# Patient Record
Sex: Female | Born: 1985
Health system: Southern US, Community
[De-identification: ages and names within clinical notes are randomized; demographics above are authoritative.]

## PROBLEM LIST (undated history)

## (undated) DIAGNOSIS — R0602 Shortness of breath: Secondary | ICD-10-CM

## (undated) DIAGNOSIS — IMO0002 Reserved for concepts with insufficient information to code with codable children: Secondary | ICD-10-CM

## (undated) DIAGNOSIS — M549 Dorsalgia, unspecified: Secondary | ICD-10-CM

## (undated) DIAGNOSIS — R600 Localized edema: Secondary | ICD-10-CM

## (undated) DIAGNOSIS — E039 Hypothyroidism, unspecified: Secondary | ICD-10-CM

## (undated) DIAGNOSIS — K219 Gastro-esophageal reflux disease without esophagitis: Secondary | ICD-10-CM

## (undated) DIAGNOSIS — R7303 Prediabetes: Secondary | ICD-10-CM

## (undated) DIAGNOSIS — A549 Gonococcal infection, unspecified: Secondary | ICD-10-CM

## (undated) DIAGNOSIS — M1990 Unspecified osteoarthritis, unspecified site: Secondary | ICD-10-CM

## (undated) DIAGNOSIS — F419 Anxiety disorder, unspecified: Secondary | ICD-10-CM

## (undated) DIAGNOSIS — I10 Essential (primary) hypertension: Secondary | ICD-10-CM

## (undated) DIAGNOSIS — R002 Palpitations: Secondary | ICD-10-CM

## (undated) DIAGNOSIS — K589 Irritable bowel syndrome without diarrhea: Secondary | ICD-10-CM

## (undated) DIAGNOSIS — F1010 Alcohol abuse, uncomplicated: Secondary | ICD-10-CM

## (undated) DIAGNOSIS — E559 Vitamin D deficiency, unspecified: Secondary | ICD-10-CM

## (undated) DIAGNOSIS — F32A Depression, unspecified: Secondary | ICD-10-CM

## (undated) DIAGNOSIS — E7849 Other hyperlipidemia: Secondary | ICD-10-CM

## (undated) DIAGNOSIS — F1910 Other psychoactive substance abuse, uncomplicated: Secondary | ICD-10-CM

## (undated) HISTORY — DX: Other hyperlipidemia: E78.49

## (undated) HISTORY — DX: Dorsalgia, unspecified: M54.9

## (undated) HISTORY — DX: Gastro-esophageal reflux disease without esophagitis: K21.9

## (undated) HISTORY — DX: Depression, unspecified: F32.A

## (undated) HISTORY — DX: Shortness of breath: R06.02

## (undated) HISTORY — DX: Essential (primary) hypertension: I10

## (undated) HISTORY — DX: Prediabetes: R73.03

## (undated) HISTORY — DX: Anxiety disorder, unspecified: F41.9

## (undated) HISTORY — DX: Vitamin D deficiency, unspecified: E55.9

## (undated) HISTORY — DX: Other psychoactive substance abuse, uncomplicated: F19.10

## (undated) HISTORY — DX: Irritable bowel syndrome, unspecified: K58.9

## (undated) HISTORY — DX: Palpitations: R00.2

## (undated) HISTORY — PX: OTHER SURGICAL HISTORY: SHX169

## (undated) HISTORY — DX: Alcohol abuse, uncomplicated: F10.10

## (undated) HISTORY — DX: Localized edema: R60.0

---
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
## 2005-03-31 DIAGNOSIS — A549 Gonococcal infection, unspecified: Secondary | ICD-10-CM

## 2005-03-31 HISTORY — DX: Gonococcal infection, unspecified: A54.9

## 2005-05-26 ENCOUNTER — Inpatient Hospital Stay (HOSPITAL_COMMUNITY): Admission: AD | Admit: 2005-05-26 | Discharge: 2005-05-26 | Payer: Self-pay | Admitting: Obstetrics & Gynecology

## 2005-08-15 ENCOUNTER — Inpatient Hospital Stay (HOSPITAL_COMMUNITY): Admission: AD | Admit: 2005-08-15 | Discharge: 2005-08-15 | Payer: Self-pay | Admitting: Obstetrics and Gynecology

## 2005-09-18 ENCOUNTER — Inpatient Hospital Stay (HOSPITAL_COMMUNITY): Admission: AD | Admit: 2005-09-18 | Discharge: 2005-09-18 | Payer: Self-pay | Admitting: Obstetrics and Gynecology

## 2005-10-19 ENCOUNTER — Inpatient Hospital Stay (HOSPITAL_COMMUNITY): Admission: AD | Admit: 2005-10-19 | Discharge: 2005-10-19 | Payer: Self-pay | Admitting: Obstetrics & Gynecology

## 2005-10-22 ENCOUNTER — Inpatient Hospital Stay (HOSPITAL_COMMUNITY): Admission: AD | Admit: 2005-10-22 | Discharge: 2005-10-22 | Payer: Self-pay | Admitting: Obstetrics and Gynecology

## 2005-11-14 ENCOUNTER — Inpatient Hospital Stay (HOSPITAL_COMMUNITY): Admission: AD | Admit: 2005-11-14 | Discharge: 2005-11-16 | Payer: Self-pay | Admitting: Obstetrics and Gynecology

## 2007-01-29 ENCOUNTER — Emergency Department (HOSPITAL_COMMUNITY): Admission: EM | Admit: 2007-01-29 | Discharge: 2007-01-29 | Payer: Self-pay | Admitting: Emergency Medicine

## 2009-08-31 ENCOUNTER — Encounter: Admission: RE | Admit: 2009-08-31 | Discharge: 2009-08-31 | Payer: Self-pay | Admitting: Family Medicine

## 2010-08-16 NOTE — Procedures (Signed)
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  The second was 2  weeks ago when a friend of hers had her tongue pierced.  Both episodes were  associated with urinary incontinence and stiffening and lasted for up to 6  minutes.   PROCEDURE:  The patient is carried out of 32-channel digital [place]well  recorder reformatted to 16-channel montages with one devoted to EKG.  The  patient was awake and asleep during the recording.  The International 10/20  system lead placement was used.   FINDINGS:  Dominant frequency is well-modulated and regulated 10 Hz activity  of 15-20 mcV that  is broadly distributed and attenuates partially with eye  opening.   Background activity shows a mixture of predominantly alpha and beta range  activity when the patient is  awake. The patient becomes drowsy with mixed  frequency theta and upper delta range activity that predominates.  Light  natural sleep as achieved with vertex sharp waves and symmetric and  synchronous sleep spindles.   There was no focal slowing.  There was no interictal epileptiform activity  in the form of spikes or sharp waves.  Activating procedures with  intermittent photic stimulation induced to sustained driving response.  Hyperventilation caused mild potentiation in background.  There was no  seizure activity in this record.  EKG showed regular sinus rhythm with  ventricular response of 78 beats per minute.   IMPRESSION:  Normal record with the patient awake and asleep.      VQQ:VZDG  D:  05/17/2004 00:05:28  T:  05/17/2004 10:18:23  Job #:  [id number]   cc:   Genene Churn. Love, M.D.  [address]  [address]  [place]  [place] [address]  Fax: [phone]

## 2011-03-19 ENCOUNTER — Other Ambulatory Visit (HOSPITAL_COMMUNITY): Payer: Self-pay | Admitting: Obstetrics and Gynecology

## 2011-03-19 DIAGNOSIS — O99210 Obesity complicating pregnancy, unspecified trimester: Secondary | ICD-10-CM

## 2011-04-01 NOTE — L&D Delivery Note (Signed)
Delivery Note At 6:04 PM a viable female was delivered via Vaginal, Spontaneous Delivery (Presentation: Right Occiput Anterior).  APGAR: 9, 9; weight .   Placenta status: Intact, Spontaneous.  Cord: 3 vessels   Pt quickly progressed in labor.  With about 15 minutes of push the patient delivered a vigorous female infant in the vertex presentation. The Infant was passed to the maternal abdomen and cried vigorously.  The placenta delivered spontaneously, intact with 3V. No lacerations required repair.   Anesthesia: Epidural  Episiotomy: None Lacerations: None Est. Blood Loss (mL): 300 cc   Mom to AICU.  Baby to nursery-stable.  Angela Moncayo H. 08/27/2011, 7:17 PM

## 2011-04-03 ENCOUNTER — Encounter (HOSPITAL_COMMUNITY): Payer: Self-pay

## 2011-04-03 ENCOUNTER — Ambulatory Visit (HOSPITAL_COMMUNITY)
Admission: RE | Admit: 2011-04-03 | Discharge: 2011-04-03 | Disposition: A | Discharge status: Home / Self Care | Payer: 59 | Source: Ambulatory Visit | Attending: Obstetrics and Gynecology | Admitting: Obstetrics and Gynecology

## 2011-04-03 DIAGNOSIS — Z1389 Encounter for screening for other disorder: Secondary | ICD-10-CM | POA: Insufficient documentation

## 2011-04-03 DIAGNOSIS — O358XX0 Maternal care for other (suspected) fetal abnormality and damage, not applicable or unspecified: Secondary | ICD-10-CM | POA: Insufficient documentation

## 2011-04-03 DIAGNOSIS — O99210 Obesity complicating pregnancy, unspecified trimester: Secondary | ICD-10-CM

## 2011-04-03 DIAGNOSIS — Z363 Encounter for antenatal screening for malformations: Secondary | ICD-10-CM | POA: Insufficient documentation

## 2011-04-03 DIAGNOSIS — E079 Disorder of thyroid, unspecified: Secondary | ICD-10-CM | POA: Insufficient documentation

## 2011-04-03 DIAGNOSIS — E669 Obesity, unspecified: Secondary | ICD-10-CM | POA: Insufficient documentation

## 2011-04-03 DIAGNOSIS — E039 Hypothyroidism, unspecified: Secondary | ICD-10-CM | POA: Insufficient documentation

## 2011-04-03 DIAGNOSIS — O99280 Endocrine, nutritional and metabolic diseases complicating pregnancy, unspecified trimester: Secondary | ICD-10-CM | POA: Insufficient documentation

## 2011-04-03 HISTORY — DX: Hypothyroidism, unspecified: E03.9

## 2011-04-03 HISTORY — DX: Irritable bowel syndrome without diarrhea: K58.9

## 2011-04-03 NOTE — Progress Notes (Signed)
Obstetric ultrasound performed today.  Please see report in [facility]. 

## 2011-04-07 ENCOUNTER — Other Ambulatory Visit: Payer: Self-pay

## 2011-05-15 ENCOUNTER — Ambulatory Visit (HOSPITAL_COMMUNITY): Payer: 59

## 2011-05-16 ENCOUNTER — Ambulatory Visit (HOSPITAL_COMMUNITY)
Admission: RE | Admit: 2011-05-16 | Discharge: 2011-05-16 | Disposition: A | Discharge status: Home / Self Care | Payer: 59 | Source: Ambulatory Visit | Attending: Obstetrics and Gynecology | Admitting: Obstetrics and Gynecology

## 2011-05-16 VITALS — BP 129/62 | HR 81 | Wt 275.0 lb

## 2011-05-16 DIAGNOSIS — E039 Hypothyroidism, unspecified: Secondary | ICD-10-CM | POA: Insufficient documentation

## 2011-05-16 DIAGNOSIS — O99210 Obesity complicating pregnancy, unspecified trimester: Secondary | ICD-10-CM

## 2011-05-16 DIAGNOSIS — E669 Obesity, unspecified: Secondary | ICD-10-CM | POA: Insufficient documentation

## 2011-05-16 DIAGNOSIS — E079 Disorder of thyroid, unspecified: Secondary | ICD-10-CM | POA: Insufficient documentation

## 2011-05-16 NOTE — Progress Notes (Signed)
Obstetric ultrasound performed today.    IUP at 24 weeks 3 days Appropriate interval growth Reassuring interval anatomy (some limited cardiac views) Normal amniotic fluid volume Resolved low lying placenta  Repeat ultrasound scheduled in 6 weeks to re evaluate fetal growth, anatomy, and amniotic fluid volume.

## 2011-06-27 ENCOUNTER — Ambulatory Visit (HOSPITAL_COMMUNITY)
Admission: RE | Admit: 2011-06-27 | Discharge: 2011-06-27 | Disposition: A | Discharge status: Home / Self Care | Payer: 59 | Source: Ambulatory Visit | Attending: Obstetrics and Gynecology | Admitting: Obstetrics and Gynecology

## 2011-06-27 VITALS — BP 118/54 | HR 89 | Wt 282.0 lb

## 2011-06-27 DIAGNOSIS — E039 Hypothyroidism, unspecified: Secondary | ICD-10-CM | POA: Insufficient documentation

## 2011-06-27 DIAGNOSIS — E079 Disorder of thyroid, unspecified: Secondary | ICD-10-CM | POA: Insufficient documentation

## 2011-06-27 DIAGNOSIS — O99210 Obesity complicating pregnancy, unspecified trimester: Secondary | ICD-10-CM

## 2011-06-27 DIAGNOSIS — E669 Obesity, unspecified: Secondary | ICD-10-CM | POA: Insufficient documentation

## 2011-06-27 NOTE — Progress Notes (Signed)
Obstetric ultrasound performed today.   IUP at 30 weeks 3 days Appropriate interval growth Reassuring interval anatomy (some limited cardiac views) Normal amniotic fluid volume  Please see full report in [facility].

## 2011-08-08 ENCOUNTER — Ambulatory Visit (HOSPITAL_COMMUNITY)
Admission: RE | Admit: 2011-08-08 | Discharge: 2011-08-08 | Disposition: A | Discharge status: Home / Self Care | Payer: 59 | Source: Ambulatory Visit | Attending: Obstetrics and Gynecology | Admitting: Obstetrics and Gynecology

## 2011-08-08 VITALS — BP 130/76 | HR 79 | Wt 294.0 lb

## 2011-08-08 DIAGNOSIS — E039 Hypothyroidism, unspecified: Secondary | ICD-10-CM | POA: Insufficient documentation

## 2011-08-08 DIAGNOSIS — O99210 Obesity complicating pregnancy, unspecified trimester: Secondary | ICD-10-CM

## 2011-08-08 DIAGNOSIS — E669 Obesity, unspecified: Secondary | ICD-10-CM | POA: Insufficient documentation

## 2011-08-08 DIAGNOSIS — O99280 Endocrine, nutritional and metabolic diseases complicating pregnancy, unspecified trimester: Secondary | ICD-10-CM | POA: Insufficient documentation

## 2011-08-08 DIAGNOSIS — E079 Disorder of thyroid, unspecified: Secondary | ICD-10-CM | POA: Insufficient documentation

## 2011-08-08 NOTE — Progress Notes (Signed)
Patient seen today  for follow up ultrasound.  See full report in [facility].  Alpha Gula, MD  IUP at 36 3/7 weeks The estimated fetal weight today is 3393 g (>90th percentile) Normal amniotic fluid volume  Recommend follow up as clinically indicated

## 2011-08-23 ENCOUNTER — Other Ambulatory Visit (HOSPITAL_COMMUNITY): Payer: Self-pay | Admitting: Obstetrics & Gynecology

## 2011-08-23 ENCOUNTER — Other Ambulatory Visit (HOSPITAL_COMMUNITY): Payer: Self-pay | Admitting: *Deleted

## 2011-08-23 DIAGNOSIS — O139 Gestational [pregnancy-induced] hypertension without significant proteinuria, unspecified trimester: Secondary | ICD-10-CM

## 2011-08-23 LAB — COMPREHENSIVE METABOLIC PANEL
ALT: 11 U/L (ref 0–35)
AST: 17 U/L (ref 0–37)
Albumin: 2.6 g/dL — ABNORMAL LOW (ref 3.5–5.2)
CO2: 23 mEq/L (ref 19–32)
Calcium: 9.4 mg/dL (ref 8.4–10.5)
Chloride: 101 mEq/L (ref 96–112)
Creatinine, Ser: 0.65 mg/dL (ref 0.50–1.10)
GFR calc non Af Amer: >90 mL/min (ref >90)
Sodium: 135 mEq/L (ref 135–145)
Total Bilirubin: 0.2 mg/dL — ABNORMAL LOW (ref 0.3–1.2)

## 2011-08-23 LAB — CBC
Platelets: 178 10*3/uL (ref 150–400)
RBC: 4.01 MIL/uL (ref 3.87–5.11)
RDW: 13.1 % (ref 11.5–15.5)
WBC: 9.4 10*3/uL (ref 4.0–10.5)

## 2011-08-27 ENCOUNTER — Inpatient Hospital Stay (HOSPITAL_COMMUNITY)
Admission: AD | Admit: 2011-08-27 | Discharge: 2011-08-29 | DRG: 774 | Disposition: A | Discharge status: Home / Self Care | Payer: 59 | Source: Ambulatory Visit | Attending: Obstetrics and Gynecology | Admitting: Obstetrics and Gynecology

## 2011-08-27 ENCOUNTER — Encounter (HOSPITAL_COMMUNITY): Payer: Self-pay | Admitting: Anesthesiology

## 2011-08-27 ENCOUNTER — Encounter (HOSPITAL_COMMUNITY): Payer: Self-pay | Admitting: *Deleted

## 2011-08-27 ENCOUNTER — Inpatient Hospital Stay (HOSPITAL_COMMUNITY): Payer: 59 | Admitting: Anesthesiology

## 2011-08-27 DIAGNOSIS — E669 Obesity, unspecified: Secondary | ICD-10-CM | POA: Diagnosis present

## 2011-08-27 DIAGNOSIS — IMO0002 Reserved for concepts with insufficient information to code with codable children: Principal | ICD-10-CM | POA: Diagnosis present

## 2011-08-27 DIAGNOSIS — O1490 Unspecified pre-eclampsia, unspecified trimester: Secondary | ICD-10-CM

## 2011-08-27 DIAGNOSIS — O99214 Obesity complicating childbirth: Secondary | ICD-10-CM | POA: Diagnosis present

## 2011-08-27 DIAGNOSIS — E039 Hypothyroidism, unspecified: Secondary | ICD-10-CM | POA: Diagnosis present

## 2011-08-27 DIAGNOSIS — E079 Disorder of thyroid, unspecified: Secondary | ICD-10-CM | POA: Diagnosis present

## 2011-08-27 HISTORY — DX: Gonococcal infection, unspecified: A54.9

## 2011-08-27 HISTORY — DX: Unspecified osteoarthritis, unspecified site: M19.90

## 2011-08-27 HISTORY — DX: Reserved for concepts with insufficient information to code with codable children: IMO0002

## 2011-08-27 LAB — COMPREHENSIVE METABOLIC PANEL
ALT: 15 U/L (ref 0–35)
AST: 20 U/L (ref 0–37)
Alkaline Phosphatase: 117 U/L (ref 39–117)
CO2: 25 mEq/L (ref 19–32)
Calcium: 9 mg/dL (ref 8.4–10.5)
Chloride: 99 mEq/L (ref 96–112)
GFR calc non Af Amer: >90 mL/min (ref >90)
Potassium: 4 mEq/L (ref 3.5–5.1)
Sodium: 133 mEq/L — ABNORMAL LOW (ref 135–145)
Total Bilirubin: 0.3 mg/dL (ref 0.3–1.2)

## 2011-08-27 LAB — URINALYSIS, ROUTINE W REFLEX MICROSCOPIC
Bilirubin Urine: NEGATIVE
Specific Gravity, Urine: >1.03 — ABNORMAL HIGH (ref 1.005–1.030)
Urobilinogen, UA: 0.2 mg/dL (ref 0.0–1.0)

## 2011-08-27 LAB — CBC
HCT: 36.3 % (ref 36.0–46.0)
MCH: 29.1 pg (ref 26.0–34.0)
MCHC: 33.6 g/dL (ref 30.0–36.0)
MCV: 86.6 fL (ref 78.0–100.0)
RDW: 13.3 % (ref 11.5–15.5)

## 2011-08-27 LAB — URINE MICROSCOPIC-ADD ON

## 2011-08-27 LAB — OB RESULTS CONSOLE HIV ANTIBODY (ROUTINE TESTING): HIV: NONREACTIVE

## 2011-08-27 LAB — URIC ACID: Uric Acid, Serum: 6 mg/dL (ref 2.4–7.0)

## 2011-08-27 LAB — OB RESULTS CONSOLE GC/CHLAMYDIA
Chlamydia: NEGATIVE
Gonorrhea: NEGATIVE

## 2011-08-27 LAB — OB RESULTS CONSOLE ABO/RH: RH Type: POSITIVE

## 2011-08-27 LAB — OB RESULTS CONSOLE HEPATITIS B SURFACE ANTIGEN: Hepatitis B Surface Ag: NEGATIVE

## 2011-08-27 LAB — ABO/RH: ABO/RH(D): A POS

## 2011-08-27 MED ORDER — LIDOCAINE HCL (PF) 1 % IJ SOLN
INTRAMUSCULAR | Status: DC | PRN
  Administered 2011-08-27 (×2): 8 mL

## 2011-08-27 MED ORDER — TETANUS-DIPHTH-ACELL PERTUSSIS 5-2.5-18.5 LF-MCG/0.5 IM SUSP
0.5000 mL | Freq: Once | INTRAMUSCULAR | Status: AC
  Administered 2011-08-28: 0.5 mL via INTRAMUSCULAR
  Filled 2011-08-27: qty 0.5

## 2011-08-27 MED ORDER — LANOLIN HYDROUS EX OINT: TOPICAL_OINTMENT | CUTANEOUS | Status: DC | PRN

## 2011-08-27 MED ORDER — IBUPROFEN 600 MG PO TABS: 600.0000 mg | ORAL_TABLET | Freq: Four times a day (QID) | ORAL | Status: DC | PRN

## 2011-08-27 MED ORDER — FENTANYL 2.5 MCG/ML BUPIVACAINE 1/10 % EPIDURAL INFUSION (WH - ANES)
14.0000 mL/h | INTRAMUSCULAR | Status: DC
  Filled 2011-08-27: qty 60

## 2011-08-27 MED ORDER — DIPHENHYDRAMINE HCL 25 MG PO CAPS: 25.0000 mg | ORAL_CAPSULE | Freq: Four times a day (QID) | ORAL | Status: DC | PRN

## 2011-08-27 MED ORDER — SENNOSIDES-DOCUSATE SODIUM 8.6-50 MG PO TABS: 2.0000 | ORAL_TABLET | Freq: Every day | ORAL | Status: DC

## 2011-08-27 MED ORDER — ONDANSETRON HCL 4 MG PO TABS: 4.0000 mg | ORAL_TABLET | ORAL | Status: DC | PRN

## 2011-08-27 MED ORDER — CITRIC ACID-SODIUM CITRATE 334-500 MG/5ML PO SOLN: 30.0000 mL | ORAL | Status: DC | PRN

## 2011-08-27 MED ORDER — DIPHENHYDRAMINE HCL 50 MG/ML IJ SOLN: 12.5000 mg | INTRAMUSCULAR | Status: DC | PRN

## 2011-08-27 MED ORDER — FENTANYL 2.5 MCG/ML BUPIVACAINE 1/10 % EPIDURAL INFUSION (WH - ANES)
INTRAMUSCULAR | Status: DC | PRN
  Administered 2011-08-27: 12 mL/h via EPIDURAL

## 2011-08-27 MED ORDER — PHENYLEPHRINE 40 MCG/ML (10ML) SYRINGE FOR IV PUSH (FOR BLOOD PRESSURE SUPPORT)
80.0000 ug | PREFILLED_SYRINGE | INTRAVENOUS | Status: DC | PRN
  Filled 2011-08-27: qty 5

## 2011-08-27 MED ORDER — OXYTOCIN 20 UNITS IN LACTATED RINGERS INFUSION - SIMPLE
1.0000 m[IU]/min | INTRAVENOUS | Status: DC
  Administered 2011-08-27: 2 m[IU]/min via INTRAVENOUS
  Filled 2011-08-27: qty 1000

## 2011-08-27 MED ORDER — LACTATED RINGERS IV SOLN: 500.0000 mL | Freq: Once | INTRAVENOUS | Status: DC

## 2011-08-27 MED ORDER — OXYTOCIN 20 UNITS IN LACTATED RINGERS INFUSION - SIMPLE
125.0000 mL/h | Freq: Once | INTRAVENOUS | Status: DC
  Administered 2011-08-27: 999 mL/h via INTRAVENOUS

## 2011-08-27 MED ORDER — PRENATAL MULTIVITAMIN CH
1.0000 | ORAL_TABLET | Freq: Every day | ORAL | Status: DC
  Administered 2011-08-28 – 2011-08-29 (×2): 1 via ORAL
  Filled 2011-08-27 (×2): qty 1

## 2011-08-27 MED ORDER — OXYCODONE-ACETAMINOPHEN 5-325 MG PO TABS
1.0000 | ORAL_TABLET | ORAL | Status: DC | PRN
  Administered 2011-08-27 – 2011-08-29 (×3): 1 via ORAL
  Filled 2011-08-27 (×4): qty 1

## 2011-08-27 MED ORDER — EPHEDRINE 5 MG/ML INJ
10.0000 mg | INTRAVENOUS | Status: DC | PRN
  Filled 2011-08-27: qty 4

## 2011-08-27 MED ORDER — BENZOCAINE-MENTHOL 20-0.5 % EX AERO: 1.0000 "application " | INHALATION_SPRAY | CUTANEOUS | Status: DC | PRN

## 2011-08-27 MED ORDER — ZOLPIDEM TARTRATE 5 MG PO TABS: 5.0000 mg | ORAL_TABLET | Freq: Every evening | ORAL | Status: DC | PRN

## 2011-08-27 MED ORDER — TERBUTALINE SULFATE 1 MG/ML IJ SOLN: 0.2500 mg | Freq: Once | INTRAMUSCULAR | Status: DC | PRN

## 2011-08-27 MED ORDER — ONDANSETRON HCL 4 MG/2ML IJ SOLN: 4.0000 mg | Freq: Four times a day (QID) | INTRAMUSCULAR | Status: DC | PRN

## 2011-08-27 MED ORDER — LACTATED RINGERS IV SOLN
500.0000 mL | INTRAVENOUS | Status: DC | PRN
  Administered 2011-08-27: 1000 mL via INTRAVENOUS

## 2011-08-27 MED ORDER — BUTORPHANOL TARTRATE 2 MG/ML IJ SOLN: 1.0000 mg | INTRAMUSCULAR | Status: DC | PRN

## 2011-08-27 MED ORDER — IBUPROFEN 600 MG PO TABS
600.0000 mg | ORAL_TABLET | Freq: Four times a day (QID) | ORAL | Status: DC
  Administered 2011-08-27 – 2011-08-29 (×8): 600 mg via ORAL
  Filled 2011-08-27 (×8): qty 1

## 2011-08-27 MED ORDER — SIMETHICONE 80 MG PO CHEW: 80.0000 mg | CHEWABLE_TABLET | ORAL | Status: DC | PRN

## 2011-08-27 MED ORDER — FLEET ENEMA 7-19 GM/118ML RE ENEM: 1.0000 | ENEMA | RECTAL | Status: DC | PRN

## 2011-08-27 MED ORDER — MAGNESIUM SULFATE BOLUS VIA INFUSION
6.0000 g | Freq: Once | INTRAVENOUS | Status: DC
  Filled 2011-08-27: qty 500

## 2011-08-27 MED ORDER — MAGNESIUM SULFATE 40 G IN LACTATED RINGERS - SIMPLE
2.0000 g/h | INTRAVENOUS | Status: DC
  Administered 2011-08-27: 6 g/h via INTRAVENOUS
  Administered 2011-08-28: 2 g/h via INTRAVENOUS
  Filled 2011-08-27 (×2): qty 500

## 2011-08-27 MED ORDER — WITCH HAZEL-GLYCERIN EX PADS: 1.0000 "application " | MEDICATED_PAD | CUTANEOUS | Status: DC | PRN

## 2011-08-27 MED ORDER — LACTATED RINGERS IV SOLN
INTRAVENOUS | Status: DC
  Administered 2011-08-28 (×2): via INTRAVENOUS

## 2011-08-27 MED ORDER — ZOLPIDEM TARTRATE 10 MG PO TABS: 10.0000 mg | ORAL_TABLET | Freq: Every evening | ORAL | Status: DC | PRN

## 2011-08-27 MED ORDER — DIBUCAINE 1 % RE OINT: 1.0000 "application " | TOPICAL_OINTMENT | RECTAL | Status: DC | PRN

## 2011-08-27 MED ORDER — LORATADINE 10 MG PO TABS
10.0000 mg | ORAL_TABLET | Freq: Every day | ORAL | Status: DC
  Administered 2011-08-28 – 2011-08-29 (×2): 10 mg via ORAL
  Filled 2011-08-27 (×3): qty 1

## 2011-08-27 MED ORDER — ACETAMINOPHEN 325 MG PO TABS: 650.0000 mg | ORAL_TABLET | ORAL | Status: DC | PRN

## 2011-08-27 MED ORDER — ONDANSETRON HCL 4 MG/2ML IJ SOLN: 4.0000 mg | INTRAMUSCULAR | Status: DC | PRN

## 2011-08-27 MED ORDER — LEVOTHYROXINE SODIUM 25 MCG PO TABS
25.0000 ug | ORAL_TABLET | Freq: Every day | ORAL | Status: DC
  Administered 2011-08-28 – 2011-08-29 (×2): 25 ug via ORAL
  Filled 2011-08-27 (×3): qty 1

## 2011-08-27 MED ORDER — PHENYLEPHRINE 40 MCG/ML (10ML) SYRINGE FOR IV PUSH (FOR BLOOD PRESSURE SUPPORT): 80.0000 ug | PREFILLED_SYRINGE | INTRAVENOUS | Status: DC | PRN

## 2011-08-27 MED ORDER — OXYTOCIN BOLUS FROM INFUSION
500.0000 mL | Freq: Once | INTRAVENOUS | Status: DC
  Filled 2011-08-27: qty 500

## 2011-08-27 MED ORDER — EPHEDRINE 5 MG/ML INJ: 10.0000 mg | INTRAVENOUS | Status: DC | PRN

## 2011-08-27 MED ORDER — LACTATED RINGERS IV SOLN
INTRAVENOUS | Status: DC
  Administered 2011-08-27 (×2): via INTRAVENOUS

## 2011-08-27 MED ORDER — LIDOCAINE HCL (PF) 1 % IJ SOLN: 30.0000 mL | INTRAMUSCULAR | Status: DC | PRN

## 2011-08-27 MED ORDER — OXYCODONE-ACETAMINOPHEN 5-325 MG PO TABS: 1.0000 | ORAL_TABLET | ORAL | Status: DC | PRN

## 2011-08-27 NOTE — Progress Notes (Signed)
Patient ID: Angela Johnson, female   DOB: August 30, 1985, 26 y.o.   MRN: [id number]  S: Now comfortable with epidural  O: Filed Vitals:   08/27/11 1649 08/27/11 1651 08/27/11 1656 08/27/11 1701  BP: 153/88 154/88 146/87 147/89  Pulse: 84 83 79 84  Temp:      TempSrc:      Resp:      Height:      Weight:      SpO2:       AOx3 Cvx 5/C/0 toco Q 2 FHT 140 reactive SROM while waiting for epidural  A/P: 1) Cont pit 2) Cont Mag 3) FWB reassuring

## 2011-08-27 NOTE — H&P (Signed)
  CC: Leaking fluid HPI: 26 yo G3P1011 p/w c/o leaking fluid. Upon presentation, the patient was not found to have SROM, however her BPs were elevated.  The patient had been seen over the weekend for elevated BPs, had pre-eclampsia labs performed, and a 24 hour urine.  Labs returned WNL, however the patients 24 hour urine was 570 mg protein.  Given elevated BPs and 24 hour urine results will admit for induction.  Will administer magnesium for seizure prophylaxis. PMH: 1) Obesity 2) Hypothyroidism PSH: None POB: G3P1011  2006: first trimester EAB  2007: 40+ svd 10#2 PGYN: No abn paps, h/o GC 2007 Meds: 1) PNV All: Latex PNL: A+, Rh neg, RPR NR, RI, HepBsAg neg, HIV NR, HgA1C 5.0, 1hr gtt 113, GC neg, Ch neg, GBS neg, declined genetic screening, pap WNL PE:  Filed Vitals:   08/27/11 1134  BP: 160/98  Pulse: 90  Temp:   Resp: 18    AOX3, NAD Gravid, soft, NT FHT 150 reactive Cvx 3-4/70/-2, post Toco: irreg CTAB RRR DTRs 2+, no clonus A/P:  1) Admit 2) Pre-E labs 3) Induction for pre-eclampsia, start magnesium for seizure prophylaxis 6gm load then 2gm q hr 4) Start pitocin, arom when able 5) Epidural on request

## 2011-08-27 NOTE — Anesthesia Procedure Notes (Signed)
Epidural Patient location during procedure: OB Start time: 08/27/2011 4:30 PM End time: 08/27/2011 4:36 PM Reason for block: procedure for pain  Staffing Anesthesiologist: Sandrea Hughs Performed by: anesthesiologist   Preanesthetic Checklist Completed: patient identified, site marked, surgical consent, pre-op evaluation, timeout performed, IV checked, risks and benefits discussed and monitors and equipment checked  Epidural Patient position: sitting Prep: site prepped and draped and DuraPrep Patient monitoring: continuous pulse ox and blood pressure Approach: midline Injection technique: LOR air  Needle:  Needle type: Tuohy  Needle gauge: 17 G Needle length: 9 cm Needle insertion depth: 6 cm Catheter type: closed end flexible Catheter size: 19 Gauge Catheter at skin depth: 12 cm Test dose: negative and Other  Assessment Sensory level: T10 Events: blood not aspirated, injection not painful, no injection resistance, negative IV test and no paresthesia

## 2011-08-27 NOTE — MAU Note (Signed)
BS CN notified of patient status. Will be triaged in 173.

## 2011-08-27 NOTE — Progress Notes (Signed)
Additional rn assistance requested

## 2011-08-27 NOTE — Anesthesia Preprocedure Evaluation (Signed)
Anesthesia Evaluation  Patient identified by MRN, date of birth, ID band Patient awake    Reviewed: Allergy & Precautions, H&P , NPO status , Patient's Chart, lab work & pertinent test results  Airway Mallampati: III TM Distance: >3 FB Neck ROM: full    Dental No notable dental hx.    Pulmonary neg pulmonary ROS,  breath sounds clear to auscultation  Pulmonary exam normal       Cardiovascular negative cardio ROS      Neuro/Psych negative neurological ROS  negative psych ROS   GI/Hepatic negative GI ROS, Neg liver ROS,   Endo/Other  Hypothyroidism Morbid obesity  Renal/GU negative Renal ROS  negative genitourinary   Musculoskeletal negative musculoskeletal ROS (+)   Abdominal (+) + obese,   Peds negative pediatric ROS (+)  Hematology negative hematology ROS (+)   Anesthesia Other Findings   Reproductive/Obstetrics (+) Pregnancy                           Anesthesia Physical Anesthesia Plan  ASA: III  Anesthesia Plan: Epidural   Post-op Pain Management:    Induction:   Airway Management Planned:   Additional Equipment:   Intra-op Plan:   Post-operative Plan:   Informed Consent: I have reviewed the patients History and Physical, chart, labs and discussed the procedure including the risks, benefits and alternatives for the proposed anesthesia with the patient or authorized representative who has indicated his/her understanding and acceptance.     Plan Discussed with:   Anesthesia Plan Comments:         Anesthesia Quick Evaluation

## 2011-08-27 NOTE — Progress Notes (Signed)
efm d/cd per anesthesia  

## 2011-08-27 NOTE — MAU Note (Signed)
Patient states she started leaking a small amount of clear fluid at 0740. Has continued to leak clear fluid. Having contractions 11-15 minutes apart. Reports good fetal movement, no bleeding.

## 2011-08-28 LAB — COMPREHENSIVE METABOLIC PANEL
ALT: 10 U/L (ref 0–35)
AST: 17 U/L (ref 0–37)
Alkaline Phosphatase: 101 U/L (ref 39–117)
CO2: 25 mEq/L (ref 19–32)
Chloride: 102 mEq/L (ref 96–112)
GFR calc non Af Amer: >90 mL/min (ref >90)
Sodium: 135 mEq/L (ref 135–145)
Total Bilirubin: 0.2 mg/dL — ABNORMAL LOW (ref 0.3–1.2)

## 2011-08-28 LAB — CBC
Hemoglobin: 9.6 g/dL — ABNORMAL LOW (ref 12.0–15.0)
Hemoglobin: 9.6 g/dL — ABNORMAL LOW (ref 12.0–15.0)
MCHC: 32.5 g/dL (ref 30.0–36.0)
Platelets: 158 10*3/uL (ref 150–400)
RBC: 3.35 MIL/uL — ABNORMAL LOW (ref 3.87–5.11)
RDW: 13.5 % (ref 11.5–15.5)
WBC: 14.1 10*3/uL — ABNORMAL HIGH (ref 4.0–10.5)
WBC: 11.1 10*3/uL — ABNORMAL HIGH (ref 4.0–10.5)

## 2011-08-28 MED ORDER — LABETALOL HCL 5 MG/ML IV SOLN
10.0000 mg | INTRAVENOUS | Status: DC | PRN
  Filled 2011-08-28: qty 4

## 2011-08-28 NOTE — Progress Notes (Signed)
UR chart review completed.  

## 2011-08-28 NOTE — Anesthesia Postprocedure Evaluation (Signed)
  Anesthesia Post-op Note  Patient: Angela Johnson  Procedure(s) Performed: * No procedures listed *  Patient Location: A-ICU  Anesthesia Type: Epidural  Level of Consciousness: awake  Airway and Oxygen Therapy: Patient connected to nasal cannula oxygen  Post-op Pain: none  Post-op Assessment: Post-op Vital signs reviewed, PATIENT'S CARDIOVASCULAR STATUS UNSTABLE, Respiratory Function Stable, Patent Airway, No signs of Nausea or vomiting, Adequate PO intake, Pain level controlled, No headache, No backache, No residual numbness and No residual motor weakness  Post-op Vital Signs: Reviewed and stable  Complications: No apparent anesthesia complications

## 2011-08-28 NOTE — Progress Notes (Signed)
Report called to Tammy, RN on [facility]. Pt to room 141 via wheel chair with E. Toms, Charity fundraiser.

## 2011-08-29 NOTE — Discharge Summary (Signed)
Obstetric Discharge Summary Reason for Admission: induction of labor, preeclampsia Prenatal Procedures: ultrasound Intrapartum Procedures: spontaneous vaginal delivery Postpartum Procedures: none Complications-Operative and Postpartum: none Hemoglobin  Date Value Range Status  08/28/2011 9.6* 12.0-15.0 (g/dL) Final     HCT  Date Value Range Status  08/28/2011 29.2* 36.0-46.0 (%) Final    Physical Exam:  General: alert Lochia: appropriate Uterine Fundus: firm  Discharge Diagnoses: Term Pregnancy-delivered and Preelampsia  Discharge Information: Date: 08/29/2011 Activity: pelvic rest Diet: routine Medications: PNV and Ibuprofen Condition: improved Instructions: refer to practice specific booklet Discharge to: home Follow-up Information    Follow up with Almon Hercules., MD in 4 weeks.   Contact information:   [address] [place] [address] [phone]          Newborn Data: Live born female  Birth Weight: 7 lb 14.1 oz (3575 g) APGAR: 9, 9  Home with mother.  Lavert Matousek D 08/29/2011, 10:59 AM

## 2011-08-29 NOTE — Discharge Instructions (Signed)
Vaginal Delivery Care After  Change your pad on each trip to the bathroom.   Wipe gently with toilet paper during your hospital stay. Always wipe from front to back. A spray bottle with warm tap water could also be used or a towelette if available.   Place your soiled pad and toilet paper in a bathroom wastebasket with a plastic bag liner.   During your hospital stay, save any clots. If you pass a clot while on the toilet, do not flush it. Also, if your vaginal flow seems excessive to you, notify nursing personnel.   The first time you get out of bed after delivery, wait for assistance from a nurse. Do not get up alone at any time if you feel weak or dizzy.   Bend and extend your ankles forcefully so that you feel the calves of your legs get hard. Do this 6 times every hour when you are in bed and awake.   Do not sit with one foot under you, dangle your legs over the edge of the bed, or maintain a position that hinders the circulation in your legs.   Many women experience after pains for 2 to 3 days after delivery. These after pains are mild uterine contractions. Ask the nurse for a pain medication if you need something for this. Sometimes breastfeeding stimulates after pains; if you find this to be true, ask for the medication  -  hour before the next feeding.   For you and your infant's protection, do not go beyond the door(s) of the obstetric unit. Do not carry your baby in your arms in the hallway. When taking your baby to and from your room, put your baby in the bassinet and push the bassinet.   Mothers may have their babies in their room as much as they desire.  Document Released: 03/14/2000 Document Revised: 03/06/2011 Document Reviewed: 02/12/2007 ExitCare Patient Information 2012 [employer]. 

## 2012-07-05 ENCOUNTER — Telehealth: Payer: Self-pay | Admitting: Nurse Practitioner

## 2012-07-05 NOTE — Telephone Encounter (Signed)
Patient aware.

## 2012-10-12 ENCOUNTER — Other Ambulatory Visit (INDEPENDENT_AMBULATORY_CARE_PROVIDER_SITE_OTHER): Payer: 59

## 2012-10-12 DIAGNOSIS — E039 Hypothyroidism, unspecified: Secondary | ICD-10-CM

## 2012-10-12 LAB — THYROID PANEL WITH TSH
Free Thyroxine Index: 2.7 (ref 1.0–3.9)
T3 Uptake: 24.4 % (ref 22.5–37.0)
T4, Total: 10.9 ug/dL (ref 5.0–12.5)
TSH: 3.958 u[IU]/mL (ref 0.350–4.500)

## 2012-11-18 ENCOUNTER — Other Ambulatory Visit: Payer: Self-pay

## 2012-11-18 MED ORDER — LEVOTHYROXINE SODIUM 25 MCG PO TABS: 25.0000 ug | ORAL_TABLET | Freq: Every day | ORAL | Status: DC

## 2012-12-06 ENCOUNTER — Ambulatory Visit (INDEPENDENT_AMBULATORY_CARE_PROVIDER_SITE_OTHER): Payer: BC Managed Care – PPO | Admitting: Family Medicine

## 2012-12-06 ENCOUNTER — Telehealth: Payer: Self-pay | Admitting: Nurse Practitioner

## 2012-12-06 ENCOUNTER — Encounter: Payer: Self-pay | Admitting: Family Medicine

## 2012-12-06 VITALS — BP 137/84 | HR 73 | Temp 97.2°F | Ht 70.0 in | Wt 293.0 lb

## 2012-12-06 DIAGNOSIS — J019 Acute sinusitis, unspecified: Secondary | ICD-10-CM

## 2012-12-06 DIAGNOSIS — J3489 Other specified disorders of nose and nasal sinuses: Secondary | ICD-10-CM

## 2012-12-06 DIAGNOSIS — J029 Acute pharyngitis, unspecified: Secondary | ICD-10-CM

## 2012-12-06 LAB — POCT RAPID STREP A (OFFICE): Rapid Strep A Screen: NEGATIVE

## 2012-12-06 MED ORDER — AMOXICILLIN 500 MG PO CAPS: ORAL_CAPSULE | ORAL | Status: DC

## 2012-12-06 NOTE — Progress Notes (Signed)
  Subjective:    Patient ID: Angela Johnson, female    DOB: [birth year], 27 y.o.   MRN: [id number]  HPI Patient comes in today with a less than 24-hour complaint of sinus pressure, headache, myalgias, and sore throat. She has also had fever appear   Review of Systems  Constitutional: Positive for fever and chills.  HENT: Positive for ear pain, congestion, sore throat, postnasal drip and sinus pressure.   Eyes: Positive for itching.  Respiratory: Negative.   Cardiovascular: Negative.   Gastrointestinal: Negative.   Endocrine: Negative.   Genitourinary: Negative.   Musculoskeletal: Positive for myalgias.  Skin: Negative.   Allergic/Immunologic: Negative.   Neurological: Positive for headaches.  Hematological: Negative.   Psychiatric/Behavioral: Negative.        Objective:   Physical Exam  Vitals reviewed. Constitutional: She is oriented to person, place, and time. She appears well-developed and well-nourished.  HENT:  Head: Normocephalic and atraumatic.  Right Ear: External ear normal.  Left Ear: External ear normal.  Mouth/Throat: Oropharynx is clear and moist. No oropharyngeal exudate.  Nasal congestion bilaterally  Eyes: Conjunctivae and EOM are normal. Pupils are equal, round, and reactive to light. Right eye exhibits no discharge. Left eye exhibits no discharge. No scleral icterus.  Neck: Normal range of motion. Neck supple. No thyromegaly present.  Anterior cervical tenderness bilaterally  Cardiovascular: Normal rate, regular rhythm and normal heart sounds.  Exam reveals no gallop and no friction rub.   No murmur heard. Pulmonary/Chest: Effort normal and breath sounds normal. No respiratory distress. She has no wheezes. She has no rales.  Lymphadenopathy:    She has no cervical adenopathy.  Neurological: She is alert and oriented to person, place, and time.  Skin: Skin is warm and dry. No rash noted. No erythema.  Psychiatric: She has a normal mood and affect. Her  behavior is normal. Judgment and thought content normal.   Results for orders placed in visit on 12/06/12  POCT RAPID STREP A (OFFICE)      Result Value Range   Rapid Strep A Screen Negative  Negative          Assessment & Plan:  Acute rhinosinusitis - Plan: amoxicillin (AMOXIL) 500 MG capsule  Sore throat - Plan: POCT rapid strep A, Strep A culture, throat, amoxicillin (AMOXIL) 500 MG capsule, CANCELED: Upper Respiratory Culture, Routine  Sinus pressure  Patient Instructions  Take antibiotic regularly Take Tylenol or Advil for aches pains and fever Drink plenty of fluids Get plenty of rest   Nyra Capes MD

## 2012-12-06 NOTE — Patient Instructions (Signed)
Take antibiotic regularly Take Tylenol or Advil for aches pains and fever Drink plenty of fluids Get plenty of rest

## 2012-12-06 NOTE — Telephone Encounter (Signed)
Appt given for today 

## 2012-12-09 LAB — STREP A CULTURE, THROAT

## 2012-12-27 ENCOUNTER — Telehealth: Payer: Self-pay | Admitting: *Deleted

## 2012-12-27 NOTE — Telephone Encounter (Signed)
Message copied by Baltazar Apo on Mon Dec 27, 2012  9:03 AM ------      Message from: Ernestina Penna      Created: Thu Dec 09, 2012  7:23 AM       Strep was isolated but not group A. Continue and complete analog ------

## 2013-01-05 ENCOUNTER — Encounter (INDEPENDENT_AMBULATORY_CARE_PROVIDER_SITE_OTHER): Payer: Self-pay

## 2013-01-05 ENCOUNTER — Ambulatory Visit (INDEPENDENT_AMBULATORY_CARE_PROVIDER_SITE_OTHER): Payer: BC Managed Care – PPO | Admitting: General Practice

## 2013-01-05 ENCOUNTER — Encounter: Payer: Self-pay | Admitting: General Practice

## 2013-01-05 ENCOUNTER — Ambulatory Visit: Payer: BC Managed Care – PPO

## 2013-01-05 ENCOUNTER — Ambulatory Visit (INDEPENDENT_AMBULATORY_CARE_PROVIDER_SITE_OTHER): Payer: BC Managed Care – PPO

## 2013-01-05 ENCOUNTER — Other Ambulatory Visit: Payer: Self-pay | Admitting: General Practice

## 2013-01-05 VITALS — BP 131/83 | HR 82 | Temp 98.8°F | Ht 70.0 in | Wt 295.0 lb

## 2013-01-05 DIAGNOSIS — M25539 Pain in unspecified wrist: Secondary | ICD-10-CM

## 2013-01-05 DIAGNOSIS — M25531 Pain in right wrist: Secondary | ICD-10-CM

## 2013-01-05 DIAGNOSIS — R52 Pain, unspecified: Secondary | ICD-10-CM

## 2013-01-05 NOTE — Progress Notes (Signed)
Patient ID: Angela Johnson, female   DOB: Apr 25, 1985, 27 y.o.   MRN: [id number]  Patient presents today for right arm xray, due to pain after fall yesterday.  [facility] reading (PRIMARY) by Coralie Keens, FNP-C, Right Radial head fracture. Referral to ortho specialist, appointment scheduled for today Patient aware Arm sling declined by patient Coralie Keens, FNP-C

## 2013-05-12 ENCOUNTER — Other Ambulatory Visit: Payer: Self-pay | Admitting: Nurse Practitioner

## 2013-05-12 MED ORDER — OSELTAMIVIR PHOSPHATE 75 MG PO CAPS: 75.0000 mg | ORAL_CAPSULE | Freq: Every day | ORAL | Status: DC

## 2013-11-22 ENCOUNTER — Other Ambulatory Visit: Payer: Self-pay | Admitting: Family Medicine

## 2013-11-24 NOTE — Telephone Encounter (Signed)
Last thyroid 7/14. ntbs

## 2013-11-24 NOTE — Telephone Encounter (Signed)
no more refills without being seen  

## 2013-12-22 ENCOUNTER — Ambulatory Visit (INDEPENDENT_AMBULATORY_CARE_PROVIDER_SITE_OTHER): Payer: BC Managed Care – PPO | Admitting: Family

## 2013-12-22 ENCOUNTER — Encounter: Payer: Self-pay | Admitting: Family

## 2013-12-22 ENCOUNTER — Telehealth: Payer: Self-pay | Admitting: Family

## 2013-12-22 VITALS — BP 115/80 | HR 90 | Temp 97.5°F | Ht 70.0 in | Wt 300.8 lb

## 2013-12-22 DIAGNOSIS — J069 Acute upper respiratory infection, unspecified: Secondary | ICD-10-CM

## 2013-12-22 MED ORDER — AMOXICILLIN 500 MG PO CAPS: 500.0000 mg | ORAL_CAPSULE | Freq: Three times a day (TID) | ORAL | Status: DC

## 2013-12-22 MED ORDER — BENZONATATE 200 MG PO CAPS: 200.0000 mg | ORAL_CAPSULE | Freq: Three times a day (TID) | ORAL | Status: DC | PRN

## 2013-12-22 MED ORDER — AMOXICILLIN 500 MG PO CAPS: 500.0000 mg | ORAL_CAPSULE | Freq: Two times a day (BID) | ORAL | Status: DC

## 2013-12-22 NOTE — Telephone Encounter (Signed)
Notified pt to take medication BID

## 2013-12-22 NOTE — Patient Instructions (Signed)
Upper Respiratory Infection, Adult An upper respiratory infection (URI) is also known as the common cold. It is often caused by a type of germ (virus). Colds are easily spread (contagious). You can pass it to others by kissing, coughing, sneezing, or drinking out of the same glass. Usually, you get better in 1 or 2 weeks.  HOME CARE   Only take medicine as told by your doctor.  Use a warm mist humidifier or breathe in steam from a hot shower.  Drink enough water and fluids to keep your pee (urine) clear or pale yellow.  Get plenty of rest.  Return to work when your temperature is back to normal or as told by your doctor. You may use a face mask and wash your hands to stop your cold from spreading. GET HELP RIGHT AWAY IF:   After the first few days, you feel you are getting worse.  You have questions about your medicine.  You have chills, shortness of breath, or brown or red spit (mucus).  You have yellow or brown snot (nasal discharge) or pain in the face, especially when you bend forward.  You have a fever, puffy (swollen) neck, pain when you swallow, or white spots in the back of your throat.  You have a bad headache, ear pain, sinus pain, or chest pain.  You have a high-pitched whistling sound when you breathe in and out (wheezing).  You have a lasting cough or cough up blood.  You have sore muscles or a stiff neck. MAKE SURE YOU:   Understand these instructions.  Will watch your condition.  Will get help right away if you are not doing well or get worse. Document Released: 09/03/2007 Document Revised: 06/09/2011 Document Reviewed: 06/22/2013 ExitCare Patient Information 2015 [employer]. This information is not intended to replace advice given to you by your health care provider. Make sure you discuss any questions you have with your health care provider.  \ - Take meds as prescribed - Use a cool mist humidifier  -Use saline nose sprays frequently -Saline  irrigations of the nose can be very helpful if done frequently.  * 4X daily for 1 week*  * Use of a nettie pot can be helpful with this. Follow directions with this* -Force fluids -For any cough or congestion  Use plain Mucinex- regular strength or max strength is fine   * Children- consult with Pharmacist for dosing -For fever or aces or pains- take tylenol or ibuprofen appropriate for age and weight.  * for fevers greater than 101 orally you may alternate ibuprofen and tylenol every  3 hours. -Throat lozenges if help   Nahmir Zeidman, FNP   

## 2013-12-22 NOTE — Progress Notes (Signed)
Subjective:    Patient ID: Angela Johnson, female    DOB: 1986-02-17, 28 y.o.   MRN: [id number]  Cough This is a new problem. The current episode started in the past 7 days (Sunday). The problem has been gradually worsening. The problem occurs constantly. The cough is productive of purulent sputum. Associated symptoms include ear pain (left ear), headaches, nasal congestion, postnasal drip, rhinorrhea and wheezing. Pertinent negatives include no chills, ear congestion, fever, hemoptysis, rash, sore throat or shortness of breath. The symptoms are aggravated by lying down. She has tried OTC cough suppressant for the symptoms. The treatment provided mild relief. There is no history of asthma.      Review of Systems  Constitutional: Negative.  Negative for fever and chills.  HENT: Positive for ear pain (left ear), postnasal drip and rhinorrhea. Negative for sore throat.   Eyes: Negative.   Respiratory: Positive for cough and wheezing. Negative for hemoptysis and shortness of breath.   Cardiovascular: Negative.  Negative for palpitations.  Gastrointestinal: Negative.   Endocrine: Negative.   Genitourinary: Negative.   Musculoskeletal: Negative.   Skin: Negative for rash.  Neurological: Positive for headaches.  Hematological: Negative.   Psychiatric/Behavioral: Negative.   All other systems reviewed and are negative.      Objective:   Physical Exam  Vitals reviewed. Constitutional: She is oriented to person, place, and time. She appears well-developed and well-nourished. No distress.  HENT:  Head: Normocephalic and atraumatic.  Right Ear: External ear normal.  Left Ear: External ear normal.  Nasal passage erythemas with mild swelling Oropharynx erythemas   Eyes: Pupils are equal, round, and reactive to light.  Neck: Normal range of motion. Neck supple. No thyromegaly present.  Cardiovascular: Normal rate, regular rhythm, normal heart sounds and intact distal pulses.   No murmur  heard. Pulmonary/Chest: Effort normal and breath sounds normal. No respiratory distress. She has no wheezes.  Abdominal: Soft. Bowel sounds are normal. She exhibits no distension. There is no tenderness.  Musculoskeletal: Normal range of motion. She exhibits no edema and no tenderness.  Neurological: She is alert and oriented to person, place, and time. She has normal reflexes. No cranial nerve deficit.  Skin: Skin is warm and dry.  Psychiatric: She has a normal mood and affect. Her behavior is normal. Judgment and thought content normal.   BP 115/80  Pulse 90  Temp(Src) 97.5 F (36.4 C) (Oral)  Ht  (1.778 m)  Wt 300 lb 12.8 oz (136.442 kg)  BMI 43.16 kg/m2        Assessment & Plan:  1. URI (upper respiratory infection) -- Take meds as prescribed - Use a cool mist humidifier  -Use saline nose sprays frequently -Saline irrigations of the nose can be very helpful if done frequently.  * 4X daily for 1 week*  * Use of a nettie pot can be helpful with this. Follow directions with this* -Force fluids -For any cough or congestion  Use plain Mucinex- regular strength or max strength is fine   * Children- consult with Pharmacist for dosing -For fever or aces or pains- take tylenol or ibuprofen appropriate for age and weight.  * for fevers greater than 101 orally you may alternate ibuprofen and tylenol every  3 hours. -Throat lozenges if help - amoxicillin (AMOXIL) 500 MG capsule; Take 1 capsule (500 mg total) by mouth 3 (three) times daily.  Dispense: 20 capsule; Refill: 0 - benzonatate (TESSALON) 200 MG capsule; Take 1 capsule (200 mg  total) by mouth 3 (three) times daily as needed.  Dispense: 30 capsule; Refill: 1  Jannifer Rodney, FNP

## 2014-01-19 ENCOUNTER — Other Ambulatory Visit: Payer: Self-pay | Admitting: Nurse Practitioner

## 2014-01-20 NOTE — Telephone Encounter (Signed)
done

## 2014-01-30 ENCOUNTER — Encounter: Payer: Self-pay | Admitting: Family

## 2014-03-30 ENCOUNTER — Encounter: Payer: Self-pay | Admitting: Family Medicine

## 2014-03-30 ENCOUNTER — Encounter: Payer: Self-pay | Admitting: *Deleted

## 2014-03-30 ENCOUNTER — Ambulatory Visit (INDEPENDENT_AMBULATORY_CARE_PROVIDER_SITE_OTHER): Payer: BC Managed Care – PPO | Admitting: Family Medicine

## 2014-03-30 VITALS — BP 127/78 | HR 96 | Temp 98.2°F | Ht 70.0 in | Wt 305.0 lb

## 2014-03-30 DIAGNOSIS — Z2089 Contact with and (suspected) exposure to other communicable diseases: Secondary | ICD-10-CM

## 2014-03-30 DIAGNOSIS — Z20818 Contact with and (suspected) exposure to other bacterial communicable diseases: Secondary | ICD-10-CM

## 2014-03-30 DIAGNOSIS — N390 Urinary tract infection, site not specified: Secondary | ICD-10-CM

## 2014-03-30 DIAGNOSIS — R300 Dysuria: Secondary | ICD-10-CM

## 2014-03-30 LAB — POCT URINALYSIS DIPSTICK
Bilirubin, UA: NEGATIVE
Glucose, UA: NEGATIVE
Ketones, UA: NEGATIVE
NITRITE UA: NEGATIVE
PH UA: 6.5
PROTEIN UA: NEGATIVE
Spec Grav, UA: 1.015
UROBILINOGEN UA: NEGATIVE

## 2014-03-30 LAB — POCT UA - MICROSCOPIC ONLY
Bacteria, U Microscopic: NEGATIVE
CASTS, UR, LPF, POC: NEGATIVE
Crystals, Ur, HPF, POC: NEGATIVE
MUCUS UA: NEGATIVE
YEAST UA: NEGATIVE

## 2014-03-30 LAB — POCT RAPID STREP A (OFFICE): Rapid Strep A Screen: NEGATIVE

## 2014-03-30 MED ORDER — PHENAZOPYRIDINE HCL 200 MG PO TABS: 200.0000 mg | ORAL_TABLET | Freq: Three times a day (TID) | ORAL | Status: DC | PRN

## 2014-03-30 MED ORDER — CIPROFLOXACIN HCL 500 MG PO TABS: 500.0000 mg | ORAL_TABLET | Freq: Two times a day (BID) | ORAL | Status: DC

## 2014-03-30 NOTE — Patient Instructions (Signed)
Drink plenty of fluids Take medication for bladder as directed Take Tylenol as needed for aches pains and fever

## 2014-03-30 NOTE — Progress Notes (Signed)
Subjective:    Patient ID: Angela Johnson, female    DOB: 08/22/1985, 28 y.o.   MRN: [id number]  HPI Patient here today for possible UTI. She is having frequency and dysuria. The patient is having chills lower abdominal pain and dysuria. She is also has some myalgias. The patient is also concerned that her brother has strep and she was exposed to him this weekend. She does not have a sore throat or cough. Her symptoms or bladder-related but she does have the chills and myalgias.  There are no active problems to display for this patient.  Outpatient Encounter Prescriptions as of 03/30/2014  Medication Sig  . cetirizine (ZYRTEC) 10 MG tablet Take 10 mg by mouth daily as needed. allergies  . levothyroxine (SYNTHROID, LEVOTHROID) 25 MCG tablet TAKE 1 TABLET (25 MCG TOTAL) BY MOUTH DAILY.  . norgestrel-ethinyl estradiol (LO/OVRAL,CRYSELLE) 0.3-30 MG-MCG tablet Take 1 tablet by mouth daily.  . [DISCONTINUED] amoxicillin (AMOXIL) 500 MG capsule Take 1 capsule (500 mg total) by mouth 2 (two) times daily.  . [DISCONTINUED] benzonatate (TESSALON) 200 MG capsule Take 1 capsule (200 mg total) by mouth 3 (three) times daily as needed.    Review of Systems  Constitutional: Positive for chills.  HENT: Negative.   Eyes: Negative.   Respiratory: Negative.   Cardiovascular: Negative.   Gastrointestinal: Positive for abdominal pain (lower).  Endocrine: Negative.   Genitourinary: Positive for dysuria and frequency.  Musculoskeletal: Positive for myalgias.  Skin: Negative.   Allergic/Immunologic: Negative.   Neurological: Negative.   Hematological: Negative.   Psychiatric/Behavioral: Negative.        Objective:   Physical Exam  Constitutional: She is oriented to person, place, and time. She appears well-developed and well-nourished. No distress.  HENT:  Head: Normocephalic and atraumatic.  Right Ear: External ear normal.  Left Ear: External ear normal.  Nose: Nose normal.  Mouth/Throat:  Oropharynx is clear and moist.  Eyes: Conjunctivae and EOM are normal. Pupils are equal, round, and reactive to light. Right eye exhibits no discharge. Left eye exhibits no discharge. No scleral icterus.  Neck: Normal range of motion. Neck supple. No thyromegaly present.  Cardiovascular: Normal rate, regular rhythm and normal heart sounds.   No murmur heard. Pulmonary/Chest: Effort normal and breath sounds normal. No respiratory distress. She has no wheezes. She has no rales.  Abdominal: Soft. Bowel sounds are normal. She exhibits no mass. There is tenderness. There is no rebound and no guarding.  There is suprapubic tenderness. There is also some tenderness in the bilateral flank area and lower back area  Musculoskeletal: Normal range of motion.  Lymphadenopathy:    She has no cervical adenopathy.  Neurological: She is alert and oriented to person, place, and time.  Skin: Skin is warm and dry. No rash noted.  Psychiatric: She has a normal mood and affect. Her behavior is normal. Judgment and thought content normal.  Nursing note and vitals reviewed.  BP 127/78 mmHg  Pulse 96  Temp(Src) 98.2 F (36.8 C) (Oral)  Ht 5\' 10"  (1.778 m)  Wt 305 lb (138.347 kg)  BMI 43.76 kg/m2  Results for orders placed or performed in visit on 03/30/14  POCT UA - Microscopic Only  Result Value Ref Range   WBC, Ur, HPF, POC 15-20    RBC, urine, microscopic 10-15    Bacteria, U Microscopic neg    Mucus, UA neg    Epithelial cells, urine per micros occ    Crystals, Ur, HPF, POC neg  Casts, Ur, LPF, POC neg    Yeast, UA neg   POCT urinalysis dipstick  Result Value Ref Range   Color, UA yellow    Clarity, UA cloudy    Glucose, UA neg    Bilirubin, UA neg    Ketones, UA neg    Spec Grav, UA 1.015    Blood, UA moderate    pH, UA 6.5    Protein, UA neg    Urobilinogen, UA negative    Nitrite, UA neg    Leukocytes, UA moderate (2+)    Rapid strep test is pending     Assessment & Plan:  1.  Dysuria - POCT UA - Microscopic Only - POCT urinalysis dipstick - Urine culture - ciprofloxacin (CIPRO) 500 MG tablet; Take 1 tablet (500 mg total) by mouth 2 (two) times daily.  Dispense: 14 tablet; Refill: 0 - phenazopyridine (PYRIDIUM) 200 MG tablet; Take 1 tablet (200 mg total) by mouth 3 (three) times daily as needed for pain.  Dispense: 15 tablet; Refill: 0  2. UTI (lower urinary tract infection)  3. Strep throat exposure - POCT rapid strep A - Strep A culture, throat  Patient Instructions  Drink plenty of fluids Take medication for bladder as directed Take Tylenol as needed for aches pains and fever   Nyra Capeson W. Moore MD

## 2014-04-01 LAB — URINE CULTURE

## 2014-04-02 LAB — STREP A CULTURE, THROAT

## 2014-05-01 ENCOUNTER — Other Ambulatory Visit: Payer: Self-pay | Admitting: Obstetrics and Gynecology

## 2014-05-02 LAB — CYTOLOGY - PAP

## 2014-05-03 ENCOUNTER — Telehealth: Payer: Self-pay | Admitting: Family Medicine

## 2014-05-03 NOTE — Telephone Encounter (Signed)
Patient states that she is having symptoms of a UTI again and was seen by her gynecologist on Monday. They advised that she had blood in her urine and they would treat her for a UTI.

## 2014-07-27 ENCOUNTER — Encounter: Payer: Self-pay | Admitting: Family Medicine

## 2014-07-27 ENCOUNTER — Ambulatory Visit (INDEPENDENT_AMBULATORY_CARE_PROVIDER_SITE_OTHER): Payer: BLUE CROSS/BLUE SHIELD | Admitting: Family Medicine

## 2014-07-27 VITALS — BP 120/79 | HR 80 | Temp 97.5°F | Ht 70.0 in | Wt 304.0 lb

## 2014-07-27 DIAGNOSIS — J301 Allergic rhinitis due to pollen: Secondary | ICD-10-CM | POA: Diagnosis not present

## 2014-07-27 DIAGNOSIS — R059 Cough, unspecified: Secondary | ICD-10-CM

## 2014-07-27 DIAGNOSIS — R05 Cough: Secondary | ICD-10-CM

## 2014-07-27 MED ORDER — METHYLPREDNISOLONE ACETATE 80 MG/ML IJ SUSP
60.0000 mg | Freq: Once | INTRAMUSCULAR | Status: AC
  Administered 2014-07-27: 60 mg via INTRAMUSCULAR

## 2014-07-27 MED ORDER — FLUTICASONE PROPIONATE 50 MCG/ACT NA SUSP: 2.0000 | Freq: Every day | NASAL | Status: AC

## 2014-07-27 MED ORDER — PREDNISONE 10 MG PO TABS: ORAL_TABLET | ORAL | Status: DC

## 2014-07-27 NOTE — Patient Instructions (Signed)
Drink plenty of fluids Use nasal saline frequently during the day Use Flonase 1-2 sprays each nostril at bedtime Continue Zyrtec Use Mucinex, maximum strength, blue and white in color, 1 twice daily with a large glass of water for cough and congestion

## 2014-07-27 NOTE — Progress Notes (Signed)
Subjective:    Patient ID: Angela Johnson, female    DOB: 1985/08/03, 29 y.o.   MRN: [id number]  HPI Patient here today for cough, nasal drainage and chest soreness that started yesterday. The patient has had no fever and no yellow drainage or congestion.       There are no active problems to display for this patient.  Outpatient Encounter Prescriptions as of 07/27/2014  Medication Sig  . cetirizine (ZYRTEC) 10 MG tablet Take 10 mg by mouth daily as needed. allergies  . levothyroxine (SYNTHROID, LEVOTHROID) 50 MCG tablet Take 50 mcg by mouth daily.  . norgestrel-ethinyl estradiol (LO/OVRAL,CRYSELLE) 0.3-30 MG-MCG tablet Take 1 tablet by mouth daily.  . [DISCONTINUED] levothyroxine (SYNTHROID, LEVOTHROID) 25 MCG tablet TAKE 1 TABLET (25 MCG TOTAL) BY MOUTH DAILY. (Patient taking differently: TAKE 1 TABLET (50 MCG TOTAL) BY MOUTH DAILY.)  . [DISCONTINUED] ciprofloxacin (CIPRO) 500 MG tablet Take 1 tablet (500 mg total) by mouth 2 (two) times daily.  . [DISCONTINUED] phenazopyridine (PYRIDIUM) 200 MG tablet Take 1 tablet (200 mg total) by mouth 3 (three) times daily as needed for pain.     Review of Systems  Constitutional: Negative.   HENT: Positive for congestion.   Eyes: Negative.   Respiratory: Positive for cough and chest tightness.   Cardiovascular: Negative.   Gastrointestinal: Negative.   Endocrine: Negative.   Genitourinary: Negative.   Musculoskeletal: Negative.   Skin: Negative.   Allergic/Immunologic: Negative.   Neurological: Negative.   Hematological: Negative.   Psychiatric/Behavioral: Negative.        Objective:   Physical Exam  Constitutional: She is oriented to person, place, and time. She appears well-developed and well-nourished. No distress.  HENT:  Head: Normocephalic and atraumatic.  Right Ear: External ear normal.  Left Ear: External ear normal.  Mouth/Throat: Oropharynx is clear and moist. No oropharyngeal exudate.  Nasal congestion and  turbinate swelling bilaterally left greater than right  Eyes: Conjunctivae and EOM are normal. Pupils are equal, round, and reactive to light. Right eye exhibits no discharge. Left eye exhibits no discharge. No scleral icterus.  Neck: Normal range of motion. Neck supple. No thyromegaly present.  Cardiovascular: Normal rate, regular rhythm and normal heart sounds.   No murmur heard. Pulmonary/Chest: Effort normal and breath sounds normal. No respiratory distress. She has no wheezes. She has no rales. She exhibits no tenderness.  Minimal upper chest congestion with coughing which cleared after coughing.  Abdominal: She exhibits no mass.  Musculoskeletal: Normal range of motion. She exhibits no edema.  Lymphadenopathy:    She has no cervical adenopathy.  Neurological: She is alert and oriented to person, place, and time.  Skin: Skin is warm and dry. No rash noted.  Psychiatric: She has a normal mood and affect. Her behavior is normal. Judgment and thought content normal.  Nursing note and vitals reviewed.  BP 120/79 mmHg  Pulse 80  Temp(Src) 97.5 F (36.4 C) (Oral)  Ht  (1.778 m)  Wt 304 lb (137.893 kg)  BMI 43.62 kg/m2  LMP 06/29/2014        Assessment & Plan:  1. Allergic rhinitis due to pollen - fluticasone (FLONASE) 50 MCG/ACT nasal spray; Place 2 sprays into both nostrils daily.  Dispense: 16 g; Refill: 6 - predniSONE (DELTASONE) 10 MG tablet; 1 tablet 4 times a day for 2 days,  1 tablet 3 times a day for 2 days,  1 tablet 2 times a day for 2 days, 1 tablet daily for  2 days  Dispense: 20 tablet; Refill: 0 - methylPREDNISolone acetate (DEPO-MEDROL) injection 60 mg; Inject 0.75 mLs (60 mg total) into the muscle once.  2. Cough - predniSONE (DELTASONE) 10 MG tablet; 1 tablet 4 times a day for 2 days,  1 tablet 3 times a day for 2 days,  1 tablet 2 times a day for 2 days, 1 tablet daily for 2 days  Dispense: 20 tablet; Refill: 0 - methylPREDNISolone acetate  (DEPO-MEDROL) injection 60 mg; Inject 0.75 mLs (60 mg total) into the muscle once.  Patient Instructions  Drink plenty of fluids Use nasal saline frequently during the day Use Flonase 1-2 sprays each nostril at bedtime Continue Zyrtec Use Mucinex, maximum strength, blue and white in color, 1 twice daily with a large glass of water for cough and congestion   Nyra Capeson W. Moore MD

## 2014-12-08 ENCOUNTER — Other Ambulatory Visit (INDEPENDENT_AMBULATORY_CARE_PROVIDER_SITE_OTHER): Payer: BLUE CROSS/BLUE SHIELD

## 2014-12-08 ENCOUNTER — Other Ambulatory Visit: Payer: Self-pay | Admitting: *Deleted

## 2014-12-08 DIAGNOSIS — R5382 Chronic fatigue, unspecified: Secondary | ICD-10-CM

## 2014-12-08 DIAGNOSIS — R5383 Other fatigue: Secondary | ICD-10-CM | POA: Diagnosis not present

## 2014-12-08 NOTE — Progress Notes (Signed)
Lab only 

## 2014-12-09 LAB — CMP14+EGFR
A/G RATIO: 1.4 (ref 1.1–2.5)
ALT: 14 IU/L (ref 0–32)
AST: 15 IU/L (ref 0–40)
Albumin: 4.1 g/dL (ref 3.5–5.5)
Alkaline Phosphatase: 64 IU/L (ref 39–117)
BUN/Creatinine Ratio: 13 (ref 8–20)
BUN: 10 mg/dL (ref 6–20)
Bilirubin Total: 0.3 mg/dL (ref 0.0–1.2)
CALCIUM: 9.2 mg/dL (ref 8.7–10.2)
CO2: 19 mmol/L (ref 18–29)
CREATININE: 0.8 mg/dL (ref 0.57–1.00)
Chloride: 100 mmol/L (ref 97–108)
GFR, EST AFRICAN AMERICAN: 115 mL/min/{1.73_m2} (ref >59)
GFR, EST NON AFRICAN AMERICAN: 100 mL/min/{1.73_m2} (ref >59)
GLOBULIN, TOTAL: 2.9 g/dL (ref 1.5–4.5)
Glucose: 123 mg/dL — ABNORMAL HIGH (ref 65–99)
POTASSIUM: 4.2 mmol/L (ref 3.5–5.2)
Sodium: 139 mmol/L (ref 134–144)
TOTAL PROTEIN: 7 g/dL (ref 6.0–8.5)

## 2014-12-09 LAB — LIPID PANEL
CHOL/HDL RATIO: 6.3 ratio — AB (ref 0.0–4.4)
CHOLESTEROL TOTAL: 213 mg/dL — AB (ref 100–199)
HDL: 34 mg/dL — AB (ref >39)
LDL Calculated: 123 mg/dL — ABNORMAL HIGH (ref 0–99)
TRIGLYCERIDES: 281 mg/dL — AB (ref 0–149)
VLDL Cholesterol Cal: 56 mg/dL — ABNORMAL HIGH (ref 5–40)

## 2014-12-09 LAB — THYROID PANEL WITH TSH
Free Thyroxine Index: 1.7 (ref 1.2–4.9)
T3 UPTAKE RATIO: 21 % — AB (ref 24–39)
T4, Total: 8.1 ug/dL (ref 4.5–12.0)
TSH: 3.32 u[IU]/mL (ref 0.450–4.500)

## 2014-12-12 ENCOUNTER — Telehealth: Payer: Self-pay | Admitting: Family

## 2014-12-12 NOTE — Telephone Encounter (Signed)
Pt has been on 25 mcg thyroid before GYN did labwork in May and they increased her dose to 50 mcg. She was only given #30 and was not able to get back to our office till now. She is having the same fatigue and all that she was having before she was put on thyroid medication. If she needs to wait the 3 mos she understands, but she is not feeling well.

## 2014-12-13 ENCOUNTER — Other Ambulatory Visit: Payer: Self-pay | Admitting: Nurse Practitioner

## 2014-12-13 MED ORDER — LEVOTHYROXINE SODIUM 25 MCG PO CAPS: 1.0000 | ORAL_CAPSULE | Freq: Every day | ORAL | Status: DC

## 2014-12-13 NOTE — Telephone Encounter (Signed)
Pt aware thyroid medication sent to pharmacy.

## 2014-12-13 NOTE — Progress Notes (Signed)
Pt aware thyroid medication sent to pharmacy in another encounter.

## 2014-12-13 NOTE — Telephone Encounter (Signed)
Ok to start back on - will send rx to pharmacy

## 2015-03-30 ENCOUNTER — Ambulatory Visit (INDEPENDENT_AMBULATORY_CARE_PROVIDER_SITE_OTHER): Payer: BLUE CROSS/BLUE SHIELD | Admitting: Nurse Practitioner

## 2015-03-30 ENCOUNTER — Encounter: Payer: Self-pay | Admitting: Nurse Practitioner

## 2015-03-30 VITALS — BP 151/94 | HR 96 | Temp 97.2°F | Ht 70.0 in | Wt 316.4 lb

## 2015-03-30 DIAGNOSIS — J0100 Acute maxillary sinusitis, unspecified: Secondary | ICD-10-CM | POA: Diagnosis not present

## 2015-03-30 MED ORDER — AZITHROMYCIN 250 MG PO TABS: ORAL_TABLET | ORAL | Status: DC

## 2015-03-30 NOTE — Patient Instructions (Signed)

## 2015-03-30 NOTE — Progress Notes (Signed)
  Subjective:     Angela Johnson is a 29 y.o. female who presents for evaluation of sinus pain. Symptoms include: congestion, cough, sinus pressure and sore throat. Onset of symptoms was 2 week ago. Fever satarted 2 days ago.  Symptoms have been gradually worsening since that time. Past history is significant for no history of pneumonia or bronchitis. Patient is a non-smoker.  The following portions of the patient's history were reviewed and updated as appropriate: allergies, current medications, past family history, past medical history, past social history, past surgical history and problem list.  Review of Systems Pertinent items are noted in HPI.   Objective:    BP 151/94 mmHg  Pulse 96  Temp(Src) 97.2 F (36.2 C) (Oral)  Ht 5\' 10"  (1.778 m)  Wt 316 lb 6.4 oz (143.518 kg)  BMI 45.40 kg/m2  LMP 03/23/2015 Eyes: conjunctivae/corneas clear. PERRL, EOM's intact. Fundi benign. Ears: normal TM's and external ear canals both ears Nose: blood tinged discharge, moderate congestion, sinus tenderness bilateral Throat: lips, mucosa, and tongue normal; teeth and gums normal Neck: no adenopathy, no carotid bruit, no JVD, supple, symmetrical, trachea midline and thyroid not enlarged, symmetric, no tenderness/mass/nodules Lungs: clear to auscultation bilaterally and dry hacky cough Heart: regular rate and rhythm, S1, S2 normal, no murmur, click, rub or gallop    Assessment:    Acute bacterial sinusitis.    Plan:   1. Take meds as prescribed 2. Use a cool mist humidifier especially during the winter months and when heat has been humid. 3. Use saline nose sprays frequently 4. Saline irrigations of the nose can be very helpful if done frequently.  * 4X daily for 1 week*  * Use of a nettie pot can be helpful with this. Follow directions with this* 5. Drink plenty of fluids 6. Keep thermostat turn down low 7.For any cough or congestion  Use plain Mucinex- regular strength or max strength  is fine   * Children- consult with Pharmacist for dosing 8. For fever or aces or pains- take tylenol or ibuprofen appropriate for age and weight.  * for fevers greater than 101 orally you may alternate ibuprofen and tylenol every  3 hours.    Meds ordered this encounter  Medications  . azithromycin (ZITHROMAX) 250 MG tablet    Sig: Two tablets day one, then one tablet daily next 4 days.    Dispense:  6 tablet    Refill:  0    Order Specific Question:  Supervising Provider    Answer:  Ernestina PennaMOORE, DONALD W [[id number]   Mary-Margaret Daphine DeutscherMartin, FNP

## 2015-05-15 ENCOUNTER — Ambulatory Visit (INDEPENDENT_AMBULATORY_CARE_PROVIDER_SITE_OTHER): Payer: BLUE CROSS/BLUE SHIELD | Admitting: Pediatrics

## 2015-05-15 ENCOUNTER — Encounter: Payer: Self-pay | Admitting: Pediatrics

## 2015-05-15 VITALS — BP 125/89 | HR 83 | Temp 97.4°F | Ht 70.0 in | Wt 312.6 lb

## 2015-05-15 DIAGNOSIS — R109 Unspecified abdominal pain: Secondary | ICD-10-CM | POA: Diagnosis not present

## 2015-05-15 DIAGNOSIS — N390 Urinary tract infection, site not specified: Secondary | ICD-10-CM

## 2015-05-15 DIAGNOSIS — R8281 Pyuria: Secondary | ICD-10-CM

## 2015-05-15 DIAGNOSIS — R399 Unspecified symptoms and signs involving the genitourinary system: Secondary | ICD-10-CM | POA: Diagnosis not present

## 2015-05-15 LAB — POCT UA - MICROSCOPIC ONLY
CASTS, UR, LPF, POC: NEGATIVE
Crystals, Ur, HPF, POC: NEGATIVE
Mucus, UA: NEGATIVE
YEAST UA: NEGATIVE

## 2015-05-15 LAB — POCT URINALYSIS DIPSTICK
BILIRUBIN UA: NEGATIVE
GLUCOSE UA: NEGATIVE
Ketones, UA: NEGATIVE
NITRITE UA: NEGATIVE
Protein, UA: NEGATIVE
SPEC GRAV UA: 1.025
Urobilinogen, UA: NEGATIVE
pH, UA: 5

## 2015-05-15 MED ORDER — NITROFURANTOIN MONOHYD MACRO 100 MG PO CAPS: 100.0000 mg | ORAL_CAPSULE | Freq: Two times a day (BID) | ORAL | Status: DC

## 2015-05-15 NOTE — Progress Notes (Signed)
    Subjective:    Patient ID: Angela Johnson, female    DOB: 1986-01-05, 30 y.o.   MRN: [id number]  CC: Uti Symptoms and Back Pain   HPI: Angela Johnson is a 30 y.o. female presenting for Uti Symptoms and Back Pain  Having severe back pain for past 3-4 days No burning with urination Having some spotting Strong smelling urine  2nd lifetime possible UTI.  Has a herniated disc, not having as much back pain since birth of child Has back pain with each menstrual period Back pain mostly on L side Pain there constantly Pain is colicky, dull pain all the time, sharp pains run down leg No fevers Appetite normal  Mom with kidney stones No personal fam hx of stones.   Depression screen PHQ 2/9 07/27/2014  Decreased Interest 0  Down, Depressed, Hopeless 0  PHQ - 2 Score 0     Relevant past medical, surgical, family and social history reviewed and updated as indicated. Interim medical history since our last visit reviewed. Allergies and medications reviewed and updated.    ROS: Per HPI unless specifically indicated above  History  Smoking status  . Former Smoker  . Quit date: 03/31/2005  Smokeless tobacco  . Never Used    Past Medical History There are no active problems to display for this patient.       Objective:    BP 125/89 mmHg  Pulse 83  Temp(Src) 97.4 F (36.3 C) (Oral)  Ht  (1.778 m)  Wt 312 lb 9.6 oz (141.794 kg)  BMI 44.85 kg/m2  Wt Readings from Last 3 Encounters:  05/15/15 312 lb 9.6 oz (141.794 kg)  03/30/15 316 lb 6.4 oz (143.518 kg)  07/27/14 304 lb (137.893 kg)     Gen: NAD, alert, cooperative with exam, NCAT EYES: EOMI, no scleral injection or icterus ENT:  OP without erythema LYMPH: no cervical LAD CV: NRRR, normal S1/S2, no murmur, distal pulses 2+ b/l Resp: CTABL, no wheezes, normal WOB Abd: +BS, soft, mildly tender LLQ with palpation, ND. no guarding or organomegaly Ext: No edema, warm Neuro: Alert and oriented, strength  equal b/l UE and LE, coordination grossly normal MSK: tender w/ palpation over lower back midline, L side of lower back as well with palpation. Pain with straight leg lift b/l.      Assessment & Plan:    Gavyn was seen today for uti symptoms and back pain, large amount of blood, small amount LE in urine.  Diagnoses and all orders for this visit:  UTI symptoms -     POCT urinalysis dipstick -     POCT UA - Microscopic Only  Pyuria -     nitrofurantoin, macrocrystal-monohydrate, (MACROBID) 100 MG capsule; Take 1 capsule (100 mg total) by mouth 2 (two) times daily. 1 po BId  Left flank pain -     CT RENAL STONE STUDY; Future   Follow up plan: As needed  Rex Kras, MD [facility] 05/15/2015, 8:54 AM

## 2015-05-16 ENCOUNTER — Other Ambulatory Visit (HOSPITAL_COMMUNITY): Payer: Self-pay | Admitting: Pulmonary Disease

## 2015-05-17 LAB — URINE CULTURE

## 2015-05-23 ENCOUNTER — Ambulatory Visit (HOSPITAL_COMMUNITY): Payer: Medicaid Other

## 2015-05-26 ENCOUNTER — Encounter: Payer: Self-pay | Admitting: Nurse Practitioner

## 2015-05-26 ENCOUNTER — Ambulatory Visit (INDEPENDENT_AMBULATORY_CARE_PROVIDER_SITE_OTHER): Payer: BLUE CROSS/BLUE SHIELD | Admitting: Nurse Practitioner

## 2015-05-26 VITALS — BP 132/87 | HR 81 | Temp 97.5°F | Ht 70.0 in | Wt 311.0 lb

## 2015-05-26 DIAGNOSIS — J029 Acute pharyngitis, unspecified: Secondary | ICD-10-CM | POA: Diagnosis not present

## 2015-05-26 LAB — POCT RAPID STREP A (OFFICE): Rapid Strep A Screen: NEGATIVE

## 2015-05-26 LAB — POCT INFLUENZA A/B
Influenza A, POC: NEGATIVE
Influenza B, POC: NEGATIVE

## 2015-05-26 NOTE — Progress Notes (Signed)
   Subjective:    Patient ID: Angela Johnson, female    DOB: 1985-11-07, 30 y.o.   MRN: [id number]   Patient in c/o:  Sore Throat  This is a new problem. The current episode started in the past 7 days. The problem has been gradually worsening. Neither side of throat is experiencing more pain than the other. There has been no fever. The pain is at a severity of 6/10. The pain is moderate. Associated symptoms include trouble swallowing and vomiting. Pertinent negatives include no congestion, coughing, headaches or hoarse voice. Treatments tried: nyquil. The treatment provided mild relief.      Review of Systems  Constitutional: Negative for fever and chills.  HENT: Positive for sore throat and trouble swallowing. Negative for congestion, hoarse voice and sinus pressure.   Respiratory: Negative for cough.   Gastrointestinal: Positive for vomiting.  Genitourinary: Negative.   Neurological: Negative.  Negative for headaches.  Psychiatric/Behavioral: Negative.   All other systems reviewed and are negative.      Objective:   Physical Exam  Constitutional: She is oriented to person, place, and time. She appears well-developed and well-nourished. No distress.  HENT:  Right Ear: Hearing, tympanic membrane, external ear and ear canal normal.  Left Ear: Hearing, tympanic membrane, external ear and ear canal normal.  Nose: Mucosal edema and rhinorrhea present. Right sinus exhibits no maxillary sinus tenderness and no frontal sinus tenderness. Left sinus exhibits no maxillary sinus tenderness and no frontal sinus tenderness.  Mouth/Throat: Uvula is midline. Posterior oropharyngeal edema (mild) present.  Eyes: Pupils are equal, round, and reactive to light.  Neck: Normal range of motion.  Cardiovascular: Normal rate, regular rhythm and normal heart sounds.   Pulmonary/Chest: Effort normal and breath sounds normal.  Neurological: She is alert and oriented to person, place, and time.  Skin: Skin  is warm.  Psychiatric: She has a normal mood and affect. Her behavior is normal. Judgment and thought content normal.    BP 132/87 mmHg  Pulse 81  Temp(Src) 97.5 F (36.4 C) (Oral)  Ht  (1.778 m)  Wt 311 lb (141.069 kg)  BMI 44.62 kg/m2  Results for orders placed or performed in visit on 05/26/15  POCT Influenza A/B  Result Value Ref Range   Influenza A, POC Negative Negative   Influenza B, POC Negative Negative  POCT rapid strep A  Result Value Ref Range   Rapid Strep A Screen Negative Negative        Assessment & Plan:   1. Sore throat    Force fluids Motrin or tylenol OTC OTC decongestant Throat lozenges if help New toothbrush in 3 days  RTO prn  [name], FNP

## 2015-05-26 NOTE — Patient Instructions (Signed)
Force fluids °Motrin or tylenol OTC °OTC decongestant °Throat lozenges if help °New toothbrush in 3 days ° °

## 2015-06-09 ENCOUNTER — Telehealth: Payer: Self-pay | Admitting: Family Medicine

## 2015-06-09 MED ORDER — AZITHROMYCIN 250 MG PO TABS: ORAL_TABLET | ORAL | Status: DC

## 2015-06-09 NOTE — Telephone Encounter (Signed)
Persistent cough and congestion, sent azithromycin

## 2015-08-25 ENCOUNTER — Ambulatory Visit (INDEPENDENT_AMBULATORY_CARE_PROVIDER_SITE_OTHER): Payer: BLUE CROSS/BLUE SHIELD | Admitting: Family Medicine

## 2015-08-25 VITALS — BP 153/103 | HR 80 | Temp 97.2°F | Ht 70.0 in | Wt 316.0 lb

## 2015-08-25 DIAGNOSIS — R300 Dysuria: Secondary | ICD-10-CM

## 2015-08-25 MED ORDER — SULFAMETHOXAZOLE-TRIMETHOPRIM 800-160 MG PO TABS: 1.0000 | ORAL_TABLET | Freq: Two times a day (BID) | ORAL | Status: DC

## 2015-08-25 MED ORDER — PHENAZOPYRIDINE HCL 200 MG PO TABS: 200.0000 mg | ORAL_TABLET | Freq: Four times a day (QID) | ORAL | Status: DC | PRN

## 2015-08-25 NOTE — Progress Notes (Signed)
Subjective:  Patient ID: Angela Johnson, female    DOB: 12/15/1985  Age: 30 y.o. MRN: [id number]  CC: Urinary pressure   HPI Desia L Shepherd presents for burning with urination and frequency for several days. Denies fever . No flank pain. No nausea, vomiting.    History Dorathy DaftKayla has a past medical history of Hypothyroidism; Irritable bowel syndrome (IBS); Herniated disc; Arthritis; and Gonorrhea (2007).   She has past surgical history that includes oral.   Her family history includes Hyperlipidemia in her father and mother; Hypertension in her mother; Thyroid disease in her father.She reports that she quit smoking about 10 years ago. She has never used smokeless tobacco. She reports that she does not drink alcohol or use illicit drugs.    ROS Review of Systems  Constitutional: Negative for fever, chills and diaphoresis.  HENT: Negative for congestion.   Eyes: Negative for visual disturbance.  Respiratory: Negative for cough and shortness of breath.   Cardiovascular: Negative for chest pain and palpitations.  Gastrointestinal: Negative for nausea, diarrhea and constipation.  Genitourinary: Positive for dysuria, urgency and frequency. Negative for hematuria, flank pain, decreased urine volume, menstrual problem and pelvic pain.  Musculoskeletal: Negative for joint swelling and arthralgias.  Skin: Negative for rash.  Neurological: Negative for dizziness and numbness.    Objective:  BP 153/103 mmHg  Pulse 80  Temp(Src) 97.2 F (36.2 C) (Oral)  Ht 5\' 10"  (1.778 m)  Wt 316 lb (143.337 kg)  BMI 45.34 kg/m2  BP Readings from Last 3 Encounters:  08/25/15 153/103  05/26/15 132/87  05/15/15 125/89    Wt Readings from Last 3 Encounters:  08/25/15 316 lb (143.337 kg)  05/26/15 311 lb (141.069 kg)  05/15/15 312 lb 9.6 oz (141.794 kg)     Physical Exam  Constitutional: She is oriented to person, place, and time. She appears well-developed and well-nourished.  HENT:  Head:  Normocephalic and atraumatic.  Cardiovascular: Normal rate and regular rhythm.   No murmur heard. Pulmonary/Chest: Effort normal and breath sounds normal.  Abdominal: Soft. Bowel sounds are normal. She exhibits no mass. There is no tenderness. There is no rebound and no guarding.  Musculoskeletal: She exhibits no tenderness.  Neurological: She is alert and oriented to person, place, and time.  Skin: Skin is warm and dry.  Psychiatric: She has a normal mood and affect. Her behavior is normal.     Lab Results  Component Value Date   WBC 11.1* 08/28/2011   HGB 9.6* 08/28/2011   HCT 29.2* 08/28/2011   PLT 158 08/28/2011   GLUCOSE 123* 12/08/2014   CHOL 213* 12/08/2014   TRIG 281* 12/08/2014   HDL 34* 12/08/2014   LDLCALC 123* 12/08/2014   ALT 14 12/08/2014   AST 15 12/08/2014   NA 139 12/08/2014   K 4.2 12/08/2014   CL 100 12/08/2014   CREATININE 0.80 12/08/2014   BUN 10 12/08/2014   CO2 19 12/08/2014   TSH 3.320 12/08/2014    No results found.  Assessment & Plan:   Dorathy DaftKayla was seen today for urinary pressure.  Diagnoses and all orders for this visit:  Dysuria -     Urinalysis  Other orders -     sulfamethoxazole-trimethoprim (BACTRIM DS,SEPTRA DS) 800-160 MG tablet; Take 1 tablet by mouth 2 (two) times daily. -     phenazopyridine (PYRIDIUM) 200 MG tablet; Take 1 tablet (200 mg total) by mouth 4 (four) times daily as needed for pain (urinary frequency &/or pain).  Medication List       This list is accurate as of: 08/25/15  9:06 AM.  Always use your most recent med list.               cetirizine 10 MG tablet  Commonly known as:  ZYRTEC  Take 10 mg by mouth daily as needed. allergies     fluticasone 50 MCG/ACT nasal spray  Commonly known as:  FLONASE  Place 2 sprays into both nostrils daily.     Levothyroxine Sodium 25 MCG Caps  Take 1 capsule (25 mcg total) by mouth daily before breakfast.     norgestrel-ethinyl estradiol 0.3-30 MG-MCG tablet    Commonly known as:  LO/OVRAL,CRYSELLE  Take 1 tablet by mouth daily.     phenazopyridine 200 MG tablet  Commonly known as:  PYRIDIUM  Take 1 tablet (200 mg total) by mouth 4 (four) times daily as needed for pain (urinary frequency &/or pain).     sulfamethoxazole-trimethoprim 800-160 MG tablet  Commonly known as:  BACTRIM DS,SEPTRA DS  Take 1 tablet by mouth 2 (two) times daily.              Follow-up: Return if symptoms worsen or fail to improve.  Mechele Claude, M.D.

## 2015-08-28 LAB — URINALYSIS
Bilirubin, UA: NEGATIVE
Glucose, UA: NEGATIVE
KETONES UA: NEGATIVE
NITRITE UA: NEGATIVE
PH UA: 6 (ref 5.0–7.5)
SPEC GRAV UA: 1.025 (ref 1.005–1.030)
UUROB: 0.2 mg/dL (ref 0.2–1.0)

## 2015-09-05 DIAGNOSIS — Z6841 Body Mass Index (BMI) 40.0 and over, adult: Secondary | ICD-10-CM | POA: Diagnosis not present

## 2015-09-05 DIAGNOSIS — Z124 Encounter for screening for malignant neoplasm of cervix: Secondary | ICD-10-CM | POA: Diagnosis not present

## 2015-09-05 DIAGNOSIS — Z01419 Encounter for gynecological examination (general) (routine) without abnormal findings: Secondary | ICD-10-CM | POA: Diagnosis not present

## 2015-10-08 ENCOUNTER — Encounter: Payer: Self-pay | Admitting: Pediatrics

## 2015-10-08 ENCOUNTER — Ambulatory Visit (INDEPENDENT_AMBULATORY_CARE_PROVIDER_SITE_OTHER): Payer: BLUE CROSS/BLUE SHIELD | Admitting: Pediatrics

## 2015-10-08 VITALS — BP 141/91 | HR 83 | Temp 97.4°F | Ht 70.0 in | Wt 318.0 lb

## 2015-10-08 DIAGNOSIS — R232 Flushing: Secondary | ICD-10-CM | POA: Diagnosis not present

## 2015-10-08 DIAGNOSIS — F411 Generalized anxiety disorder: Secondary | ICD-10-CM | POA: Diagnosis not present

## 2015-10-08 DIAGNOSIS — Z6841 Body Mass Index (BMI) 40.0 and over, adult: Secondary | ICD-10-CM | POA: Diagnosis not present

## 2015-10-08 DIAGNOSIS — IMO0001 Reserved for inherently not codable concepts without codable children: Secondary | ICD-10-CM

## 2015-10-08 DIAGNOSIS — R030 Elevated blood-pressure reading, without diagnosis of hypertension: Secondary | ICD-10-CM | POA: Diagnosis not present

## 2015-10-08 DIAGNOSIS — R399 Unspecified symptoms and signs involving the genitourinary system: Secondary | ICD-10-CM | POA: Diagnosis not present

## 2015-10-08 LAB — URINALYSIS, COMPLETE
Bilirubin, UA: NEGATIVE
GLUCOSE, UA: NEGATIVE
Ketones, UA: NEGATIVE
LEUKOCYTES UA: NEGATIVE
NITRITE UA: NEGATIVE
Protein, UA: NEGATIVE
Specific Gravity, UA: >1.03 — ABNORMAL HIGH (ref 1.005–1.030)
Urobilinogen, Ur: 0.2 mg/dL (ref 0.2–1.0)
pH, UA: 6 (ref 5.0–7.5)

## 2015-10-08 LAB — MICROSCOPIC EXAMINATION

## 2015-10-08 MED ORDER — VENLAFAXINE HCL 37.5 MG PO TABS: 37.5000 mg | ORAL_TABLET | Freq: Two times a day (BID) | ORAL | Status: DC

## 2015-10-08 NOTE — Progress Notes (Signed)
Subjective:    Patient ID: Angela Johnson, female    DOB: [birth year], 30 y.o.   MRN: [id number]  CC: Dizziness; Headaches; and Shortness of Breath   HPI: Angela Johnson is a 30 y.o. 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 because feeling so bad, BP 120/84, BGL 85 Cold, clammy then and now  Says mood has been fine She does think anxiety has been worse than usual  Feeling nervous, anxious or on edge 1 Not being able to stop or control worrying 2 Worrying too much about different things 2 Trouble relaxing 3 Being so restless that it is hard to sit still 2 Becoming easily annoyed or irritable 3 Feeling afraid as if something awful might happen 1        Total Score 14    Depression screen [facility] 2/9 10/08/2015 07/27/2014  Decreased Interest 0 0  Down, Depressed, Hopeless 0 0  PHQ - 2 Score 0 0     Relevant past medical, surgical, family and social history reviewed and updated as indicated.  Interim medical history since our last visit reviewed. Allergies and medications reviewed and updated.  ROS: Per HPI unless specifically indicated above  History  Smoking status  . Former Smoker  . Quit date: 03/31/2005  Smokeless tobacco  . Never Used       Objective:    BP 141/91 mmHg  Pulse 83  Temp(Src) 97.4 F (36.3 C) (Oral)  Ht _0  (1.778 m)  Wt 318 lb (144.244 kg)  BMI 45.63 kg/m2  Wt Readings from Last 3 Encounters:  10/08/15 318 lb (144.244 kg)  08/25/15 316 lb  (143.337 kg)  05/26/15 311 lb (141.069 kg)     Gen: NAD, alert, cooperative with exam, NCAT, skin clammy to touch EYES: EOMI, no scleral injection or icterus ENT:  TMs pearly gray b/l, OP without erythema LYMPH: no cervical LAD CV: NRRR, normal S1/S2, no murmur, distal pulses 2+ b/l Resp: CTABL, no wheezes, normal WOB Abd: +BS, soft, NTND. Ext: No edema, warm Neuro: Alert and oriented, strength equal b/l UE and LE, coordination grossly normal MSK: normal muscle bulk Psych: well groomed, normal affect     Assessment & Plan:    Angela Johnson was seen today for dizziness, headaches and shortness of breath of unclear etiology. Exam normal, BP elevated, has been on other recent office visit. Was normal this morning.  Diagnoses and all orders for this visit:  Flushing -     Metanephrines, plasma -     BMP8+EGFR -     CBC -     TSH  Elevated BP -     Metanephrines, plasma -     BMP8+EGFR -     CBC -     TSH  Anxiety state -     venlafaxine (EFFEXOR) 37.5 MG tablet; Take 1 tablet (37.5 mg total) by mouth 2 (two) times daily. -     TSH  UTI symptoms -     Urinalysis, Complete  BMI  45.0-49.9, adult ([place]) Wants to get more info re what to eat for weight loss Has been gaining recently  Other orders -     Microscopic Examination    Follow up plan: Return for tammy when available for nutrition, Dr. Evette Doffing in 3 weeks.  Assunta Found, MD [facility] 10/08/2015, 4:37 PM

## 2015-10-09 DIAGNOSIS — Z6841 Body Mass Index (BMI) 40.0 and over, adult: Secondary | ICD-10-CM | POA: Insufficient documentation

## 2015-10-09 DIAGNOSIS — R030 Elevated blood-pressure reading, without diagnosis of hypertension: Secondary | ICD-10-CM | POA: Insufficient documentation

## 2015-10-09 DIAGNOSIS — F411 Generalized anxiety disorder: Secondary | ICD-10-CM | POA: Insufficient documentation

## 2015-10-09 DIAGNOSIS — F410 Panic disorder [episodic paroxysmal anxiety] without agoraphobia: Secondary | ICD-10-CM | POA: Insufficient documentation

## 2015-10-16 LAB — BMP8+EGFR
BUN / CREAT RATIO: 17 (ref 9–23)
BUN: 14 mg/dL (ref 6–20)
CHLORIDE: 99 mmol/L (ref 96–106)
CO2: 23 mmol/L (ref 18–29)
Calcium: 9.6 mg/dL (ref 8.7–10.2)
Creatinine, Ser: 0.84 mg/dL (ref 0.57–1.00)
GFR calc non Af Amer: 94 mL/min/{1.73_m2} (ref >59)
GFR, EST AFRICAN AMERICAN: 108 mL/min/{1.73_m2} (ref >59)
Glucose: 92 mg/dL (ref 65–99)
Potassium: 4.4 mmol/L (ref 3.5–5.2)
SODIUM: 140 mmol/L (ref 134–144)

## 2015-10-16 LAB — CBC
HEMATOCRIT: 38.5 % (ref 34.0–46.6)
Hemoglobin: 12.7 g/dL (ref 11.1–15.9)
MCH: 29.3 pg (ref 26.6–33.0)
MCHC: 33 g/dL (ref 31.5–35.7)
MCV: 89 fL (ref 79–97)
PLATELETS: 228 10*3/uL (ref 150–379)
RBC: 4.33 x10E6/uL (ref 3.77–5.28)
RDW: 13.9 % (ref 12.3–15.4)
WBC: 6.7 10*3/uL (ref 3.4–10.8)

## 2015-10-16 LAB — METANEPHRINES, PLASMA: NORMETANEPHRINE FREE: 32 pg/mL (ref 0–145)

## 2015-10-16 LAB — TSH: TSH: 4.47 u[IU]/mL (ref 0.450–4.500)

## 2015-12-08 ENCOUNTER — Other Ambulatory Visit: Payer: Self-pay | Admitting: Pediatrics

## 2015-12-08 DIAGNOSIS — F411 Generalized anxiety disorder: Secondary | ICD-10-CM

## 2016-02-04 ENCOUNTER — Encounter: Payer: Self-pay | Admitting: Pediatrics

## 2016-02-04 ENCOUNTER — Ambulatory Visit (INDEPENDENT_AMBULATORY_CARE_PROVIDER_SITE_OTHER): Payer: BLUE CROSS/BLUE SHIELD | Admitting: Pediatrics

## 2016-02-04 VITALS — BP 139/83 | HR 91 | Temp 98.7°F | Ht 70.0 in | Wt 319.0 lb

## 2016-02-04 DIAGNOSIS — R05 Cough: Secondary | ICD-10-CM | POA: Diagnosis not present

## 2016-02-04 DIAGNOSIS — J069 Acute upper respiratory infection, unspecified: Secondary | ICD-10-CM

## 2016-02-04 DIAGNOSIS — R0981 Nasal congestion: Secondary | ICD-10-CM | POA: Diagnosis not present

## 2016-02-04 DIAGNOSIS — R059 Cough, unspecified: Secondary | ICD-10-CM

## 2016-02-04 DIAGNOSIS — R52 Pain, unspecified: Secondary | ICD-10-CM

## 2016-02-04 DIAGNOSIS — F411 Generalized anxiety disorder: Secondary | ICD-10-CM

## 2016-02-04 DIAGNOSIS — H6501 Acute serous otitis media, right ear: Secondary | ICD-10-CM | POA: Diagnosis not present

## 2016-02-04 DIAGNOSIS — R5383 Other fatigue: Secondary | ICD-10-CM | POA: Diagnosis not present

## 2016-02-04 LAB — VERITOR FLU A/B WAIVED
INFLUENZA B: NEGATIVE
Influenza A: NEGATIVE

## 2016-02-04 MED ORDER — CITALOPRAM HYDROBROMIDE 20 MG PO TABS: ORAL_TABLET | ORAL | 3 refills | Status: DC

## 2016-02-04 MED ORDER — AMOXICILLIN 500 MG PO CAPS: 500.0000 mg | ORAL_CAPSULE | Freq: Two times a day (BID) | ORAL | 0 refills | Status: DC

## 2016-02-04 NOTE — Progress Notes (Signed)
  Subjective:   Patient ID: Angela Johnson, female    DOB: 08/01/1985, 30 y.o.   MRN: [id number] CC: Cough (x 2 weeks); Nasal Congestion (x 2 days); Generalized Body Aches; and Ear Pain (bilateral)  HPI: Angela Johnson is a 30 y.o. female presenting for Cough (x 2 weeks); Nasal Congestion (x 2 days); Generalized Body Aches; and Ear Pain (bilateral)  Both ears hurt, R ear more than L No fevers Some sinus pressure Daughter with URI then just diagnosed with walking pneumonia today Sore throat starting yesterday  Has not been taking venlafaxine twice a day Didn't help Lots of stress at work and at home Getting panic attacks twice a week apprx, has been about a week since last time Interested in starting alternate medication  Has been feeling more tired since being off of thyroid medication Stopped back in May Had normal TSH following cessation Wonders if thyroid level changed  Relevant past medical, surgical, family and social history reviewed. Allergies and medications reviewed and updated. History  Smoking Status  . Former Smoker  . Quit date: 03/31/2005  Smokeless Tobacco  . Never Used   ROS: Per HPI   Objective:    BP 139/83   Pulse 91   Temp 98.7 F (37.1 C) (Oral)   Ht 5\' 10"  (1.778 m)   Wt (!) 319 lb (144.7 kg)   BMI 45.77 kg/m   Wt Readings from Last 3 Encounters:  02/04/16 (!) 319 lb (144.7 kg)  10/08/15 (!) 318 lb (144.2 kg)  08/25/15 (!) 316 lb (143.3 kg)    Gen: NAD, alert, cooperative with exam, NCAT EYES: EOMI, no conjunctival injection, or no icterus ENT: R TM red, splayed LR, L TM [pink, OP with milderythema LYMPH: no cervical LAD CV: NRRR, normal S1/S2, no murmur, distal pulses 2+ b/l Resp: CTABL, no wheezes, normal WOB Ext: No edema, warm Neuro: Alert and oriented, strength equal b/l UE and LE, coordination grossly normal MSK: normal muscle bulk Psych: full affect, tearful at times  Assessment & Plan:  Angela Johnson was seen today for cough, nasal  congestion, generalized body aches and ear pain.  Diagnoses and all orders for this visit:  Acute URI Flu test negative Discussed symptomatic care  Body aches -     Veritor Flu A/B Waived  Cough -     Veritor Flu A/B Waived  Right acute serous otitis media, recurrence not specified -     amoxicillin (AMOXIL) 500 MG capsule; Take 1 capsule (500 mg total) by mouth 2 (two) times daily.  Nasal congestion -     Veritor Flu A/B Waived  Anxiety state Stop venlafaxine Start celexa, once a day dosing pt thinks will help with compliance RTC 8 weeks, sooner if needed -     citalopram (CELEXA) 20 MG tablet; Take half tab for 8 days then full tab  Other fatigue Repeat thyroid panel, now off of thyroid medication for 6 mo -     Thyroid Panel With TSH; Future   Follow up plan: Return in about 8 weeks (around 03/31/2016). Angela Krasarol Nicey Krah, MD [facility]

## 2016-09-02 ENCOUNTER — Ambulatory Visit: Payer: BLUE CROSS/BLUE SHIELD | Admitting: Pediatrics

## 2016-09-02 ENCOUNTER — Other Ambulatory Visit: Payer: BLUE CROSS/BLUE SHIELD

## 2016-09-02 DIAGNOSIS — R5383 Other fatigue: Secondary | ICD-10-CM | POA: Diagnosis not present

## 2016-09-03 LAB — THYROID PANEL WITH TSH
FREE THYROXINE INDEX: 3.5 (ref 1.2–4.9)
T3 Uptake Ratio: 39 % (ref 24–39)
T4, Total: 9.1 ug/dL (ref 4.5–12.0)
TSH: 4.14 u[IU]/mL (ref 0.450–4.500)

## 2016-09-23 DIAGNOSIS — Z124 Encounter for screening for malignant neoplasm of cervix: Secondary | ICD-10-CM | POA: Diagnosis not present

## 2016-09-23 DIAGNOSIS — Z01419 Encounter for gynecological examination (general) (routine) without abnormal findings: Secondary | ICD-10-CM | POA: Diagnosis not present

## 2016-09-23 DIAGNOSIS — Z6841 Body Mass Index (BMI) 40.0 and over, adult: Secondary | ICD-10-CM | POA: Diagnosis not present

## 2016-10-17 ENCOUNTER — Ambulatory Visit (INDEPENDENT_AMBULATORY_CARE_PROVIDER_SITE_OTHER): Payer: BLUE CROSS/BLUE SHIELD

## 2016-10-17 ENCOUNTER — Ambulatory Visit (INDEPENDENT_AMBULATORY_CARE_PROVIDER_SITE_OTHER): Payer: BLUE CROSS/BLUE SHIELD | Admitting: Physician Assistant

## 2016-10-17 ENCOUNTER — Encounter: Payer: Self-pay | Admitting: Physician Assistant

## 2016-10-17 VITALS — BP 127/75 | HR 77 | Temp 97.7°F | Ht 70.0 in | Wt 312.0 lb

## 2016-10-17 DIAGNOSIS — F411 Generalized anxiety disorder: Secondary | ICD-10-CM | POA: Diagnosis not present

## 2016-10-17 DIAGNOSIS — M7071 Other bursitis of hip, right hip: Secondary | ICD-10-CM | POA: Diagnosis not present

## 2016-10-17 DIAGNOSIS — M25551 Pain in right hip: Secondary | ICD-10-CM

## 2016-10-17 MED ORDER — ALPRAZOLAM 0.5 MG PO TABS: 0.5000 mg | ORAL_TABLET | Freq: Two times a day (BID) | ORAL | 0 refills | Status: DC | PRN

## 2016-10-17 MED ORDER — CITALOPRAM HYDROBROMIDE 20 MG PO TABS: ORAL_TABLET | ORAL | 3 refills | Status: DC

## 2016-10-17 MED ORDER — MELOXICAM 15 MG PO TABS: 15.0000 mg | ORAL_TABLET | Freq: Every day | ORAL | 0 refills | Status: DC

## 2016-10-17 NOTE — Progress Notes (Signed)
BP 127/75   Pulse 77   Temp 97.7 F (36.5 C) (Oral)   Ht 5\' 10"  (1.778 m)   Wt (!) 312 lb (141.5 kg)   BMI 44.77 kg/m    Subjective:    Patient ID: Angela Johnson, female    DOB: 01/26/1986, 31 y.o.   MRN: [id number]  HPI: Angela RandKayla L Durfey is a 31 y.o. female presenting on 10/17/2016 for Hip Pain (right) and Leg Pain (right)  She has been having right hip pain for several weeks. It is greatly aggravated by activity. The pain is in the anterior lateral portion of the hip and radiates into the inner thigh. She had a previous history of a degenerative disc problem but it had resolved. She reports the pain is not the same. She has not had any weakness in the leg. It is very stiff and sore after she sits still for very long.  She also reports having chronic anxiety and depression. She would like to try the Celexa again. She states that she did not take it for very long because she was forgetting to take it. But she really wants to try to take it now. We will restart this medication.  Plan to recheck her in 4 weeks  Relevant past medical, surgical, family and social history reviewed and updated as indicated. Allergies and medications reviewed and updated.  Past Medical History:  Diagnosis Date  . Arthritis   . Gonorrhea 2007   Treated  . Herniated disc   . Hypothyroidism   . Irritable bowel syndrome (IBS)     Past Surgical History:  Procedure Laterality Date  . oral     wisdom teeth    Review of Systems  Constitutional: Negative.   HENT: Negative.   Eyes: Negative.   Respiratory: Negative.   Gastrointestinal: Negative.   Genitourinary: Negative.   Musculoskeletal: Positive for arthralgias, gait problem and myalgias.    Allergies as of 10/17/2016      Reactions   Ciprofloxacin Hcl Itching   Latex       Medication List       Accurate as of 10/17/16 11:49 AM. Always use your most recent med list.          ALPRAZolam 0.5 MG tablet Commonly known as:  XANAX Take 1  tablet (0.5 mg total) by mouth 2 (two) times daily as needed for anxiety.   cetirizine 10 MG tablet Commonly known as:  ZYRTEC Take 10 mg by mouth daily as needed. allergies   citalopram 20 MG tablet Commonly known as:  CELEXA Take half tab for 8 days then full tab   fluticasone 50 MCG/ACT nasal spray Commonly known as:  FLONASE Place 2 sprays into both nostrils daily.   meloxicam 15 MG tablet Commonly known as:  MOBIC Take 1 tablet (15 mg total) by mouth daily.   norgestrel-ethinyl estradiol 0.3-30 MG-MCG tablet Commonly known as:  LO/OVRAL,CRYSELLE Take 1 tablet by mouth daily.          Objective:    BP 127/75   Pulse 77   Temp 97.7 F (36.5 C) (Oral)   Ht 5\' 10"  (1.778 m)   Wt (!) 312 lb (141.5 kg)   BMI 44.77 kg/m   Allergies  Allergen Reactions  . Ciprofloxacin Hcl Itching  . Latex     Physical Exam  Constitutional: She is oriented to person, place, and time. She appears well-developed and well-nourished.  HENT:  Head: Normocephalic and atraumatic.  Eyes: Pupils  are equal, round, and reactive to light. Conjunctivae and EOM are normal.  Cardiovascular: Normal rate, regular rhythm, normal heart sounds and intact distal pulses.   Pulmonary/Chest: Effort normal and breath sounds normal.  Abdominal: Soft. Bowel sounds are normal.  Musculoskeletal:       Right hip: She exhibits tenderness. She exhibits normal range of motion, normal strength, no bony tenderness and no swelling.       Legs: Neurological: She is alert and oriented to person, place, and time. She has normal reflexes.  Skin: Skin is warm and dry. No rash noted.  Psychiatric: She has a normal mood and affect. Her behavior is normal. Judgment and thought content normal.    Results for orders placed or performed in visit on 09/02/16  Thyroid Panel With TSH  Result Value Ref Range   TSH 4.140 0.450 - 4.500 uIU/mL   T4, Total 9.1 4.5 - 12.0 ug/dL   T3 Uptake Ratio 39 24 - 39 %   Free Thyroxine  Index 3.5 1.2 - 4.9      Assessment & Plan:   1. Pain of right hip joint - DG HIP UNILAT W OR W/O PELVIS 2-3 VIEWS RIGHT; Future - meloxicam (MOBIC) 15 MG tablet; Take 1 tablet (15 mg total) by mouth daily.  Dispense: 30 tablet; Refill: 0  2. Anxiety state - citalopram (CELEXA) 20 MG tablet; Take half tab for 8 days then full tab  Dispense: 30 tablet; Refill: 3 - ALPRAZolam (XANAX) 0.5 MG tablet; Take 1 tablet (0.5 mg total) by mouth 2 (two) times daily as needed for anxiety.  Dispense: 20 tablet; Refill: 0  3. Bursitis of right hip, unspecified bursa - meloxicam (MOBIC) 15 MG tablet; Take 1 tablet (15 mg total) by mouth daily.  Dispense: 30 tablet; Refill: 0   Current Outpatient Prescriptions:  .  cetirizine (ZYRTEC) 10 MG tablet, Take 10 mg by mouth daily as needed. allergies, Disp: , Rfl:  .  fluticasone (FLONASE) 50 MCG/ACT nasal spray, Place 2 sprays into both nostrils daily., Disp: 16 g, Rfl: 6 .  norgestrel-ethinyl estradiol (LO/OVRAL,CRYSELLE) 0.3-30 MG-MCG tablet, Take 1 tablet by mouth daily., Disp: , Rfl:  .  ALPRAZolam (XANAX) 0.5 MG tablet, Take 1 tablet (0.5 mg total) by mouth 2 (two) times daily as needed for anxiety., Disp: 20 tablet, Rfl: 0 .  citalopram (CELEXA) 20 MG tablet, Take half tab for 8 days then full tab, Disp: 30 tablet, Rfl: 3 .  meloxicam (MOBIC) 15 MG tablet, Take 1 tablet (15 mg total) by mouth daily., Disp: 30 tablet, Rfl: 0 Continue all other maintenance medications as listed above.  Follow up plan: Return in about 4 weeks (around 11/14/2016) for recheck.  Educational handout given for bursitis  Remus Loffler PA-C [facility] [address]  [place], [place] [address] [phone]   10/17/2016, 11:49 AM

## 2016-10-17 NOTE — Patient Instructions (Signed)
Bursitis Bursitis is when the fluid-filled sac (bursa) that covers and protects a joint is swollen (inflamed). Bursitis is most common near joints, especially the knees, elbows, hips, and shoulders. Follow these instructions at home:  Take medicines only as told by your doctor.  If you were prescribed an antibiotic medicine, finish it all even if you start to feel better.  Rest the affected area as told by your doctor. ? Keep the area raised up. ? Avoid doing things that make the pain worse.  Apply ice to the injured area: ? Place ice in a plastic bag. ? Place a towel between your skin and the bag. ? Leave the ice on for 20 minutes, 2-3 times a day.  Use splints, braces, pads, or walking aids as told by your doctor.  Keep all follow-up visits as told by your doctor. This is important. Contact a doctor if:  You have more pain with home care.  You have a fever.  You have chills. This information is not intended to replace advice given to you by your health care provider. Make sure you discuss any questions you have with your health care provider. Document Released: 09/04/2009 Document Revised: 08/23/2015 Document Reviewed: 06/06/2013 Elsevier Interactive Patient Education  2018 [employer] Achilles Tendinitis Achilles tendinitis is inflammation of the tough, cord-like band that attaches the lower leg muscles to the heel bone (Achilles tendon). This is usually caused by overusing the tendon and the ankle joint. Achilles tendinitis usually gets better over time with treatment and caring for yourself at home. It can take weeks or months to heal completely. What are the causes? This condition may be caused by:  A sudden increase in exercise or activity, such as running.  Doing the same exercises or activities (such as jumping) over and over.  Not warming up calf muscles before exercising.  Exercising in shoes that are worn out or not made for exercise.  Having arthritis or a  bone growth (spur) on the back of the heel bone. This can rub against the tendon and hurt it.  Age-related wear and tear. Tendons become less flexible with age and more likely to be injured.  What are the signs or symptoms? Common symptoms of this condition include:  Pain in the Achilles tendon or in the back of the leg, just above the heel. The pain usually gets worse with exercise.  Stiffness or soreness in the back of the leg, especially in the morning.  Swelling of the skin over the Achilles tendon.  Thickening of the tendon.  Bone spurs at the bottom of the Achilles tendon, near the heel.  Trouble standing on tiptoe.  How is this diagnosed? This condition is diagnosed based on your symptoms and a physical exam. You may have tests, including:  X-rays.  MRI.  How is this treated? The goal of treatment is to relieve symptoms and help your injury heal. Treatment may include:  Decreasing or stopping activities that caused the tendinitis. This may mean switching to low-impact exercises like biking or swimming.  Icing the injured area.  Doing physical therapy, including strengthening and stretching exercises.  NSAIDs to help relieve pain and swelling.  Using supportive shoes, wraps, heel lifts, or a walking boot (air cast).  Surgery. This may be done if your symptoms do not improve after 6 months.  Using high-energy shock wave impulses to stimulate the healing process (extracorporeal shock wave therapy). This is rare.  Injection of medicines to help relieve inflammation (corticosteroids). This  is rare.  Follow these instructions at home: If you have an air cast:  Wear the cast as told by your health care provider. Remove it only as told by your health care provider.  Loosen the cast if your toes tingle, become numb, or turn cold and blue. Activity  Gradually return to your normal activities once your health care provider approves. Do not do activities that cause  pain. ? Consider doing low-impact exercises, like cycling or swimming.  If you have an air cast, ask your health care provider when it is safe for you to drive.  If physical therapy was prescribed, do exercises as told by your health care provider or physical therapist. Managing pain, stiffness, and swelling  Raise (elevate) your foot above the level of your heart while you are sitting or lying down.  Move your toes often to avoid stiffness and to lessen swelling.  If directed, put ice on the injured area: ? Put ice in a plastic bag. ? Place a towel between your skin and the bag. ? Leave the ice on for 20 minutes, 2-3 times a day General instructions  If directed, wrap your foot with an elastic bandage or other wrap. This can help keep your tendon from moving too much while it heals. Your health care provider will show you how to wrap your foot correctly.  Wear supportive shoes or heel lifts only as told by your health care provider.  Take over-the-counter and prescription medicines only as told by your health care provider.  Keep all follow-up visits as told by your health care provider. This is important. Contact a health care provider if:  You have symptoms that gets worse.  You have pain that does not get better with medicine.  You develop new, unexplained symptoms.  You develop warmth and swelling in your foot.  You have a fever. Get help right away if:  You have a sudden popping sound or sensation in your Achilles tendon followed by severe pain.  You cannot move your toes or foot.  You cannot put any weight on your foot. Summary  Achilles tendinitis is inflammation of the tough, cord-like band that attaches the lower leg muscles to the heel bone (Achilles tendon).  This condition is usually caused by overusing the tendon and the ankle joint. It can also be caused by arthritis or normal aging.  The most common symptoms of this condition include pain, swelling, or  stiffness in the Achilles tendon or in the back of the leg.  This condition is usually treated with rest, NSAIDs, and physical therapy. This information is not intended to replace advice given to you by your health care provider. Make sure you discuss any questions you have with your health care provider. Document Released: 12/25/2004 Document Revised: 02/04/2016 Document Reviewed: 02/04/2016 Elsevier Interactive Patient Education  2017 [employer]

## 2016-11-13 ENCOUNTER — Other Ambulatory Visit: Payer: Self-pay | Admitting: Physician Assistant

## 2016-11-13 DIAGNOSIS — M7071 Other bursitis of hip, right hip: Secondary | ICD-10-CM

## 2016-11-13 DIAGNOSIS — M25551 Pain in right hip: Secondary | ICD-10-CM

## 2016-12-31 ENCOUNTER — Ambulatory Visit (INDEPENDENT_AMBULATORY_CARE_PROVIDER_SITE_OTHER): Payer: BLUE CROSS/BLUE SHIELD | Admitting: Physician Assistant

## 2016-12-31 ENCOUNTER — Encounter: Payer: Self-pay | Admitting: Physician Assistant

## 2016-12-31 VITALS — BP 143/83 | HR 93 | Temp 97.5°F | Ht 70.0 in | Wt 321.2 lb

## 2016-12-31 DIAGNOSIS — J40 Bronchitis, not specified as acute or chronic: Secondary | ICD-10-CM

## 2016-12-31 DIAGNOSIS — H6690 Otitis media, unspecified, unspecified ear: Secondary | ICD-10-CM | POA: Diagnosis not present

## 2016-12-31 MED ORDER — ALBUTEROL SULFATE (2.5 MG/3ML) 0.083% IN NEBU: 2.5000 mg | INHALATION_SOLUTION | Freq: Four times a day (QID) | RESPIRATORY_TRACT | 1 refills | Status: DC | PRN

## 2016-12-31 MED ORDER — HYDROCODONE-HOMATROPINE 5-1.5 MG/5ML PO SYRP: 5.0000 mL | ORAL_SOLUTION | Freq: Four times a day (QID) | ORAL | 0 refills | Status: DC | PRN

## 2016-12-31 MED ORDER — DOXYCYCLINE HYCLATE 100 MG PO TABS: 100.0000 mg | ORAL_TABLET | Freq: Two times a day (BID) | ORAL | 0 refills | Status: DC

## 2016-12-31 MED ORDER — METHYLPREDNISOLONE ACETATE 80 MG/ML IJ SUSP
80.0000 mg | Freq: Once | INTRAMUSCULAR | Status: AC
  Administered 2016-12-31: 80 mg via INTRAMUSCULAR

## 2016-12-31 NOTE — Progress Notes (Signed)
BP (!) 143/83   Pulse 93   Temp (!) 97.5 F (36.4 C) (Oral)   Ht  (1.778 m)   Wt (!) 321 lb 3.2 oz (145.7 kg)   BMI 46.09 kg/m    Subjective:    Patient ID: Angela Johnson, female    DOB: [birth year], 31 y.o.   MRN: [id number]  HPI: [name] is a 31 y.o. female presenting on 12/31/2016 for Cough (vomiting ); Nasal Congestion; and Excessive Sweating  Patient with several days of progressing upper respiratory and bronchial symptoms. Initially there was more upper respiratory congestion. This progressed to having significant cough that is productive throughout the day and severe at night. There is occasional wheezing after coughing. Sometimes there is slight dyspnea on exertion. It is productive mucus that is yellow in color. Denies any blood.  Relevant past medical, surgical, family and social history reviewed and updated as indicated. Allergies and medications reviewed and updated.  Past Medical History:  Diagnosis Date  . Arthritis   . Gonorrhea 2007   Treated  . Herniated disc   . Hypothyroidism   . Irritable bowel syndrome (IBS)     Past Surgical History:  Procedure Laterality Date  . oral     wisdom teeth    Review of Systems  Constitutional: Negative.  Negative for activity change, fatigue and fever.  HENT: Negative.   Eyes: Negative.   Respiratory: Negative.  Negative for cough.   Cardiovascular: Negative.  Negative for chest pain.  Gastrointestinal: Negative.  Negative for abdominal pain.  Endocrine: Negative.   Genitourinary: Negative.  Negative for dysuria.  Musculoskeletal: Negative.   Skin: Negative.   Neurological: Negative.     Allergies as of 12/31/2016      Reactions   Ciprofloxacin Hcl Itching   Latex       Medication List       Accurate as of 12/31/16  6:13 PM. Always use your most recent med list.          albuterol (2.5 MG/3ML) 0.083% nebulizer solution Commonly known as:  PROVENTIL Take 3 mLs (2.5 mg total) by  nebulization every 6 (six) hours as needed for wheezing or shortness of breath.   ALPRAZolam 0.5 MG tablet Commonly known as:  XANAX Take 1 tablet (0.5 mg total) by mouth 2 (two) times daily as needed for anxiety.   cetirizine 10 MG tablet Commonly known as:  ZYRTEC Take 10 mg by mouth daily as needed. allergies   citalopram 20 MG tablet Commonly known as:  CELEXA Take half tab for 8 days then full tab   doxycycline 100 MG tablet Commonly known as:  VIBRA-TABS Take 1 tablet (100 mg total) by mouth 2 (two) times daily.   fluticasone 50 MCG/ACT nasal spray Commonly known as:  FLONASE Place 2 sprays into both nostrils daily.   HYDROcodone-homatropine 5-1.5 MG/5ML syrup Commonly known as:  HYCODAN Take 5-10 mLs by mouth every 6 (six) hours as needed.   meloxicam 15 MG tablet Commonly known as:  MOBIC TAKE 1 TABLET BY MOUTH DAILY   norgestrel-ethinyl estradiol 0.3-30 MG-MCG tablet Commonly known as:  LO/OVRAL,CRYSELLE Take 1 tablet by mouth daily.          Objective:    BP (!) 143/83   Pulse 93   Temp (!) 97.5 F (36.4 C) (Oral)   Ht  (1.778 m)   Wt (!) 321 lb 3.2 oz (145.7 kg)   BMI 46.09 kg/m   Allergies  Allergen Reactions  . Ciprofloxacin Hcl Itching  . Latex     Physical Exam  Constitutional: She is oriented to person, place, and time. She appears well-developed and well-nourished.  HENT:  Head: Normocephalic and atraumatic.  Right Ear: There is drainage and tenderness.  Left Ear: There is drainage and tenderness.  Nose: Mucosal edema and rhinorrhea present. Right sinus exhibits maxillary sinus tenderness and frontal sinus tenderness. Left sinus exhibits maxillary sinus tenderness and frontal sinus tenderness.  Mouth/Throat: Oropharyngeal exudate and posterior oropharyngeal erythema present.  Eyes: Pupils are equal, round, and reactive to light. Conjunctivae and EOM are normal.  Neck: Normal range of motion. Neck supple.  Cardiovascular: Normal  rate, regular rhythm, normal heart sounds and intact distal pulses.   Pulmonary/Chest: Effort normal. She has wheezes in the right upper field and the left upper field.  Abdominal: Soft. Bowel sounds are normal.  Neurological: She is alert and oriented to person, place, and time. She has normal reflexes.  Skin: Skin is warm and dry. No rash noted.  Psychiatric: She has a normal mood and affect. Her behavior is normal. Judgment and thought content normal.    Results for orders placed or performed in visit on 09/02/16  Thyroid Panel With TSH  Result Value Ref Range   TSH 4.140 0.450 - 4.500 uIU/mL   T4, Total 9.1 4.5 - 12.0 ug/dL   T3 Uptake Ratio 39 24 - 39 %   Free Thyroxine Index 3.5 1.2 - 4.9      Assessment & Plan:   1. Bronchitis - doxycycline (VIBRA-TABS) 100 MG tablet; Take 1 tablet (100 mg total) by mouth 2 (two) times daily.  Dispense: 20 tablet; Refill: 0 - albuterol (PROVENTIL) (2.5 MG/3ML) 0.083% nebulizer solution; Take 3 mLs (2.5 mg total) by nebulization every 6 (six) hours as needed for wheezing or shortness of breath.  Dispense: 150 mL; Refill: 1 - HYDROcodone-homatropine (HYCODAN) 5-1.5 MG/5ML syrup; Take 5-10 mLs by mouth every 6 (six) hours as needed.  Dispense: 240 mL; Refill: 0 - methylPREDNISolone acetate (DEPO-MEDROL) injection 80 mg; Inject 1 mL (80 mg total) into the muscle once.  2. Acute otitis media, unspecified otitis media type    Current Outpatient Prescriptions:  .  ALPRAZolam (XANAX) 0.5 MG tablet, Take 1 tablet (0.5 mg total) by mouth 2 (two) times daily as needed for anxiety., Disp: 20 tablet, Rfl: 0 .  cetirizine (ZYRTEC) 10 MG tablet, Take 10 mg by mouth daily as needed. allergies, Disp: , Rfl:  .  citalopram (CELEXA) 20 MG tablet, Take half tab for 8 days then full tab, Disp: 30 tablet, Rfl: 3 .  fluticasone (FLONASE) 50 MCG/ACT nasal spray, Place 2 sprays into both nostrils daily., Disp: 16 g, Rfl: 6 .  meloxicam (MOBIC) 15 MG tablet, TAKE 1  TABLET BY MOUTH DAILY, Disp: 30 tablet, Rfl: 1 .  norgestrel-ethinyl estradiol (LO/OVRAL,CRYSELLE) 0.3-30 MG-MCG tablet, Take 1 tablet by mouth daily., Disp: , Rfl:  .  albuterol (PROVENTIL) (2.5 MG/3ML) 0.083% nebulizer solution, Take 3 mLs (2.5 mg total) by nebulization every 6 (six) hours as needed for wheezing or shortness of breath., Disp: 150 mL, Rfl: 1 .  doxycycline (VIBRA-TABS) 100 MG tablet, Take 1 tablet (100 mg total) by mouth 2 (two) times daily., Disp: 20 tablet, Rfl: 0 .  HYDROcodone-homatropine (HYCODAN) 5-1.5 MG/5ML syrup, Take 5-10 mLs by mouth every 6 (six) hours as needed., Disp: 240 mL, Rfl: 0 Continue all other maintenance medications as listed above.  Follow up plan:  Return if symptoms worsen or fail to improve.  Educational handout given for survey  Remus Loffler PA-C [facility] [address]  [place], [place] [address] [phone]   12/31/2016, 6:13 PM

## 2016-12-31 NOTE — Patient Instructions (Signed)
In a few days you may receive a survey in the mail or online from [employer] regarding your visit with us today. Please take a moment to fill this out. Your feedback is very important to our whole office. It can help us better understand your needs as well as improve your experience and satisfaction. Thank you for taking your time to complete it. We care about you.  Coy Vandoren, PA-C  

## 2017-01-25 ENCOUNTER — Other Ambulatory Visit: Payer: Self-pay | Admitting: Physician Assistant

## 2017-01-25 DIAGNOSIS — M7071 Other bursitis of hip, right hip: Secondary | ICD-10-CM

## 2017-01-25 DIAGNOSIS — M25551 Pain in right hip: Secondary | ICD-10-CM

## 2017-02-25 ENCOUNTER — Other Ambulatory Visit: Payer: Self-pay | Admitting: Physician Assistant

## 2017-02-25 DIAGNOSIS — F411 Generalized anxiety disorder: Secondary | ICD-10-CM

## 2017-03-29 ENCOUNTER — Other Ambulatory Visit: Payer: Self-pay | Admitting: Physician Assistant

## 2017-03-29 DIAGNOSIS — M25551 Pain in right hip: Secondary | ICD-10-CM

## 2017-03-29 DIAGNOSIS — M7071 Other bursitis of hip, right hip: Secondary | ICD-10-CM

## 2017-04-29 ENCOUNTER — Other Ambulatory Visit: Payer: Self-pay | Admitting: Physician Assistant

## 2017-04-29 DIAGNOSIS — F411 Generalized anxiety disorder: Secondary | ICD-10-CM

## 2017-04-30 ENCOUNTER — Other Ambulatory Visit: Payer: Self-pay | Admitting: Physician Assistant

## 2017-04-30 DIAGNOSIS — M7071 Other bursitis of hip, right hip: Secondary | ICD-10-CM

## 2017-04-30 DIAGNOSIS — M25551 Pain in right hip: Secondary | ICD-10-CM

## 2017-05-15 ENCOUNTER — Encounter: Payer: Self-pay | Admitting: Family Medicine

## 2017-05-15 ENCOUNTER — Ambulatory Visit: Payer: BLUE CROSS/BLUE SHIELD | Admitting: Family Medicine

## 2017-05-15 VITALS — BP 156/93 | HR 90 | Temp 97.8°F | Resp 22 | Ht 70.0 in | Wt 336.8 lb

## 2017-05-15 DIAGNOSIS — R030 Elevated blood-pressure reading, without diagnosis of hypertension: Secondary | ICD-10-CM | POA: Diagnosis not present

## 2017-05-15 DIAGNOSIS — J40 Bronchitis, not specified as acute or chronic: Secondary | ICD-10-CM | POA: Diagnosis not present

## 2017-05-15 MED ORDER — AZITHROMYCIN 250 MG PO TABS: ORAL_TABLET | ORAL | 0 refills | Status: DC

## 2017-05-15 NOTE — Patient Instructions (Signed)
I have prescribed you a Z-Pak.  As we discussed, azithromycin and citalopram in combination can potentially cause heart arrhythmias.  If you have any abnormal sensations, feel dizzy please seek immediate medical attention and discontinue azithromycin immediately  I recommend that you only use cold medications that are safe in high blood pressure like Coricidin (generic is fine).  Other cold medications can increase your blood pressure.    - Get plenty of rest and drink plenty of fluids. - Try to breathe moist air. Use a cold mist humidifier. - Consume warm fluids (soup or tea) to provide relief for a stuffy nose and to loosen phlegm. - For nasal stuffiness, try saline nasal spray or a Neti Pot.  Afrin nasal spray can also be used but this product should not be used longer than 3 days or it will cause rebound nasal stuffiness (worsening nasal congestion). - For sore throat pain relief: suck on throat lozenges, hard candy or popsicles; gargle with warm salt water (1/4 tsp. salt per 8 oz. of water); and eat soft, bland foods. - Eat a well-balanced diet. If you cannot, ensure you are getting enough nutrients by taking a daily multivitamin. - Avoid dairy products, as they can thicken phlegm. - Avoid alcohol, as it impairs your body's immune system.  CONTACT YOUR DOCTOR IF YOU EXPERIENCE ANY OF THE FOLLOWING: - High fever - Ear pain - Sinus-type headache - Unusually severe cold symptoms - Cough that gets worse while other cold symptoms improve - Flare up of any chronic lung problem, such as asthma - Your symptoms persist longer than 2 weeks

## 2017-05-15 NOTE — Progress Notes (Signed)
Subjective: CC: URI symptoms PCP: Remus Loffler, PA-C ZOX:[id number] Angela Johnson is a 32 y.o. female presenting to clinic today for:  1. Cold symptoms Patient reports deep, productive cough.  She notes chest and sinus congestion.  She reports fevers and diarrhea that is nonbloody.  No nausea or vomiting.  She is tolerating oral intake without difficulty.  Denies myalgia, hemoptysis, shortness of breath, fever.  She reports her daughter was sick with influenza about 2 weeks ago and then subsequently developed an ear infection and bronchitis.    She notes that she has been using NyQuil, Tylenol, Motrin and Robitussin-AC for the last couple of days.  She removes very little improvement with any of the above therapies.  ROS: Per HPI  Allergies  Allergen Reactions  . Ciprofloxacin Hcl Itching  . Latex    Past Medical History:  Diagnosis Date  . Arthritis   . Gonorrhea 2007   Treated  . Herniated disc   . Hypothyroidism   . Irritable bowel syndrome (IBS)     Current Outpatient Medications:  .  ALPRAZolam (XANAX) 0.5 MG tablet, Take 1 tablet (0.5 mg total) by mouth 2 (two) times daily as needed for anxiety., Disp: 20 tablet, Rfl: 0 .  cetirizine (ZYRTEC) 10 MG tablet, Take 10 mg by mouth daily as needed. allergies, Disp: , Rfl:  .  citalopram (CELEXA) 20 MG tablet, TAKE 1/2 TABLET BY MOUTH FOR 8 DAYS THEN FULL TAB, Disp: 30 tablet, Rfl: 0 .  fluticasone (FLONASE) 50 MCG/ACT nasal spray, Place 2 sprays into both nostrils daily., Disp: 16 g, Rfl: 6 .  norgestrel-ethinyl estradiol (LO/OVRAL,CRYSELLE) 0.3-30 MG-MCG tablet, Take 1 tablet by mouth daily., Disp: , Rfl:  .  albuterol (PROVENTIL) (2.5 MG/3ML) 0.083% nebulizer solution, Take 3 mLs (2.5 mg total) by nebulization every 6 (six) hours as needed for wheezing or shortness of breath. (Patient not taking: Reported on 05/15/2017), Disp: 150 mL, Rfl: 1 Social History   Socioeconomic History  . Marital status: Married    Spouse name: Not on  file  . Number of children: Not on file  . Years of education: Not on file  . Highest education level: Not on file  Social Needs  . Financial resource strain: Not on file  . Food insecurity - worry: Not on file  . Food insecurity - inability: Not on file  . Transportation needs - medical: Not on file  . Transportation needs - non-medical: Not on file  Occupational History  . Not on file  Tobacco Use  . Smoking status: Former Smoker    Last attempt to quit: 03/31/2005    Years since quitting: 12.1  . Smokeless tobacco: Never Used  Substance and Sexual Activity  . Alcohol use: No  . Drug use: No  . Sexual activity: Yes  Other Topics Concern  . Not on file  Social History Narrative  . Not on file   Family History  Problem Relation Age of Onset  . Hypertension Mother   . Hyperlipidemia Mother   . Hyperlipidemia Father   . Thyroid disease Father     Objective: Office vital signs reviewed. BP (!) 156/93   Pulse 90   Temp 97.8 F (36.6 C) (Oral)   Resp (!) 22   Ht 5\' 10"  (1.778 m)   Wt (!) 336 lb 12.8 oz (152.8 kg)   SpO2 97%   BMI 48.33 kg/m   Physical Examination:  General: Awake, alert, obese, No acute distress HEENT: Normal  Neck: No masses palpated. No lymphadenopathy    Ears: Tympanic membranes intact, normal light reflex, no erythema, no bulging    Eyes: PERRLA, extraocular membranes intact, sclera white    Nose: nasal turbinates moist, clear nasal discharge    Throat: moist mucus membranes, mild oropharyngeal erythema, no tonsillar exudate.  Airway is patent Cardio: regular rate and rhythm, S1S2 heard, no murmurs appreciated Pulm: Breath sounds difficult to auscultate secondary to body habitus.  She has normal work of breathing on room air.  No wheeze, rhonchi or rales appreciated.  Normal pulse ox noted.  Assessment/ Plan: 32 y.o. female   1. Bronchitis Patient's respiratory rate on my evaluation is actually within normal limits at 16.  Suspect that  initial elevated respiratory rate was related to ambulation into the room.  Her pulse ox is normal on room air.  She is afebrile with slight elevation in her blood pressure.  She will follow-up with her PCP with regards to rechecking blood pressure in 2 weeks.  I did advise her to avoid OTC medications not intended for high blood pressure.  Given severity of symptoms, will empirically treat with azithromycin times 5 days.  She is on Celexa we did discuss that there is a potential for QT prolongation with Celexa and azithromycin in combination.  She voiced good understanding and wishes to proceed with Z-Pak.  She may continue the Robitussin-AC for cough if she finds is helpful.  She actually is not using her Xanax lately so this should be relatively safe in the absence of benzodiazepine use.  Push oral fluids.  Home care instructions were reviewed and handout was provided.  Meds ordered this encounter  Medications  . azithromycin (ZITHROMAX Z-PAK) 250 MG tablet    Sig: As directed    Dispense:  6 tablet    Refill:  0     Ashly Hulen SkainsM Gottschalk, DO [facility] [phone]

## 2017-05-16 ENCOUNTER — Telehealth: Payer: Self-pay | Admitting: Physician Assistant

## 2017-05-16 MED ORDER — CEFDINIR 300 MG PO CAPS: 300.0000 mg | ORAL_CAPSULE | Freq: Two times a day (BID) | ORAL | 0 refills | Status: DC

## 2017-05-16 NOTE — Telephone Encounter (Signed)
Patient aware.

## 2017-05-16 NOTE — Telephone Encounter (Signed)
Please let patient know that I sent in [facility] for her.

## 2017-05-30 ENCOUNTER — Other Ambulatory Visit: Payer: Self-pay | Admitting: Physician Assistant

## 2017-05-30 DIAGNOSIS — F411 Generalized anxiety disorder: Secondary | ICD-10-CM

## 2017-06-08 ENCOUNTER — Other Ambulatory Visit: Payer: Self-pay | Admitting: Physician Assistant

## 2017-06-08 NOTE — Telephone Encounter (Signed)
Pt will check with [facility], we sent this electronically on 05/30/17 for #[phone]

## 2017-06-15 ENCOUNTER — Ambulatory Visit: Payer: BLUE CROSS/BLUE SHIELD | Admitting: Physician Assistant

## 2017-06-15 ENCOUNTER — Encounter: Payer: Self-pay | Admitting: Physician Assistant

## 2017-06-15 VITALS — BP 119/72 | HR 83 | Temp 98.1°F | Ht 70.0 in | Wt 334.0 lb

## 2017-06-15 DIAGNOSIS — R635 Abnormal weight gain: Secondary | ICD-10-CM

## 2017-06-15 DIAGNOSIS — G4710 Hypersomnia, unspecified: Secondary | ICD-10-CM | POA: Diagnosis not present

## 2017-06-15 DIAGNOSIS — F411 Generalized anxiety disorder: Secondary | ICD-10-CM | POA: Diagnosis not present

## 2017-06-15 DIAGNOSIS — Z6841 Body Mass Index (BMI) 40.0 and over, adult: Secondary | ICD-10-CM | POA: Insufficient documentation

## 2017-06-15 MED ORDER — CITALOPRAM HYDROBROMIDE 40 MG PO TABS: 40.0000 mg | ORAL_TABLET | Freq: Every day | ORAL | 3 refills | Status: DC

## 2017-06-15 NOTE — Patient Instructions (Signed)
In a few days you may receive a survey in the mail or online from [employer] regarding your visit with us today. Please take a moment to fill this out. Your feedback is very important to our whole office. It can help us better understand your needs as well as improve your experience and satisfaction. Thank you for taking your time to complete it. We care about you.  Rhylie Stehr, PA-C  

## 2017-06-16 DIAGNOSIS — G4710 Hypersomnia, unspecified: Secondary | ICD-10-CM | POA: Insufficient documentation

## 2017-06-16 LAB — CMP14+EGFR
A/G RATIO: 1.3 (ref 1.2–2.2)
ALBUMIN: 4 g/dL (ref 3.5–5.5)
ALK PHOS: 63 IU/L (ref 39–117)
ALT: 13 IU/L (ref 0–32)
AST: 19 IU/L (ref 0–40)
BUN / CREAT RATIO: 11 (ref 9–23)
BUN: 10 mg/dL (ref 6–20)
Bilirubin Total: 0.2 mg/dL (ref 0.0–1.2)
CO2: 21 mmol/L (ref 20–29)
CREATININE: 0.92 mg/dL (ref 0.57–1.00)
Calcium: 9.1 mg/dL (ref 8.7–10.2)
Chloride: 103 mmol/L (ref 96–106)
GFR calc Af Amer: 96 mL/min/{1.73_m2} (ref >59)
GFR, EST NON AFRICAN AMERICAN: 83 mL/min/{1.73_m2} (ref >59)
Globulin, Total: 3 g/dL (ref 1.5–4.5)
Glucose: 153 mg/dL — ABNORMAL HIGH (ref 65–99)
POTASSIUM: 3.8 mmol/L (ref 3.5–5.2)
Sodium: 139 mmol/L (ref 134–144)
Total Protein: 7 g/dL (ref 6.0–8.5)

## 2017-06-16 LAB — THYROID PANEL WITH TSH
Free Thyroxine Index: 2.8 (ref 1.2–4.9)
T3 Uptake Ratio: 37 % (ref 24–39)
T4, Total: 7.7 ug/dL (ref 4.5–12.0)
TSH: 3.08 u[IU]/mL (ref 0.450–4.500)

## 2017-06-16 LAB — CBC WITH DIFFERENTIAL/PLATELET
BASOS: 1 %
Basophils Absolute: 0 10*3/uL (ref 0.0–0.2)
EOS (ABSOLUTE): 0.2 10*3/uL (ref 0.0–0.4)
EOS: 4 %
HEMATOCRIT: 35.9 % (ref 34.0–46.6)
HEMOGLOBIN: 12.4 g/dL (ref 11.1–15.9)
Immature Grans (Abs): 0 10*3/uL (ref 0.0–0.1)
Immature Granulocytes: 0 %
LYMPHS ABS: 2.1 10*3/uL (ref 0.7–3.1)
Lymphs: 31 %
MCH: 30.2 pg (ref 26.6–33.0)
MCHC: 34.5 g/dL (ref 31.5–35.7)
MCV: 87 fL (ref 79–97)
MONOCYTES: 4 %
MONOS ABS: 0.2 10*3/uL (ref 0.1–0.9)
NEUTROS ABS: 4.1 10*3/uL (ref 1.4–7.0)
Neutrophils: 60 %
Platelets: 221 10*3/uL (ref 150–379)
RBC: 4.11 x10E6/uL (ref 3.77–5.28)
RDW: 14 % (ref 12.3–15.4)
WBC: 6.6 10*3/uL (ref 3.4–10.8)

## 2017-06-16 LAB — HEMOGLOBIN A1C
ESTIMATED AVERAGE GLUCOSE: 97 mg/dL
Hgb A1c MFr Bld: 5 % (ref 4.8–5.6)

## 2017-06-16 LAB — INSULIN AND C-PEPTIDE, SERUM
C PEPTIDE: 14.2 ng/mL — AB (ref 1.1–4.4)
INSULIN: 158.9 u[IU]/mL — ABNORMAL HIGH (ref 2.6–24.9)

## 2017-06-16 NOTE — Progress Notes (Signed)
BP 119/72 (BP Location: Left Arm)   Pulse 83   Temp 98.1 F (36.7 C) (Oral)   Ht 5' 10" (1.778 m)   Wt (!) 334 lb (151.5 kg)   BMI 47.92 kg/m    Subjective:    Patient ID: Angela Johnson, female    DOB: 1985/06/11, 32 y.o.   MRN: [id number]  HPI: AMAL RENBARGER is a 32 y.o. female presenting on 06/15/2017 for Follow-up and Medication Refill  This patient comes in for periodic recheck on medications and conditions including depression.  Patient comes in for recheck on her medications.  She states she can tell a great difference with citalopram 20 mg.  We discussed that if we need to we can increase it to 40 mg, particularly because she has more anxiety based symptoms.  Patient also having significant fatigue, weight gain, craving of sugar.  She wants to drink soda a lot.  She is trying very hard to not drink any at all.  She gained a lot of weight but has lost some weight in the last couple weeks.  She used to have problems with her thyroid.  We will have labs drawn today..  All medications are reviewed today. There are no reports of any problems with the medications. All of the medical conditions are reviewed and updated.  Lab work is reviewed and will be ordered as medically necessary. There are no new problems reported with today's visit.   Past Medical History:  Diagnosis Date  . Arthritis   . Gonorrhea 2007   Treated  . Herniated disc   . Hypothyroidism   . Irritable bowel syndrome (IBS)    Relevant past medical, surgical, family and social history reviewed and updated as indicated. Interim medical history since our last visit reviewed. Allergies and medications reviewed and updated. DATA REVIEWED: CHART IN EPIC  Family History reviewed for pertinent findings.  Review of Systems  Constitutional: Positive for fatigue and unexpected weight change. Negative for activity change and fever.  HENT: Negative.   Eyes: Negative.   Respiratory: Negative.  Negative for cough.     Cardiovascular: Negative for chest pain, palpitations and leg swelling.  Gastrointestinal: Negative.  Negative for abdominal pain.  Endocrine: Negative.   Genitourinary: Negative.  Negative for dysuria.  Musculoskeletal: Negative.   Skin: Negative.   Neurological: Negative.   Psychiatric/Behavioral: Positive for decreased concentration and sleep disturbance. The patient is nervous/anxious.     Allergies as of 06/15/2017      Reactions   Ciprofloxacin Hcl Itching   Latex    Zithromax [azithromycin]    dizziness      Medication List        Accurate as of 06/15/17 11:59 PM. Always use your most recent med list.          albuterol (2.5 MG/3ML) 0.083% nebulizer solution Commonly known as:  PROVENTIL Take 3 mLs (2.5 mg total) by nebulization every 6 (six) hours as needed for wheezing or shortness of breath.   ALPRAZolam 0.5 MG tablet Commonly known as:  XANAX Take 1 tablet (0.5 mg total) by mouth 2 (two) times daily as needed for anxiety.   cetirizine 10 MG tablet Commonly known as:  ZYRTEC Take 10 mg by mouth daily as needed. allergies   citalopram 40 MG tablet Commonly known as:  CELEXA Take 1 tablet (40 mg total) by mouth daily.   fluticasone 50 MCG/ACT nasal spray Commonly known as:  FLONASE Place 2 sprays into both nostrils  daily.   norgestrel-ethinyl estradiol 0.3-30 MG-MCG tablet Commonly known as:  LO/OVRAL,CRYSELLE Take 1 tablet by mouth daily.          Objective:    BP 119/72 (BP Location: Left Arm)   Pulse 83   Temp 98.1 F (36.7 C) (Oral)   Ht 5' 10" (1.778 m)   Wt (!) 334 lb (151.5 kg)   BMI 47.92 kg/m   Allergies  Allergen Reactions  . Ciprofloxacin Hcl Itching  . Latex   . Zithromax [Azithromycin]     dizziness    Wt Readings from Last 3 Encounters:  06/15/17 (!) 334 lb (151.5 kg)  05/15/17 (!) 336 lb 12.8 oz (152.8 kg)  12/31/16 (!) 321 lb 3.2 oz (145.7 kg)    Physical Exam  Constitutional: She is oriented to person, place, and  time. She appears well-developed and well-nourished.  HENT:  Head: Normocephalic and atraumatic.  Eyes: Conjunctivae and EOM are normal. Pupils are equal, round, and reactive to light.  Cardiovascular: Normal rate, regular rhythm, normal heart sounds and intact distal pulses.  Pulmonary/Chest: Effort normal and breath sounds normal.  Abdominal: Soft. Bowel sounds are normal.  Neurological: She is alert and oriented to person, place, and time. She has normal reflexes.  Skin: Skin is warm and dry. No rash noted.  Psychiatric: She has a normal mood and affect. Her behavior is normal. Judgment and thought content normal.  Nursing note and vitals reviewed.   Results for orders placed or performed in visit on 06/15/17  CBC with Differential/Platelet  Result Value Ref Range   WBC 6.6 3.4 - 10.8 x10E3/uL   RBC 4.11 3.77 - 5.28 x10E6/uL   Hemoglobin 12.4 11.1 - 15.9 g/dL   Hematocrit 35.9 34.0 - 46.6 %   MCV 87 79 - 97 fL   MCH 30.2 26.6 - 33.0 pg   MCHC 34.5 31.5 - 35.7 g/dL   RDW 14.0 12.3 - 15.4 %   Platelets 221 150 - 379 x10E3/uL   Neutrophils 60 Not Estab. %   Lymphs 31 Not Estab. %   Monocytes 4 Not Estab. %   Eos 4 Not Estab. %   Basos 1 Not Estab. %   Neutrophils Absolute 4.1 1.4 - 7.0 x10E3/uL   Lymphocytes Absolute 2.1 0.7 - 3.1 x10E3/uL   Monocytes Absolute 0.2 0.1 - 0.9 x10E3/uL   EOS (ABSOLUTE) 0.2 0.0 - 0.4 x10E3/uL   Basophils Absolute 0.0 0.0 - 0.2 x10E3/uL   Immature Granulocytes 0 Not Estab. %   Immature Grans (Abs) 0.0 0.0 - 0.1 x10E3/uL  CMP14+EGFR  Result Value Ref Range   Glucose 153 (H) 65 - 99 mg/dL   BUN 10 6 - 20 mg/dL   Creatinine, Ser 0.92 0.57 - 1.00 mg/dL   GFR calc non Af Amer 83 >59 mL/min/1.73   GFR calc Af Amer 96 >59 mL/min/1.73   BUN/Creatinine Ratio 11 9 - 23   Sodium 139 134 - 144 mmol/L   Potassium 3.8 3.5 - 5.2 mmol/L   Chloride 103 96 - 106 mmol/L   CO2 21 20 - 29 mmol/L   Calcium 9.1 8.7 - 10.2 mg/dL   Total Protein 7.0 6.0 - 8.5  g/dL   Albumin 4.0 3.5 - 5.5 g/dL   Globulin, Total 3.0 1.5 - 4.5 g/dL   Albumin/Globulin Ratio 1.3 1.2 - 2.2   Bilirubin Total 0.2 0.0 - 1.2 mg/dL   Alkaline Phosphatase 63 39 - 117 IU/L   AST 19 0 - 40   IU/L   ALT 13 0 - 32 IU/L  Insulin and C-Peptide  Result Value Ref Range   INSULIN 158.9 (H) 2.6 - 24.9 uIU/mL   C-Peptide 14.2 (H) 1.1 - 4.4 ng/mL  Thyroid Panel With TSH  Result Value Ref Range   TSH 3.080 0.450 - 4.500 uIU/mL   T4, Total 7.7 4.5 - 12.0 ug/dL   T3 Uptake Ratio 37 24 - 39 %   Free Thyroxine Index 2.8 1.2 - 4.9  Hemoglobin A1c  Result Value Ref Range   Hgb A1c MFr Bld 5.0 4.8 - 5.6 %   Est. average glucose Bld gHb Est-mCnc 97 mg/dL      Assessment & Plan:   1. Anxiety state - citalopram (CELEXA) 40 MG tablet; Take 1 tablet (40 mg total) by mouth daily.  Dispense: 90 tablet; Refill: 3  2. Weight gain - CBC with Differential/Platelet - CMP14+EGFR - Insulin and C-Peptide - Thyroid Panel With TSH - Hemoglobin A1c Consider metformin if she is found to have hyperinsulinemia  3. Hypersomnolence - CBC with Differential/Platelet - CMP14+EGFR - Insulin and C-Peptide - Thyroid Panel With TSH - Hemoglobin A1c  4. Morbid obesity (HCC) - CMP14+EGFR - Insulin and C-Peptide - Thyroid Panel With TSH - Hemoglobin A1c   Continue all other maintenance medications as listed above.  Follow up plan: Return in about 2 months (around 08/15/2017) for recheck.  Educational handout given for [place] PA-C [facility] [address]  [place], [place] [address] [phone]   06/16/2017, 1:18 PM

## 2017-06-17 ENCOUNTER — Other Ambulatory Visit: Payer: Self-pay | Admitting: Physician Assistant

## 2017-06-17 MED ORDER — METFORMIN HCL 500 MG PO TABS: 500.0000 mg | ORAL_TABLET | Freq: Every day | ORAL | 5 refills | Status: DC

## 2017-09-07 ENCOUNTER — Other Ambulatory Visit: Payer: Self-pay | Admitting: Physician Assistant

## 2017-09-07 DIAGNOSIS — F411 Generalized anxiety disorder: Secondary | ICD-10-CM

## 2017-09-28 ENCOUNTER — Ambulatory Visit: Payer: BLUE CROSS/BLUE SHIELD | Admitting: Family Medicine

## 2017-09-28 ENCOUNTER — Ambulatory Visit: Payer: BLUE CROSS/BLUE SHIELD | Admitting: Family

## 2017-09-28 ENCOUNTER — Encounter: Payer: Self-pay | Admitting: Family Medicine

## 2017-09-28 VITALS — BP 126/77 | HR 82 | Temp 98.6°F | Ht 70.0 in | Wt 335.0 lb

## 2017-09-28 DIAGNOSIS — H938X2 Other specified disorders of left ear: Secondary | ICD-10-CM

## 2017-09-28 DIAGNOSIS — R300 Dysuria: Secondary | ICD-10-CM

## 2017-09-28 DIAGNOSIS — N3001 Acute cystitis with hematuria: Secondary | ICD-10-CM

## 2017-09-28 LAB — MICROSCOPIC EXAMINATION
Epithelial Cells (non renal): >10 /hpf — AB (ref 0–10)
RBC, UA: >30 /hpf — AB (ref 0–2)
RENAL EPITHEL UA: NONE SEEN /HPF
WBC, UA: >30 /hpf — AB (ref 0–5)

## 2017-09-28 LAB — URINALYSIS, COMPLETE
Bilirubin, UA: NEGATIVE
GLUCOSE, UA: NEGATIVE
Nitrite, UA: POSITIVE — AB
PH UA: 5.5 (ref 5.0–7.5)
Specific Gravity, UA: >1.03 — ABNORMAL HIGH (ref 1.005–1.030)
UUROB: 0.2 mg/dL (ref 0.2–1.0)

## 2017-09-28 MED ORDER — SULFAMETHOXAZOLE-TRIMETHOPRIM 800-160 MG PO TABS: 1.0000 | ORAL_TABLET | Freq: Two times a day (BID) | ORAL | 0 refills | Status: AC

## 2017-09-28 MED ORDER — PHENAZOPYRIDINE HCL 200 MG PO TABS: 200.0000 mg | ORAL_TABLET | Freq: Three times a day (TID) | ORAL | 0 refills | Status: AC | PRN

## 2017-09-28 MED ORDER — CEFTRIAXONE SODIUM 1 G IJ SOLR
1.0000 g | Freq: Once | INTRAMUSCULAR | Status: AC
  Administered 2017-09-28: 1 g via INTRAMUSCULAR

## 2017-09-28 NOTE — Progress Notes (Signed)
Subjective: CC: UTi PCP: Remus Loffler, PA-C GNF:AOZHY L Mccutcheon is a 32 y.o. female presenting to clinic today for:  1. Urinary symptoms Patient reports a 1 day h/o dysuria, urinary frequency, urinary urgency, hematuria.  Denies fevers, chills, abdominal pain, nausea, vomiting, back pain, vaginal discharge.  She does report some diarrhea.  She thinks inciting event for urinary symptoms was recent coitus.  She forgot to void after.  Patient has used nothing for symptoms.  Patient denies a h/o frequent or recurrent UTIs.    2.  Ear fullness Patient reports a few day history of left-sided ear fullness, citing that it feels like she has water in her ear.  Denies any fevers, chills, URI symptoms.  She takes a daily Zyrtec.  She is going to the beach soon and wanted to make sure that she got checked out.    ROS: Per HPI  Allergies  Allergen Reactions  . Ciprofloxacin Hcl Itching  . Latex   . Zithromax [Azithromycin]     dizziness   Past Medical History:  Diagnosis Date  . Arthritis   . Gonorrhea 2007   Treated  . Herniated disc   . Hypothyroidism   . Irritable bowel syndrome (IBS)     Current Outpatient Medications:  .  albuterol (PROVENTIL) (2.5 MG/3ML) 0.083% nebulizer solution, Take 3 mLs (2.5 mg total) by nebulization every 6 (six) hours as needed for wheezing or shortness of breath., Disp: 150 mL, Rfl: 1 .  ALPRAZolam (XANAX) 0.5 MG tablet, Take 1 tablet (0.5 mg total) by mouth 2 (two) times daily as needed for anxiety., Disp: 20 tablet, Rfl: 0 .  cetirizine (ZYRTEC) 10 MG tablet, Take 10 mg by mouth daily as needed. allergies, Disp: , Rfl:  .  citalopram (CELEXA) 40 MG tablet, Take 1 tablet (40 mg total) by mouth daily., Disp: 90 tablet, Rfl: 3 .  fluticasone (FLONASE) 50 MCG/ACT nasal spray, Place 2 sprays into both nostrils daily., Disp: 16 g, Rfl: 6 .  metFORMIN (GLUCOPHAGE) 500 MG tablet, Take 1 tablet (500 mg total) by mouth daily with breakfast., Disp: 30 tablet, Rfl:  5 .  norgestrel-ethinyl estradiol (LO/OVRAL,CRYSELLE) 0.3-30 MG-MCG tablet, Take 1 tablet by mouth daily., Disp: , Rfl:  Social History   Socioeconomic History  . Marital status: Married    Spouse name: Not on file  . Number of children: Not on file  . Years of education: Not on file  . Highest education level: Not on file  Occupational History  . Not on file  Social Needs  . Financial resource strain: Not on file  . Food insecurity:    Worry: Not on file    Inability: Not on file  . Transportation needs:    Medical: Not on file    Non-medical: Not on file  Tobacco Use  . Smoking status: Former Smoker    Last attempt to quit: 03/31/2005    Years since quitting: 12.5  . Smokeless tobacco: Never Used  Substance and Sexual Activity  . Alcohol use: No  . Drug use: No  . Sexual activity: Yes  Lifestyle  . Physical activity:    Days per week: Not on file    Minutes per session: Not on file  . Stress: Not on file  Relationships  . Social connections:    Talks on phone: Not on file    Gets together: Not on file    Attends religious service: Not on file    Active member of  club or organization: Not on file    Attends meetings of clubs or organizations: Not on file    Relationship status: Not on file  . Intimate partner violence:    Fear of current or ex partner: Not on file    Emotionally abused: Not on file    Physically abused: Not on file    Forced sexual activity: Not on file  Other Topics Concern  . Not on file  Social History Narrative  . Not on file   Family History  Problem Relation Age of Onset  . Hypertension Mother   . Hyperlipidemia Mother   . Hyperlipidemia Father   . Thyroid disease Father     Objective: Office vital signs reviewed. BP 126/77   Pulse 82   Temp 98.6 F (37 C) (Oral)   Ht 5\' 10"  (1.778 m)   Wt (!) 335 lb (152 kg)   BMI 48.07 kg/m   Physical Examination:  General: Awake, alert, nontoxic, No acute distress HEENT: Normal     Neck: No masses palpated. No lymphadenopathy    Ears: Tympanic membranes intact, slightly decreased light reflex on left, no erythema, no bulging    Eyes: PERRLA, extraocular membranes intact, sclera white GU: +suprapubic TTP. No CVA TTP  Assessment/ Plan: 32 y.o. female   1. Acute cystitis with hematuria Patient is afebrile nontoxic-appearing.  No evidence of pyelonephritis on exam.  Her urinalysis was remarkable for 3+ blood, 3+ protein, nitrite positive and 1+ leukocytes.  Urine microscopy with greater than 30 white blood cells, greater than 30 red blood cells and many bacteria.  She also had greater than 10 epithelial cells.  I have sent this for urine culture.  She was given a dose of Rocephin IM 1 g here in office.  I read her last urine culture which demonstrated multidrug-resistant E. coli that was sensitive to sulfa and Macrobid.  Given visible hematuria, I have recommended proceeding with sulfa antibiotic.  She will take this twice a day for the next 7 days.  She will contact her office on Friday for the urine culture results.  Home care instructions were reinforced.  Reasons for return discussed.  Follow-up as needed. - cefTRIAXone (ROCEPHIN) injection 1 g - Urine Culture  2. Dysuria - Urinalysis, Complete  3. Sensation of fullness in left ear No evidence of infection on exam.  She does have a small effusion in the left ear.  Continue oral antihistamine.  Follow-up as needed.   Orders Placed This Encounter  Procedures  . Urine Culture  . Urinalysis, Complete   Meds ordered this encounter  Medications  . cefTRIAXone (ROCEPHIN) injection 1 g  . sulfamethoxazole-trimethoprim (BACTRIM DS) 800-160 MG tablet    Sig: Take 1 tablet by mouth 2 (two) times daily for 7 days.    Dispense:  14 tablet    Refill:  0  . phenazopyridine (PYRIDIUM) 200 MG tablet    Sig: Take 1 tablet (200 mg total) by mouth 3 (three) times daily as needed for up to 2 days for pain.    Dispense:  6 tablet      Refill:  0     Garhett Bernhard Hulen SkainsM Nelli Swalley, DO [facility] [phone]

## 2017-09-28 NOTE — Patient Instructions (Signed)
You are given a dose of Rocephin intramuscularly here in office.  I am sending your urine for urine culture to verify that we have you on the appropriate antibiotic.  I sent in sulfa antibiotic free to take twice a day for the next week.  I have also prescribed you Pyridium to use up to 3 times daily if needed for pain with urination.  Uses only for the next 2 days so as not to mask persistent symptoms.  Please call our office on Friday to check in on your urine culture.   Urinary Tract Infection, Adult A urinary tract infection (UTI) is an infection of any part of the urinary tract. The urinary tract includes the:  Kidneys.  Ureters.  Bladder.  Urethra.  These organs make, store, and get rid of pee (urine) in the body. Follow these instructions at home:  Take over-the-counter and prescription medicines only as told by your doctor.  If you were prescribed an antibiotic medicine, take it as told by your doctor. Do not stop taking the antibiotic even if you start to feel better.  Avoid the following drinks: ? Alcohol. ? Caffeine. ? Tea. ? Carbonated drinks.  Drink enough fluid to keep your pee clear or pale yellow.  Keep all follow-up visits as told by your doctor. This is important.  Make sure to: ? Empty your bladder often and completely. Do not to hold pee for long periods of time. ? Empty your bladder before and after sex. ? Wipe from front to back after a bowel movement if you are female. Use each tissue one time when you wipe. Contact a doctor if:  You have back pain.  You have a fever.  You feel sick to your stomach (nauseous).  You throw up (vomit).  Your symptoms do not get better after 3 days.  Your symptoms go away and then come back. Get help right away if:  You have very bad back pain.  You have very bad lower belly (abdominal) pain.  You are throwing up and cannot keep down any medicines or water. This information is not intended to replace advice  given to you by your health care provider. Make sure you discuss any questions you have with your health care provider. Document Released: 09/03/2007 Document Revised: 08/23/2015 Document Reviewed: 02/05/2015 Elsevier Interactive Patient Education  [employer]2018 Elsevier Inc.

## 2017-09-30 LAB — URINE CULTURE

## 2017-10-02 ENCOUNTER — Telehealth: Payer: Self-pay | Admitting: *Deleted

## 2017-10-02 MED ORDER — FLUCONAZOLE 150 MG PO TABS: 150.0000 mg | ORAL_TABLET | ORAL | 0 refills | Status: DC | PRN

## 2017-10-02 NOTE — Telephone Encounter (Signed)
Diflucan Prescription sent to pharmacy   

## 2017-10-02 NOTE — Telephone Encounter (Signed)
Pt aware.

## 2017-10-02 NOTE — Telephone Encounter (Signed)
Pt has been on Bactrim, she has a raging yeast inf. Dr Timoteo ExposeG's pt. Please call in at [facility], leaving for beach tomorrow

## 2017-10-27 DIAGNOSIS — Z01419 Encounter for gynecological examination (general) (routine) without abnormal findings: Secondary | ICD-10-CM | POA: Diagnosis not present

## 2017-10-27 DIAGNOSIS — Z6841 Body Mass Index (BMI) 40.0 and over, adult: Secondary | ICD-10-CM | POA: Diagnosis not present

## 2017-10-27 DIAGNOSIS — Z124 Encounter for screening for malignant neoplasm of cervix: Secondary | ICD-10-CM | POA: Diagnosis not present

## 2018-02-22 ENCOUNTER — Encounter: Payer: Self-pay | Admitting: Family Medicine

## 2018-02-22 ENCOUNTER — Ambulatory Visit: Payer: BLUE CROSS/BLUE SHIELD | Admitting: Family Medicine

## 2018-02-22 VITALS — BP 128/76 | HR 80 | Temp 97.5°F | Ht 70.0 in | Wt 342.0 lb

## 2018-02-22 DIAGNOSIS — N3090 Cystitis, unspecified without hematuria: Secondary | ICD-10-CM

## 2018-02-22 DIAGNOSIS — K121 Other forms of stomatitis: Secondary | ICD-10-CM | POA: Diagnosis not present

## 2018-02-22 DIAGNOSIS — Z23 Encounter for immunization: Secondary | ICD-10-CM | POA: Diagnosis not present

## 2018-02-22 MED ORDER — MAGIC MOUTHWASH W/LIDOCAINE: 5.0000 mL | Freq: Four times a day (QID) | ORAL | 0 refills | Status: DC

## 2018-02-22 MED ORDER — AMOXICILLIN 875 MG PO TABS: 875.0000 mg | ORAL_TABLET | Freq: Two times a day (BID) | ORAL | 0 refills | Status: AC

## 2018-02-22 NOTE — Progress Notes (Signed)
Chief Complaint  Patient presents with  . Mouth Lesions  . Urinary Tract Infection    HPI  Patient presents today for painful sore in mouth.  2 days ago.  It is located on the underside of her tongue to the right.  She says the pain is intense.  She put some peroxide on it and it turned white and blistered and opened up.  Also has burning with urination and frequency for 2 days. Denies fever . No flank pain. No nausea, vomiting.   PMH: Smoking status noted ROS: Per HPI  Objective: BP 128/76   Pulse 80   Temp (!) 97.5 F (36.4 C)   Ht $RemoveBeforeD ID_[id number]$5\' 10"kg/m  Gen: NAD, alert, cooperative with exam HEENT: NCAT, EOMI, PERRL.  The underside of the tongue has an area of denuded epithelium about 1/2 x 1 cm.  There is mild erythema. Stomatitis stomatitis stomatitis abd: SNTND, BS present, no guarding or organomegaly Ext: No edema, warm Neuro: Alert and oriented, No gross deficits  Assessment and plan:  1. Cystitis   2. Need for immunization against influenza   3. Stomatitis, ulcerative     Meds ordered this encounter  Medications  . amoxicillin (AMOXIL) 875 MG tablet    Sig: Take 1 tablet (875 mg total) by mouth 2 (two) times daily for 10 days.    Dispense:  20 tablet    Refill:  0  . magic mouthwash w/lidocaine SOLN    Sig: Take 5 mLs by mouth 4 (four) times daily.    Dispense:  240 mL    Refill:  0    Orders Placed This Encounter  Procedures  . Flu Vaccine QUAD 36+ mos IM    Follow up as needed.  Mechele ClaudeWarren Marios Gaiser, MD

## 2018-09-07 ENCOUNTER — Other Ambulatory Visit: Payer: Self-pay | Admitting: Physician Assistant

## 2018-09-07 DIAGNOSIS — F411 Generalized anxiety disorder: Secondary | ICD-10-CM

## 2018-09-08 NOTE — Telephone Encounter (Signed)
Jones. NTBS LOV for Dx 06/15/17. 30 days sent to pharmacy

## 2018-09-08 NOTE — Telephone Encounter (Signed)
Patient scheduled for refill appointment with provider.

## 2018-09-21 ENCOUNTER — Other Ambulatory Visit: Payer: Self-pay

## 2018-09-21 ENCOUNTER — Ambulatory Visit (INDEPENDENT_AMBULATORY_CARE_PROVIDER_SITE_OTHER): Payer: BC Managed Care – PPO | Admitting: Physician Assistant

## 2018-09-21 ENCOUNTER — Encounter: Payer: Self-pay | Admitting: Physician Assistant

## 2018-09-21 DIAGNOSIS — F411 Generalized anxiety disorder: Secondary | ICD-10-CM | POA: Diagnosis not present

## 2018-09-21 DIAGNOSIS — G2581 Restless legs syndrome: Secondary | ICD-10-CM | POA: Diagnosis not present

## 2018-09-21 DIAGNOSIS — Z0000 Encounter for general adult medical examination without abnormal findings: Secondary | ICD-10-CM

## 2018-09-21 DIAGNOSIS — Z72820 Sleep deprivation: Secondary | ICD-10-CM

## 2018-09-21 MED ORDER — ROPINIROLE HCL 0.5 MG PO TABS: 0.5000 mg | ORAL_TABLET | Freq: Every day | ORAL | 5 refills | Status: DC

## 2018-09-21 MED ORDER — CITALOPRAM HYDROBROMIDE 40 MG PO TABS: 40.0000 mg | ORAL_TABLET | Freq: Every day | ORAL | 3 refills | Status: DC

## 2018-09-21 NOTE — Progress Notes (Signed)
Telephone visit  Subjective: XY:[id number] anxiety, sleep, fatigue PCP: Terald Sleeper, PA-C ZSM:OLMBE [name] is a 33 y.o. female calls for telephone consult today. Patient provides verbal consent for consult held via phone.  Patient is identified with 2 separate identifiers.  At this time the entire area is on COVID-19 social distancing and stay home orders are in place.  Patient is of higher risk and therefore we are performing this by a virtual method.  Location of patient: home Location of provider: HOME Others present for call: no  Is having a chronic recheck on her depression anxiety.  She states she has been doing very well with the Celexa and just needs refills sent in we will go ahead and send in a year's worth.  She also complains of having a lot of fatigue, she states that she never feels well whenever she wakes up in the morning.  And she moves around a lot at night.  She is her husband states she kicks a lot.  She has never had a sleep study ever performed.  I have discussed the possibility of restless leg syndrome or sleep movement disorder.  We can try some medication for her and she will come and have labs.   ROS: Per HPI  Allergies  Allergen Reactions  . Ciprofloxacin Hcl Itching  . Latex   . Zithromax [Azithromycin]     dizziness   Past Medical History:  Diagnosis Date  . Arthritis   . Gonorrhea 2007   Treated  . Herniated disc   . Hypothyroidism   . Irritable bowel syndrome (IBS)     Current Outpatient Medications:  .  ALPRAZolam (XANAX) 0.5 MG tablet, Take 1 tablet (0.5 mg total) by mouth 2 (two) times daily as needed for anxiety. (Patient not taking: Reported on 02/22/2018), Disp: 20 tablet, Rfl: 0 .  cetirizine (ZYRTEC) 10 MG tablet, Take 10 mg by mouth daily as needed. allergies, Disp: , Rfl:  .  citalopram (CELEXA) 40 MG tablet, Take 1 tablet (40 mg total) by mouth daily., Disp: 90 tablet, Rfl: 3 .  fluticasone (FLONASE) 50 MCG/ACT nasal spray,  Place 2 sprays into both nostrils daily. (Patient not taking: Reported on 02/22/2018), Disp: 16 g, Rfl: 6 .  norgestrel-ethinyl estradiol (LO/OVRAL,CRYSELLE) 0.3-30 MG-MCG tablet, Take 1 tablet by mouth daily., Disp: , Rfl:  .  rOPINIRole (REQUIP) 0.5 MG tablet, Take 1-4 tablets (0.5-2 mg total) by mouth at bedtime., Disp: 120 tablet, Rfl: 5  Assessment/ Plan: 33 y.o. female   1. Anxiety state - citalopram (CELEXA) 40 MG tablet; Take 1 tablet (40 mg total) by mouth daily.  Dispense: 90 tablet; Refill: 3  2. Well adult exam - CMP14+EGFR; Future - CBC with Differential/Platelet; Future - Lipid panel; Future - TSH; Future  3. Restless legs - rOPINIRole (REQUIP) 0.5 MG tablet; Take 1-4 tablets (0.5-2 mg total) by mouth at bedtime.  Dispense: 120 tablet; Refill: 5  4. Poor sleep - rOPINIRole (REQUIP) 0.5 MG tablet; Take 1-4 tablets (0.5-2 mg total) by mouth at bedtime.  Dispense: 120 tablet; Refill: 5   Continue all other maintenance medications as listed above.  Start time: 3:50 PM End time: 4:08 PM  Meds ordered this encounter  Medications  . citalopram (CELEXA) 40 MG tablet    Sig: Take 1 tablet (40 mg total) by mouth daily.    Dispense:  90 tablet    Refill:  3    depression    Order Specific Question:  Supervising Provider    Answer:   Janora Norlander [[id number]]  . rOPINIRole (REQUIP) 0.5 MG tablet    Sig: Take 1-4 tablets (0.5-2 mg total) by mouth at bedtime.    Dispense:  120 tablet    Refill:  5    Order Specific Question:   Supervising Provider    Answer:   Janora Norlander [[id number]]    Particia Nearing PA-C [place] [phone]

## 2018-10-26 ENCOUNTER — Other Ambulatory Visit: Payer: Self-pay | Admitting: Physician Assistant

## 2018-10-26 ENCOUNTER — Other Ambulatory Visit: Payer: Self-pay | Admitting: *Deleted

## 2018-10-26 ENCOUNTER — Encounter: Payer: Self-pay | Admitting: Physician Assistant

## 2018-10-26 DIAGNOSIS — F411 Generalized anxiety disorder: Secondary | ICD-10-CM

## 2018-10-26 MED ORDER — ALPRAZOLAM 0.5 MG PO TABS: 0.5000 mg | ORAL_TABLET | Freq: Two times a day (BID) | ORAL | 0 refills | Status: DC | PRN

## 2018-10-29 ENCOUNTER — Encounter: Payer: Self-pay | Admitting: Physician Assistant

## 2018-11-04 ENCOUNTER — Other Ambulatory Visit: Payer: Self-pay | Admitting: Physician Assistant

## 2018-11-04 ENCOUNTER — Encounter: Payer: Self-pay | Admitting: Physician Assistant

## 2018-11-04 MED ORDER — FUROSEMIDE 20 MG PO TABS: 20.0000 mg | ORAL_TABLET | Freq: Every day | ORAL | 3 refills | Status: DC

## 2018-11-04 NOTE — Progress Notes (Signed)
Please schedule follow up in 2-4 weeks.  Lasix 20 mg one daily

## 2018-11-05 NOTE — Progress Notes (Signed)
Patient called and detailed message left with appointment date and time.

## 2018-11-15 ENCOUNTER — Other Ambulatory Visit: Payer: Self-pay | Admitting: Physician Assistant

## 2018-11-15 DIAGNOSIS — F411 Generalized anxiety disorder: Secondary | ICD-10-CM

## 2018-11-15 MED ORDER — ALPRAZOLAM 0.5 MG PO TABS: 0.5000 mg | ORAL_TABLET | Freq: Two times a day (BID) | ORAL | 0 refills | Status: DC | PRN

## 2018-11-23 ENCOUNTER — Ambulatory Visit: Payer: BC Managed Care – PPO | Admitting: Physician Assistant

## 2018-12-31 ENCOUNTER — Other Ambulatory Visit: Payer: Self-pay

## 2018-12-31 DIAGNOSIS — Z20822 Contact with and (suspected) exposure to covid-19: Secondary | ICD-10-CM

## 2019-01-01 LAB — NOVEL CORONAVIRUS, NAA: SARS-CoV-2, NAA: NOT DETECTED

## 2019-01-06 ENCOUNTER — Other Ambulatory Visit: Payer: Self-pay

## 2019-01-06 DIAGNOSIS — Z20822 Contact with and (suspected) exposure to covid-19: Secondary | ICD-10-CM

## 2019-01-07 ENCOUNTER — Telehealth: Payer: Self-pay

## 2019-01-07 LAB — NOVEL CORONAVIRUS, NAA: SARS-CoV-2, NAA: DETECTED — AB

## 2019-01-07 NOTE — Telephone Encounter (Signed)
Patient called in requesting [place] lab results  - DOB/Address verified  - advised results pending, no further questions.

## 2019-01-17 ENCOUNTER — Other Ambulatory Visit: Payer: Self-pay

## 2019-01-17 DIAGNOSIS — Z20822 Contact with and (suspected) exposure to covid-19: Secondary | ICD-10-CM

## 2019-01-19 LAB — NOVEL CORONAVIRUS, NAA: SARS-CoV-2, NAA: NOT DETECTED

## 2019-02-16 ENCOUNTER — Encounter: Payer: Self-pay | Admitting: Physician Assistant

## 2019-03-03 ENCOUNTER — Ambulatory Visit: Payer: Self-pay

## 2019-03-03 ENCOUNTER — Other Ambulatory Visit: Payer: Self-pay

## 2019-03-03 ENCOUNTER — Other Ambulatory Visit: Payer: Self-pay | Admitting: Physician Assistant

## 2019-03-03 DIAGNOSIS — F411 Generalized anxiety disorder: Secondary | ICD-10-CM

## 2019-03-07 ENCOUNTER — Ambulatory Visit: Payer: Self-pay

## 2019-03-08 ENCOUNTER — Ambulatory Visit: Payer: Self-pay

## 2019-03-10 ENCOUNTER — Other Ambulatory Visit: Payer: Self-pay | Admitting: Physician Assistant

## 2019-03-10 DIAGNOSIS — F411 Generalized anxiety disorder: Secondary | ICD-10-CM

## 2019-03-10 MED ORDER — ALPRAZOLAM 0.5 MG PO TABS: 0.5000 mg | ORAL_TABLET | Freq: Two times a day (BID) | ORAL | 0 refills | Status: DC | PRN

## 2019-03-10 NOTE — Telephone Encounter (Signed)
Patient called stating that she received a denial on refill request for her Xanax Rx. Patient made appt to see Glenard Haring on 03/14/19 for med refill. Wants to know if [facility] or Nurse can send over enough pills to pharmacy to last patient until she can get full refill.

## 2019-03-10 NOTE — Telephone Encounter (Signed)
Please advise 

## 2019-03-11 ENCOUNTER — Other Ambulatory Visit: Payer: Self-pay

## 2019-03-14 ENCOUNTER — Encounter: Payer: Self-pay | Admitting: Physician Assistant

## 2019-03-14 ENCOUNTER — Ambulatory Visit: Payer: Managed Care, Other (non HMO) | Admitting: Physician Assistant

## 2019-03-14 VITALS — BP 145/84 | HR 74 | Temp 98.4°F | Ht 70.0 in | Wt 361.2 lb

## 2019-03-14 DIAGNOSIS — Z23 Encounter for immunization: Secondary | ICD-10-CM | POA: Diagnosis not present

## 2019-03-14 DIAGNOSIS — F411 Generalized anxiety disorder: Secondary | ICD-10-CM

## 2019-03-14 DIAGNOSIS — R739 Hyperglycemia, unspecified: Secondary | ICD-10-CM

## 2019-03-14 DIAGNOSIS — Z0000 Encounter for general adult medical examination without abnormal findings: Secondary | ICD-10-CM

## 2019-03-14 LAB — BAYER DCA HB A1C WAIVED: HB A1C (BAYER DCA - WAIVED): 5.3 % (ref <7.0)

## 2019-03-14 MED ORDER — TOPIRAMATE 50 MG PO TABS: 50.0000 mg | ORAL_TABLET | Freq: Two times a day (BID) | ORAL | 2 refills | Status: DC

## 2019-03-14 MED ORDER — METFORMIN HCL 500 MG PO TABS: 500.0000 mg | ORAL_TABLET | Freq: Two times a day (BID) | ORAL | 3 refills | Status: DC

## 2019-03-15 ENCOUNTER — Encounter: Payer: Self-pay | Admitting: Physician Assistant

## 2019-03-15 ENCOUNTER — Other Ambulatory Visit: Payer: Self-pay | Admitting: Physician Assistant

## 2019-03-15 LAB — CMP14+EGFR
ALT: 21 IU/L (ref 0–32)
AST: 18 IU/L (ref 0–40)
Albumin/Globulin Ratio: 1.7 (ref 1.2–2.2)
Albumin: 4.2 g/dL (ref 3.8–4.8)
Alkaline Phosphatase: 63 IU/L (ref 39–117)
BUN/Creatinine Ratio: 23 (ref 9–23)
BUN: 16 mg/dL (ref 6–20)
Bilirubin Total: 0.2 mg/dL (ref 0.0–1.2)
CO2: 22 mmol/L (ref 20–29)
Calcium: 9 mg/dL (ref 8.7–10.2)
Chloride: 106 mmol/L (ref 96–106)
Creatinine, Ser: 0.71 mg/dL (ref 0.57–1.00)
GFR calc Af Amer: 129 mL/min/{1.73_m2} (ref >59)
GFR calc non Af Amer: 112 mL/min/{1.73_m2} (ref >59)
Globulin, Total: 2.5 g/dL (ref 1.5–4.5)
Glucose: 94 mg/dL (ref 65–99)
Potassium: 4.4 mmol/L (ref 3.5–5.2)
Sodium: 143 mmol/L (ref 134–144)
Total Protein: 6.7 g/dL (ref 6.0–8.5)

## 2019-03-15 LAB — LIPID PANEL
Chol/HDL Ratio: 5.5 ratio — ABNORMAL HIGH (ref 0.0–4.4)
Cholesterol, Total: 205 mg/dL — ABNORMAL HIGH (ref 100–199)
HDL: 37 mg/dL — ABNORMAL LOW (ref >39)
LDL Chol Calc (NIH): 127 mg/dL — ABNORMAL HIGH (ref 0–99)
Triglycerides: 230 mg/dL — ABNORMAL HIGH (ref 0–149)
VLDL Cholesterol Cal: 41 mg/dL — ABNORMAL HIGH (ref 5–40)

## 2019-03-15 LAB — CBC WITH DIFFERENTIAL/PLATELET
Basophils Absolute: 0 10*3/uL (ref 0.0–0.2)
Basos: 1 %
EOS (ABSOLUTE): 0.3 10*3/uL (ref 0.0–0.4)
Eos: 4 %
Hematocrit: 36.1 % (ref 34.0–46.6)
Hemoglobin: 12.1 g/dL (ref 11.1–15.9)
Immature Grans (Abs): 0 10*3/uL (ref 0.0–0.1)
Immature Granulocytes: 0 %
Lymphocytes Absolute: 2.8 10*3/uL (ref 0.7–3.1)
Lymphs: 36 %
MCH: 30 pg (ref 26.6–33.0)
MCHC: 33.5 g/dL (ref 31.5–35.7)
MCV: 90 fL (ref 79–97)
Monocytes Absolute: 0.5 10*3/uL (ref 0.1–0.9)
Monocytes: 6 %
Neutrophils Absolute: 4.1 10*3/uL (ref 1.4–7.0)
Neutrophils: 53 %
Platelets: 214 10*3/uL (ref 150–450)
RBC: 4.03 x10E6/uL (ref 3.77–5.28)
RDW: 13.5 % (ref 11.7–15.4)
WBC: 7.6 10*3/uL (ref 3.4–10.8)

## 2019-03-15 LAB — TSH: TSH: 6.26 u[IU]/mL — ABNORMAL HIGH (ref 0.450–4.500)

## 2019-03-15 MED ORDER — LEVOTHYROXINE SODIUM 50 MCG PO TABS: 50.0000 ug | ORAL_TABLET | Freq: Every day | ORAL | 0 refills | Status: DC

## 2019-03-17 ENCOUNTER — Encounter: Payer: Self-pay | Admitting: Physician Assistant

## 2019-03-17 DIAGNOSIS — R739 Hyperglycemia, unspecified: Secondary | ICD-10-CM | POA: Insufficient documentation

## 2019-03-17 NOTE — Progress Notes (Signed)
Acute Office Visit  Subjective:    Patient ID: Angela Johnson, female    DOB: 01/06/1986, 33 y.o.   MRN: [id number]  Chief Complaint  Patient presents with  . Medical Management of Chronic Issues    6 month follow up   . Fatigue   The patient is greatly concerned about her weight gain and fatigue and inability to get up and do things and feel motivated. Over a year ago she did do a keto diet and lost about 37 pounds. She reports that she did feel quite good on it. But just did not have the wherewithal to stick with it during the Covid restrictions. She did have a Covid infection a few weeks ago but is doing very well now. She currently has a job change and sent the desk a lot more than normal. But in the new year she plans to work with a trainer three times a week and start keto. Her A1c today was normal. Her mother was recently diagnosed with diabetes so she wants to get on top of this is soon as possible.   Anxiety Presents for follow-up visit. Symptoms include decreased concentration, irritability and nervous/anxious behavior. Patient reports no chest pain. Symptoms occur most days. The severity of symptoms is mild. The quality of sleep is fair. Nighttime awakenings: occasional.       Past Medical History:  Diagnosis Date  . Arthritis   . Gonorrhea 2007   Treated  . Herniated disc   . Hypothyroidism   . Irritable bowel syndrome (IBS)     Past Surgical History:  Procedure Laterality Date  . oral     wisdom teeth    Family History  Problem Relation Age of Onset  . Hypertension Mother   . Hyperlipidemia Mother   . Hyperlipidemia Father   . Thyroid disease Father     Social History   Socioeconomic History  . Marital status: Married    Spouse name: Not on file  . Number of children: Not on file  . Years of education: Not on file  . Highest education level: Not on file  Occupational History  . Not on file  Tobacco Use  . Smoking status: Former Smoker    Quit  date: 03/31/2005    Years since quitting: 13.9  . Smokeless tobacco: Never Used  Substance and Sexual Activity  . Alcohol use: No  . Drug use: No  . Sexual activity: Yes  Other Topics Concern  . Not on file  Social History Narrative  . Not on file   Social Determinants of Health   Financial Resource Strain:   . Difficulty of Paying Living Expenses: Not on file  Food Insecurity:   . Worried About Programme researcher, broadcasting/film/videounning Out of Food in the Last Year: Not on file  . Ran Out of Food in the Last Year: Not on file  Transportation Needs:   . Lack of Transportation (Medical): Not on file  . Lack of Transportation (Non-Medical): Not on file  Physical Activity:   . Days of Exercise per Week: Not on file  . Minutes of Exercise per Session: Not on file  Stress:   . Feeling of Stress : Not on file  Social Connections:   . Frequency of Communication with Friends and Family: Not on file  . Frequency of Social Gatherings with Friends and Family: Not on file  . Attends Religious Services: Not on file  . Active Member of Clubs or Organizations: Not on file  .  Attends Archivist Meetings: Not on file  . Marital Status: Not on file  Intimate Partner Violence:   . Fear of Current or Ex-Partner: Not on file  . Emotionally Abused: Not on file  . Physically Abused: Not on file  . Sexually Abused: Not on file    Outpatient Medications Prior to Visit  Medication Sig Dispense Refill  . ALPRAZolam (XANAX) 0.5 MG tablet Take 1 tablet (0.5 mg total) by mouth 2 (two) times daily as needed for anxiety. 20 tablet 0  . cetirizine (ZYRTEC) 10 MG tablet Take 10 mg by mouth daily as needed. allergies    . citalopram (CELEXA) 40 MG tablet Take 1 tablet (40 mg total) by mouth daily. 90 tablet 3  . fluticasone (FLONASE) 50 MCG/ACT nasal spray Place 2 sprays into both nostrils daily. 16 g 6  . norgestrel-ethinyl estradiol (LO/OVRAL,CRYSELLE) 0.3-30 MG-MCG tablet Take 1 tablet by mouth daily.    . furosemide (LASIX)  20 MG tablet Take 1 tablet (20 mg total) by mouth daily. 30 tablet 3  . rOPINIRole (REQUIP) 0.5 MG tablet Take 1-4 tablets (0.5-2 mg total) by mouth at bedtime. 120 tablet 5   No facility-administered medications prior to visit.    Allergies  Allergen Reactions  . Ciprofloxacin Hcl Itching  . Latex   . Zithromax [Azithromycin]     dizziness    Review of Systems  Constitutional: Positive for fatigue, irritability and unexpected weight change. Negative for activity change and fever.  HENT: Negative.   Eyes: Negative.   Respiratory: Negative.   Cardiovascular: Negative.  Negative for chest pain.  Gastrointestinal: Negative.   Endocrine: Negative.  Negative for polydipsia and polyphagia.  Genitourinary: Negative.   Skin: Negative.   Neurological: Negative.   Psychiatric/Behavioral: Positive for decreased concentration. The patient is nervous/anxious.        Objective:    Physical Exam Constitutional:      General: She is not in acute distress.    Appearance: Normal appearance. She is well-developed.  HENT:     Head: Normocephalic and atraumatic.  Cardiovascular:     Rate and Rhythm: Normal rate.  Pulmonary:     Effort: Pulmonary effort is normal.  Skin:    General: Skin is warm and dry.     Findings: No rash.  Neurological:     Mental Status: She is alert and oriented to person, place, and time.     Deep Tendon Reflexes: Reflexes are normal and symmetric.     BP (!) 145/84   Pulse 74   Temp 98.4 F (36.9 C) (Temporal)   Ht 5\' 10"  (1.778 m)   Wt (!) 361 lb 3.2 oz (163.8 kg)   SpO2 98%   BMI 51.83 kg/m  Wt Readings from Last 3 Encounters:  03/14/19 (!) 361 lb 3.2 oz (163.8 kg)  02/22/18 (!) 342 lb (155.1 kg)  09/28/17 (!) 335 lb (152 kg)    Health Maintenance Due  Topic Date Due  . PAP SMEAR-Modifier  05/01/2017    There are no preventive care reminders to display for this patient.   Lab Results  Component Value Date   TSH 6.260 (H) 03/14/2019    Lab Results  Component Value Date   WBC 7.6 03/14/2019   HGB 12.1 03/14/2019   HCT 36.1 03/14/2019   MCV 90 03/14/2019   PLT 214 03/14/2019   Lab Results  Component Value Date   NA 143 03/14/2019   K 4.4 03/14/2019   CO2  22 03/14/2019   GLUCOSE 94 03/14/2019   BUN 16 03/14/2019   CREATININE 0.71 03/14/2019   BILITOT 0.2 03/14/2019   ALKPHOS 63 03/14/2019   AST 18 03/14/2019   ALT 21 03/14/2019   PROT 6.7 03/14/2019   ALBUMIN 4.2 03/14/2019   CALCIUM 9.0 03/14/2019   Lab Results  Component Value Date   CHOL 205 (H) 03/14/2019   Lab Results  Component Value Date   HDL 37 (L) 03/14/2019   Lab Results  Component Value Date   LDLCALC 127 (H) 03/14/2019   Lab Results  Component Value Date   TRIG 230 (H) 03/14/2019   Lab Results  Component Value Date   CHOLHDL 5.5 (H) 03/14/2019   Lab Results  Component Value Date   HGBA1C 5.3 03/14/2019       Assessment & Plan:   Problem List Items Addressed This Visit      Other   Anxiety state   Morbid obesity (HCC)   Relevant Medications   metFORMIN (GLUCOPHAGE) 500 MG tablet   topiramate (TOPAMAX) 50 MG tablet   Elevated blood sugar - Primary   Relevant Medications   metFORMIN (GLUCOPHAGE) 500 MG tablet   Other Relevant Orders   hgba1c (Completed)    Other Visit Diagnoses    Well adult exam       Need for immunization against influenza       Relevant Orders   Flu Vaccine QUAD 36+ mos IM (Completed)       Meds ordered this encounter  Medications  . metFORMIN (GLUCOPHAGE) 500 MG tablet    Sig: Take 1 tablet (500 mg total) by mouth 2 (two) times daily with a meal.    Dispense:  180 tablet    Refill:  3    Order Specific Question:   Supervising Provider    Answer:   Raliegh Ip [[id number]]  . topiramate (TOPAMAX) 50 MG tablet    Sig: Take 1 tablet (50 mg total) by mouth 2 (two) times daily.    Dispense:  60 tablet    Refill:  2    Order Specific Question:   Supervising Provider    Answer:    Raliegh Ip [[id number]]     Remus Loffler, PA-C

## 2019-04-29 IMAGING — DX DG HIP (WITH OR WITHOUT PELVIS) 2-3V*R*
5 series · 5 of 5 positions shown · non-contrast
Comparison: None.

CLINICAL DATA: Right hip pain radiating down the leg.

EXAM:
DG HIP (WITH OR WITHOUT PELVIS) 2-3V RIGHT

[pelvis ap (1 of 2)]
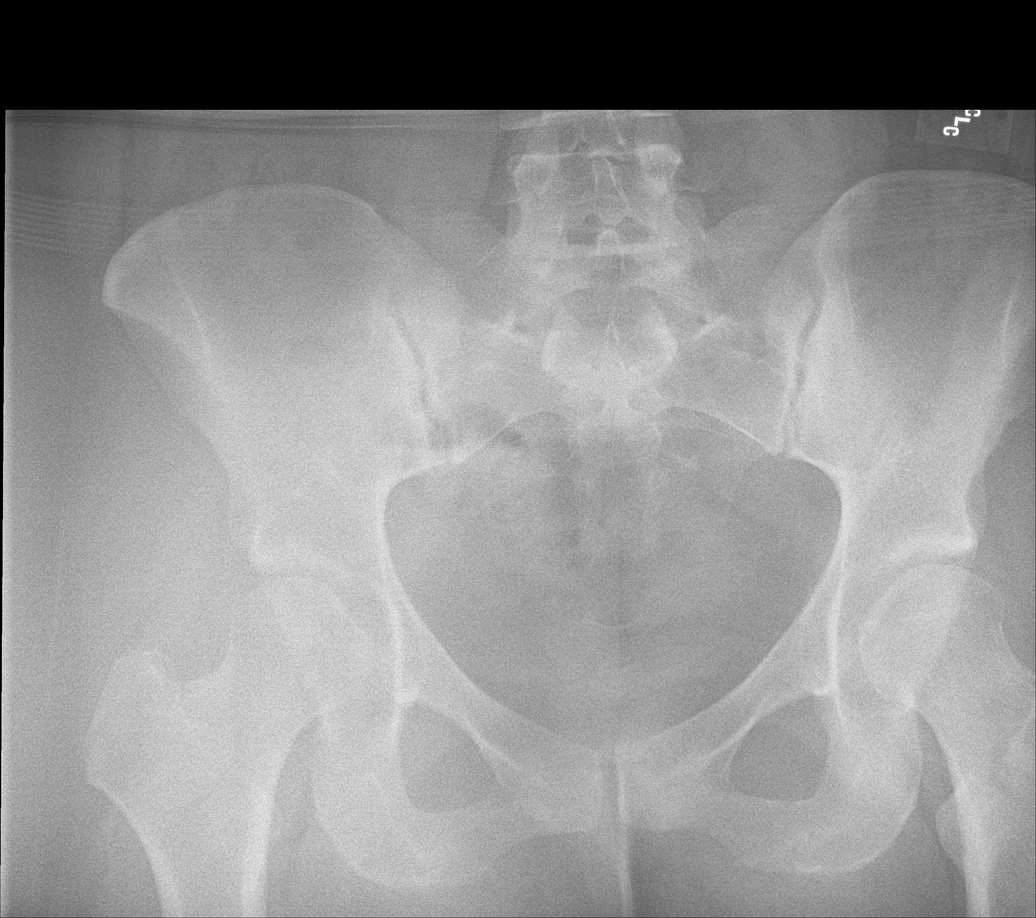

[hip ap (1 of 2)]
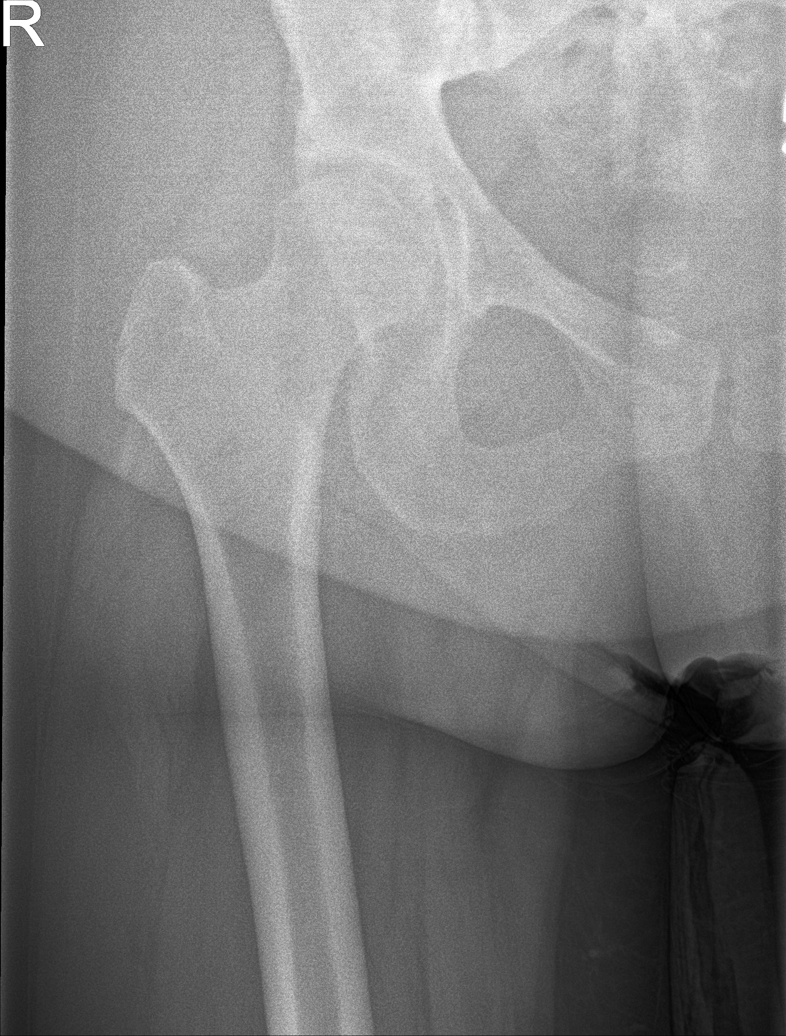

[hip lat]
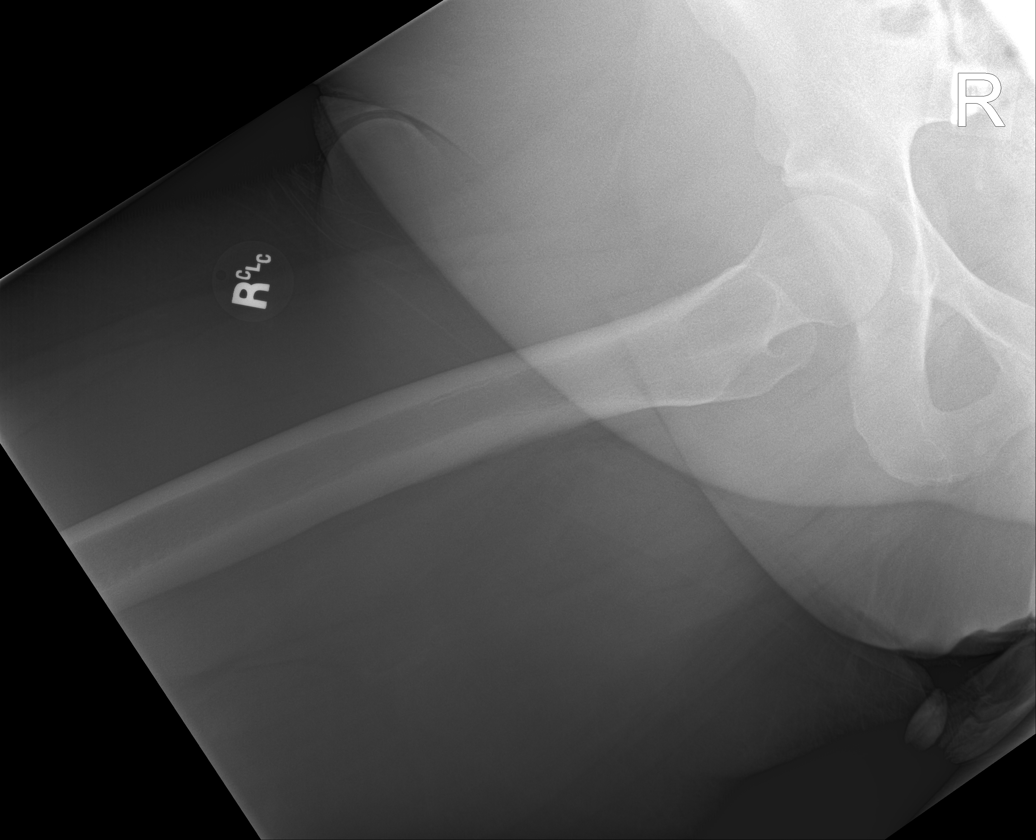

[pelvis ap (2 of 2)]
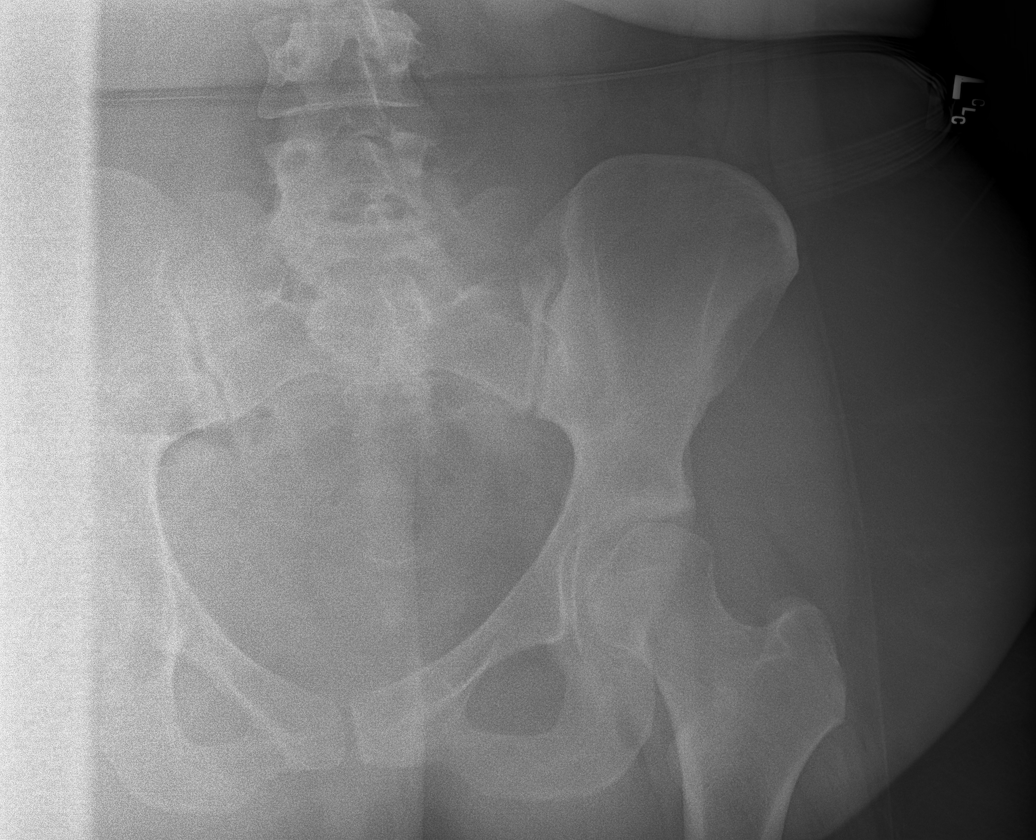

[hip ap (2 of 2)]
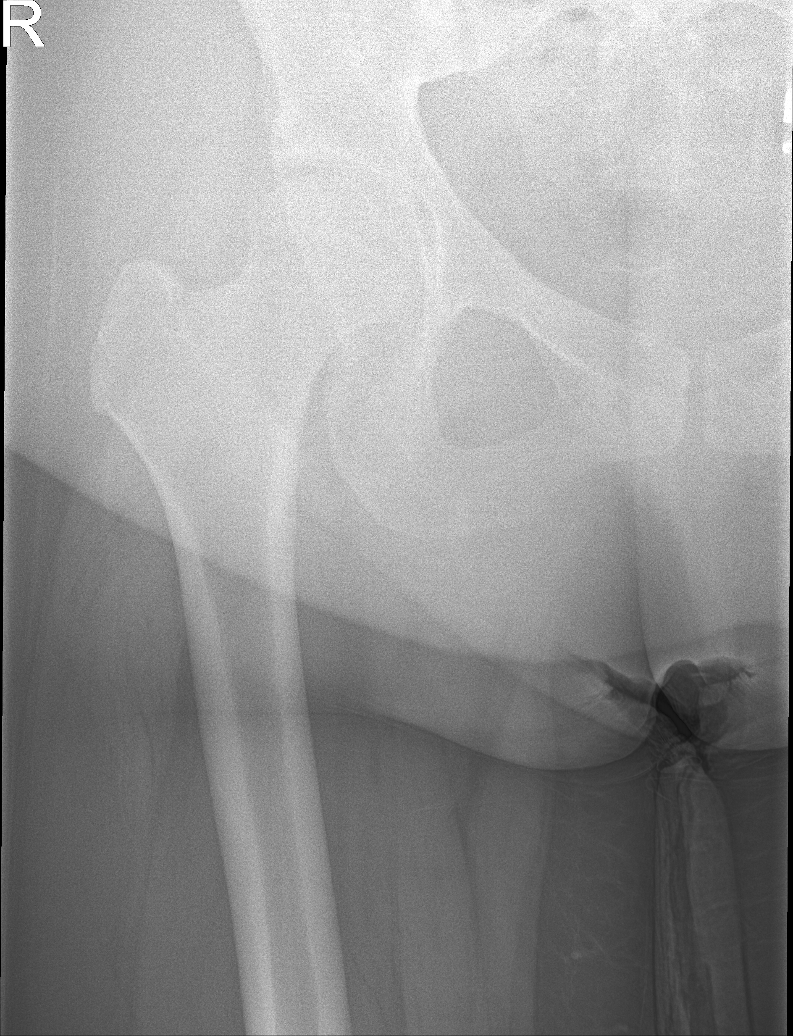

[5 of 5 positions shown; findings below may reference images not displayed]

FINDINGS: Image quality is degraded by patient body habitus. No fracture or
dislocation is identified. Right hip joint space width is preserved
without significant arthropathic changes identified. Limited
evaluation of the left hip is unremarkable.
IMPRESSION: Negative.

## 2019-05-06 ENCOUNTER — Other Ambulatory Visit: Payer: Self-pay

## 2019-05-06 ENCOUNTER — Ambulatory Visit (INDEPENDENT_AMBULATORY_CARE_PROVIDER_SITE_OTHER): Payer: Managed Care, Other (non HMO) | Admitting: Physician Assistant

## 2019-05-06 ENCOUNTER — Encounter: Payer: Self-pay | Admitting: Physician Assistant

## 2019-05-06 DIAGNOSIS — R739 Hyperglycemia, unspecified: Secondary | ICD-10-CM | POA: Diagnosis not present

## 2019-05-06 DIAGNOSIS — R030 Elevated blood-pressure reading, without diagnosis of hypertension: Secondary | ICD-10-CM

## 2019-05-06 DIAGNOSIS — F411 Generalized anxiety disorder: Secondary | ICD-10-CM | POA: Diagnosis not present

## 2019-05-06 MED ORDER — BUPROPION HCL ER (XL) 150 MG PO TB24: 150.0000 mg | ORAL_TABLET | Freq: Every day | ORAL | 5 refills | Status: DC

## 2019-05-08 ENCOUNTER — Encounter: Payer: Self-pay | Admitting: Physician Assistant

## 2019-05-08 NOTE — Progress Notes (Signed)
Acute Office Visit  Subjective:    Patient ID: Angela Johnson, female    DOB: 1985-07-01, 34 y.o.   MRN: [id number]  Chief Complaint  Patient presents with  . Medical Management of Chronic Issues    discuss meds.     Anxiety Presents for follow-up visit. Symptoms include decreased concentration, excessive worry, insomnia, irritability, nervous/anxious behavior and panic. Symptoms occur most days. The severity of symptoms is moderate. The quality of sleep is fair.     Patient is also here for follow-up on weight loss efforts.  Over the past month she is down about 8 pounds, she states maybe 14 at home.  She states that overall she is feeling good with her dietary efforts and with the medication.  She definitely can tell that Topamax has caused her to have no taste for carbonated drinks.  She is very happy about her ability to stop drinking any excess drinks.  She is working very hard on the keto diet.  And is trying to be more active in a daily exercise routine.  She does need refills on medication.   Past Medical History:  Diagnosis Date  . Arthritis   . Gonorrhea 2007   Treated  . Herniated disc   . Hypothyroidism   . Irritable bowel syndrome (IBS)     Past Surgical History:  Procedure Laterality Date  . oral     wisdom teeth    Family History  Problem Relation Age of Onset  . Hypertension Mother   . Hyperlipidemia Mother   . Hyperlipidemia Father   . Thyroid disease Father     Social History   Socioeconomic History  . Marital status: Married    Spouse name: Not on file  . Number of children: Not on file  . Years of education: Not on file  . Highest education level: Not on file  Occupational History  . Not on file  Tobacco Use  . Smoking status: Former Smoker    Quit date: 03/31/2005    Years since quitting: 14.1  . Smokeless tobacco: Never Used  Substance and Sexual Activity  . Alcohol use: No  . Drug use: No  . Sexual activity: Yes  Other Topics  Concern  . Not on file  Social History Narrative  . Not on file   Social Determinants of Health   Financial Resource Strain:   . Difficulty of Paying Living Expenses: Not on file  Food Insecurity:   . Worried About Charity fundraiser in the Last Year: Not on file  . Ran Out of Food in the Last Year: Not on file  Transportation Needs:   . Lack of Transportation (Medical): Not on file  . Lack of Transportation (Non-Medical): Not on file  Physical Activity:   . Days of Exercise per Week: Not on file  . Minutes of Exercise per Session: Not on file  Stress:   . Feeling of Stress : Not on file  Social Connections:   . Frequency of Communication with Friends and Family: Not on file  . Frequency of Social Gatherings with Friends and Family: Not on file  . Attends Religious Services: Not on file  . Active Member of Clubs or Organizations: Not on file  . Attends Archivist Meetings: Not on file  . Marital Status: Not on file  Intimate Partner Violence:   . Fear of Current or Ex-Partner: Not on file  . Emotionally Abused: Not on file  . Physically  Abused: Not on file  . Sexually Abused: Not on file    Outpatient Medications Prior to Visit  Medication Sig Dispense Refill  . ALPRAZolam (XANAX) 0.5 MG tablet Take 1 tablet (0.5 mg total) by mouth 2 (two) times daily as needed for anxiety. 20 tablet 0  . cetirizine (ZYRTEC) 10 MG tablet Take 10 mg by mouth daily. allergies     . citalopram (CELEXA) 40 MG tablet Take 1 tablet (40 mg total) by mouth daily. 90 tablet 3  . fluticasone (FLONASE) 50 MCG/ACT nasal spray Place 2 sprays into both nostrils daily. (Patient taking differently: Place 2 sprays into both nostrils as needed. ) 16 g 6  . levothyroxine (SYNTHROID) 50 MCG tablet Take 1 tablet (50 mcg total) by mouth daily. 90 tablet 0  . metFORMIN (GLUCOPHAGE) 500 MG tablet Take 1 tablet (500 mg total) by mouth 2 (two) times daily with a meal. 180 tablet 3  . norgestrel-ethinyl  estradiol (LO/OVRAL,CRYSELLE) 0.3-30 MG-MCG tablet Take 1 tablet by mouth daily.    [name] topiramate (TOPAMAX) 50 MG tablet Take 1 tablet (50 mg total) by mouth 2 (two) times daily. 60 tablet 2   No facility-administered medications prior to visit.    Allergies  Allergen Reactions  . Ciprofloxacin Hcl Itching  . Latex   . Zithromax [Azithromycin]     dizziness    Review of Systems  Constitutional: Positive for irritability.  HENT: Negative.   Eyes: Negative.   Respiratory: Negative.   Gastrointestinal: Negative.   Genitourinary: Negative.   Psychiatric/Behavioral: Positive for decreased concentration. The patient is nervous/anxious and has insomnia.        Objective:    Physical Exam Constitutional:      General: She is not in acute distress.    Appearance: Normal appearance. She is well-developed.  HENT:     Head: Normocephalic and atraumatic.  Cardiovascular:     Rate and Rhythm: Normal rate.  Pulmonary:     Effort: Pulmonary effort is normal.  Skin:    General: Skin is warm and dry.     Findings: No rash.  Neurological:     Mental Status: She is alert and oriented to person, place, and time.     Deep Tendon Reflexes: Reflexes are normal and symmetric.     BP (!) 141/84   Pulse 67   Temp 98.4 F (36.9 C)   Ht 5\' 8"  (1.727 m)   Wt (!) 353 lb (160.1 kg)   LMP 04/12/2019 (Approximate)   SpO2 99%   BMI 53.67 kg/m  Wt Readings from Last 3 Encounters:  05/06/19 (!) 353 lb (160.1 kg)  03/14/19 (!) 361 lb 3.2 oz (163.8 kg)  02/22/18 (!) 342 lb (155.1 kg)    There are no preventive care reminders to display for this patient.  There are no preventive care reminders to display for this patient.   Lab Results  Component Value Date   TSH 6.260 (H) 03/14/2019   Lab Results  Component Value Date   WBC 7.6 03/14/2019   HGB 12.1 03/14/2019   HCT 36.1 03/14/2019   MCV 90 03/14/2019   PLT 214 03/14/2019   Lab Results  Component Value Date   NA 143  03/14/2019   K 4.4 03/14/2019   CO2 22 03/14/2019   GLUCOSE 94 03/14/2019   BUN 16 03/14/2019   CREATININE 0.71 03/14/2019   BILITOT 0.2 03/14/2019   ALKPHOS 63 03/14/2019   AST 18 03/14/2019   ALT 21 03/14/2019  PROT 6.7 03/14/2019   ALBUMIN 4.2 03/14/2019   CALCIUM 9.0 03/14/2019   Lab Results  Component Value Date   CHOL 205 (H) 03/14/2019   Lab Results  Component Value Date   HDL 37 (L) 03/14/2019   Lab Results  Component Value Date   LDLCALC 127 (H) 03/14/2019   Lab Results  Component Value Date   TRIG 230 (H) 03/14/2019   Lab Results  Component Value Date   CHOLHDL 5.5 (H) 03/14/2019   Lab Results  Component Value Date   HGBA1C 5.3 03/14/2019       Assessment & Plan:   Problem List Items Addressed This Visit      Other   Anxiety state   Relevant Medications   buPROPion (WELLBUTRIN XL) 150 MG 24 hr tablet   Elevated blood pressure reading   Morbid obesity (HCC) - Primary   Relevant Medications   buPROPion (WELLBUTRIN XL) 150 MG 24 hr tablet   Elevated blood sugar       Meds ordered this encounter  Medications  . buPROPion (WELLBUTRIN XL) 150 MG 24 hr tablet    Sig: Take 1 tablet (150 mg total) by mouth daily.    Dispense:  30 tablet    Refill:  5    Order Specific Question:   Supervising Provider    Answer:   Raliegh Ip [[id number]]     Remus Loffler, PA-C

## 2019-05-09 ENCOUNTER — Encounter: Payer: Self-pay | Admitting: Physician Assistant

## 2019-05-09 DIAGNOSIS — F411 Generalized anxiety disorder: Secondary | ICD-10-CM

## 2019-05-09 MED ORDER — CITALOPRAM HYDROBROMIDE 40 MG PO TABS: 40.0000 mg | ORAL_TABLET | Freq: Every day | ORAL | 0 refills | Status: DC

## 2019-05-29 ENCOUNTER — Other Ambulatory Visit: Payer: Self-pay | Admitting: Physician Assistant

## 2019-05-29 DIAGNOSIS — F411 Generalized anxiety disorder: Secondary | ICD-10-CM

## 2019-06-03 ENCOUNTER — Ambulatory Visit: Payer: Managed Care, Other (non HMO) | Admitting: Physician Assistant

## 2019-06-07 ENCOUNTER — Other Ambulatory Visit: Payer: Self-pay | Admitting: Physician Assistant

## 2019-06-07 NOTE — Telephone Encounter (Signed)
Ov 07/04/19 

## 2019-07-04 ENCOUNTER — Ambulatory Visit: Payer: Managed Care, Other (non HMO) | Admitting: Physician Assistant

## 2019-07-26 ENCOUNTER — Ambulatory Visit: Payer: Managed Care, Other (non HMO) | Admitting: Family Medicine

## 2019-07-26 NOTE — Progress Notes (Deleted)
Subjective: CC: obesity, est care PCP: Remus Loffler, PA-C Angela Johnson is a 34 y.o. female presenting to clinic today for:  1. ***   ROS: Per HPI  Allergies  Allergen Reactions  . Ciprofloxacin Hcl Itching  . Latex   . Zithromax [Azithromycin]     dizziness   Past Medical History:  Diagnosis Date  . Arthritis   . Gonorrhea 2007   Treated  . Herniated disc   . Hypothyroidism   . Irritable bowel syndrome (IBS)     Current Outpatient Medications:  .  ALPRAZolam (XANAX) 0.5 MG tablet, Take 1 tablet (0.5 mg total) by mouth 2 (two) times daily as needed for anxiety., Disp: 20 tablet, Rfl: 0 .  buPROPion (WELLBUTRIN XL) 150 MG 24 hr tablet, TAKE 1 TABLET BY MOUTH EVERY DAY, Disp: 90 tablet, Rfl: 2 .  cetirizine (ZYRTEC) 10 MG tablet, Take 10 mg by mouth daily. allergies , Disp: , Rfl:  .  citalopram (CELEXA) 40 MG tablet, Take 1 tablet (40 mg total) by mouth daily., Disp: 90 tablet, Rfl: 0 .  fluticasone (FLONASE) 50 MCG/ACT nasal spray, Place 2 sprays into both nostrils daily. (Patient taking differently: Place 2 sprays into both nostrils as needed. ), Disp: 16 g, Rfl: 6 .  levothyroxine (SYNTHROID) 50 MCG tablet, TAKE 1 TABLET BY MOUTH EVERY DAY, Disp: 90 tablet, Rfl: 0 .  metFORMIN (GLUCOPHAGE) 500 MG tablet, Take 1 tablet (500 mg total) by mouth 2 (two) times daily with a meal., Disp: 180 tablet, Rfl: 3 .  norgestrel-ethinyl estradiol (LO/OVRAL,CRYSELLE) 0.3-30 MG-MCG tablet, Take 1 tablet by mouth daily., Disp: , Rfl:  .  topiramate (TOPAMAX) 50 MG tablet, TAKE 1 TABLET BY MOUTH TWICE A DAY, Disp: 180 tablet, Rfl: 0 Social History   Socioeconomic History  . Marital status: Married    Spouse name: Not on file  . Number of children: Not on file  . Years of education: Not on file  . Highest education level: Not on file  Occupational History  . Not on file  Tobacco Use  . Smoking status: Former Smoker    Quit date: 03/31/2005    Years since quitting: 14.3  .  Smokeless tobacco: Never Used  Substance and Sexual Activity  . Alcohol use: No  . Drug use: No  . Sexual activity: Yes  Other Topics Concern  . Not on file  Social History Narrative  . Not on file   Social Determinants of Health   Financial Resource Strain:   . Difficulty of Paying Living Expenses:   Food Insecurity:   . Worried About Programme researcher, broadcasting/film/video in the Last Year:   . Barista in the Last Year:   Transportation Needs:   . Freight forwarder (Medical):   [name] Lack of Transportation (Non-Medical):   Physical Activity:   . Days of Exercise per Week:   . Minutes of Exercise per Session:   Stress:   . Feeling of Stress :   Social Connections:   . Frequency of Communication with Friends and Family:   . Frequency of Social Gatherings with Friends and Family:   . Attends Religious Services:   . Active Member of Clubs or Organizations:   . Attends Banker Meetings:   [name] Marital Status:   Intimate Partner Violence:   . Fear of Current or Ex-Partner:   . Emotionally Abused:   [name] Physically Abused:   . Sexually Abused:  Family History  Problem Relation Age of Onset  . Hypertension Mother   . Hyperlipidemia Mother   . Hyperlipidemia Father   . Thyroid disease Father     Objective: Office vital signs reviewed. There were no vitals taken for this visit.  Physical Examination:  General: Awake, alert, *** nourished, No acute distress HEENT: Normal    Neck: No masses palpated. No lymphadenopathy    Ears: Tympanic membranes intact, normal light reflex, no erythema, no bulging    Eyes: PERRLA, extraocular membranes intact, sclera ***    Nose: nasal turbinates moist, *** nasal discharge    Throat: moist mucus membranes, no erythema, *** tonsillar exudate.  Airway is patent Cardio: regular rate and rhythm, S1S2 heard, no murmurs appreciated Pulm: clear to auscultation bilaterally, no wheezes, rhonchi or rales; normal work of breathing on room  air GI: soft, non-tender, non-distended, bowel sounds present x4, no hepatomegaly, no splenomegaly, no masses GU: external vaginal tissue ***, cervix ***, *** punctate lesions on cervix appreciated, *** discharge from cervical os, *** bleeding, *** cervical motion tenderness, *** abdominal/ adnexal masses Extremities: warm, well perfused, No edema, cyanosis or clubbing; +*** pulses bilaterally MSK: *** gait and *** station Skin: dry; intact; no rashes or lesions Neuro: *** Strength and light touch sensation grossly intact, *** DTRs ***/4  Assessment/ Plan: 34 y.o. female   ***  No orders of the defined types were placed in this encounter.  No orders of the defined types were placed in this encounter.    Angela Norlander, DO [place] [phone]

## 2019-08-03 ENCOUNTER — Other Ambulatory Visit: Payer: Self-pay | Admitting: *Deleted

## 2019-08-03 DIAGNOSIS — F411 Generalized anxiety disorder: Secondary | ICD-10-CM

## 2019-08-03 MED ORDER — CITALOPRAM HYDROBROMIDE 40 MG PO TABS: 40.0000 mg | ORAL_TABLET | Freq: Every day | ORAL | 0 refills | Status: DC

## 2019-08-12 ENCOUNTER — Ambulatory Visit: Payer: Managed Care, Other (non HMO) | Admitting: Physician Assistant

## 2019-08-24 ENCOUNTER — Other Ambulatory Visit: Payer: Self-pay | Admitting: Family

## 2019-08-24 DIAGNOSIS — F411 Generalized anxiety disorder: Secondary | ICD-10-CM

## 2019-08-25 NOTE — Telephone Encounter (Signed)
Former [place] NT[employer] 30 days given 08/03/19 

## 2019-10-10 ENCOUNTER — Other Ambulatory Visit: Payer: Self-pay | Admitting: Family

## 2019-10-10 DIAGNOSIS — F411 Generalized anxiety disorder: Secondary | ICD-10-CM

## 2019-10-11 ENCOUNTER — Other Ambulatory Visit: Payer: Self-pay

## 2019-10-11 DIAGNOSIS — F411 Generalized anxiety disorder: Secondary | ICD-10-CM

## 2019-10-11 MED ORDER — CITALOPRAM HYDROBROMIDE 40 MG PO TABS: 40.0000 mg | ORAL_TABLET | Freq: Every day | ORAL | 0 refills | Status: DC

## 2019-10-11 NOTE — Telephone Encounter (Signed)
Pt message sent - aware NTBS

## 2019-10-11 NOTE — Telephone Encounter (Signed)
[employer] [place] NT[employer] 30 days given 08/03/19

## 2019-10-27 ENCOUNTER — Other Ambulatory Visit: Payer: Self-pay | Admitting: Family Medicine

## 2019-10-27 DIAGNOSIS — F411 Generalized anxiety disorder: Secondary | ICD-10-CM

## 2019-10-28 ENCOUNTER — Encounter: Payer: Self-pay | Admitting: Family Medicine

## 2019-10-28 ENCOUNTER — Other Ambulatory Visit: Payer: Self-pay | Admitting: *Deleted

## 2019-10-28 DIAGNOSIS — R739 Hyperglycemia, unspecified: Secondary | ICD-10-CM

## 2019-10-28 MED ORDER — METFORMIN HCL 500 MG PO TABS: 500.0000 mg | ORAL_TABLET | Freq: Two times a day (BID) | ORAL | 0 refills | Status: DC

## 2019-10-28 MED ORDER — CITALOPRAM HYDROBROMIDE 40 MG PO TABS: 40.0000 mg | ORAL_TABLET | Freq: Every day | ORAL | 0 refills | Status: DC

## 2019-11-02 ENCOUNTER — Other Ambulatory Visit: Payer: Self-pay | Admitting: Family Medicine

## 2019-11-02 DIAGNOSIS — F411 Generalized anxiety disorder: Secondary | ICD-10-CM

## 2019-11-02 NOTE — Telephone Encounter (Signed)
Ov 11/15/19

## 2019-11-15 ENCOUNTER — Encounter: Payer: Self-pay | Admitting: Family Medicine

## 2019-11-15 ENCOUNTER — Other Ambulatory Visit: Payer: Self-pay

## 2019-11-15 ENCOUNTER — Ambulatory Visit (INDEPENDENT_AMBULATORY_CARE_PROVIDER_SITE_OTHER): Payer: BC Managed Care – PPO | Admitting: Family Medicine

## 2019-11-15 VITALS — BP 136/80 | HR 93 | Temp 97.7°F | Ht 68.0 in | Wt 357.6 lb

## 2019-11-15 DIAGNOSIS — R7989 Other specified abnormal findings of blood chemistry: Secondary | ICD-10-CM

## 2019-11-15 DIAGNOSIS — Z7689 Persons encountering health services in other specified circumstances: Secondary | ICD-10-CM

## 2019-11-15 DIAGNOSIS — Z79899 Other long term (current) drug therapy: Secondary | ICD-10-CM | POA: Diagnosis not present

## 2019-11-15 DIAGNOSIS — G4719 Other hypersomnia: Secondary | ICD-10-CM

## 2019-11-15 DIAGNOSIS — E8881 Metabolic syndrome: Secondary | ICD-10-CM

## 2019-11-15 DIAGNOSIS — F411 Generalized anxiety disorder: Secondary | ICD-10-CM | POA: Diagnosis not present

## 2019-11-15 DIAGNOSIS — F410 Panic disorder [episodic paroxysmal anxiety] without agoraphobia: Secondary | ICD-10-CM

## 2019-11-15 DIAGNOSIS — G479 Sleep disorder, unspecified: Secondary | ICD-10-CM

## 2019-11-15 LAB — BAYER DCA HB A1C WAIVED: HB A1C (BAYER DCA - WAIVED): 5.2 % (ref <7.0)

## 2019-11-15 MED ORDER — CITALOPRAM HYDROBROMIDE 40 MG PO TABS: 40.0000 mg | ORAL_TABLET | Freq: Every day | ORAL | 3 refills | Status: DC

## 2019-11-15 MED ORDER — LORAZEPAM 0.5 MG PO TABS: 0.2500 mg | ORAL_TABLET | Freq: Two times a day (BID) | ORAL | 0 refills | Status: DC | PRN

## 2019-11-15 NOTE — Progress Notes (Signed)
Subjective: CC: Establish care, generalized anxiety disorder with panic, hypothyroidism PCP: Janora Norlander, DO PVV:ZSMOL L Fromme is a 34 y.o. female presenting to clinic today for:  1.  Generalized anxiety disorder with panic Patient with longstanding history of generalized anxiety disorder with panic.  She take Celexa 40 mg daily.  Unfortunately she had a 4-day labs recently and she had quite a bit of exacerbation in her symptoms.  She subsequently took one of her mother's lorazepam to help with the anxiety.  This did calm her nerves.  She rarely takes the alprazolam that she was prescribed previously but is out of this medicine.  Last prescription was in December of last year for #20.  She denies excessive to sedation, memory loss, falls, respiratory depression.  2.  Hypothyroidism, acquired? Family history is significant for thyroid disorder.  She has no history of radiation or surgery to the neck.  Denies any tremor, heart palpitation, change in voice or difficulty swallowing.  She does admit that she does not take the medication religiously and has been out of the medicine for several days now.  She has difficulty with weight loss, see below  3.  Morbid obesity Patient is trialed Wellbutrin in efforts to lose weight but this was discontinued for unknown reasons.  She does not quite recall that it made much of a difference.  She is also been on Topamax, again to trying change her taste buds so that she would not drink too much soda.  She does admit soda is her vice and she frequently drinks this.  She admits to gasping for breath during sleep.  She has never had a sleep study done but would like to proceed with this as she often feels excessive daytime fatigue.  She also has chronic left-sided hip pain that has been worsening over the last couple of years.  She has an appointment coming up with orthopedics for further evaluation and management   ROS: Per HPI  Allergies  Allergen  Reactions  . Ciprofloxacin Hcl Itching  . Latex   . Zithromax [Azithromycin]     dizziness   Past Medical History:  Diagnosis Date  . Arthritis   . Gonorrhea 2007   Treated  . Herniated disc   . Hypothyroidism   . Irritable bowel syndrome (IBS)     Current Outpatient Medications:  .  ALPRAZolam (XANAX) 0.5 MG tablet, Take 1 tablet (0.5 mg total) by mouth 2 (two) times daily as needed for anxiety., Disp: 20 tablet, Rfl: 0 .  cetirizine (ZYRTEC) 10 MG tablet, Take 10 mg by mouth daily. allergies , Disp: , Rfl:  .  citalopram (CELEXA) 40 MG tablet, Take 1 tablet (40 mg total) by mouth daily., Disp: 90 tablet, Rfl: 0 .  fluticasone (FLONASE) 50 MCG/ACT nasal spray, Place 2 sprays into both nostrils daily. (Patient taking differently: Place 2 sprays into both nostrils as needed. ), Disp: 16 g, Rfl: 6 .  levothyroxine (SYNTHROID) 50 MCG tablet, TAKE 1 TABLET BY MOUTH EVERY DAY, Disp: 90 tablet, Rfl: 0 .  metFORMIN (GLUCOPHAGE) 500 MG tablet, Take 1 tablet (500 mg total) by mouth 2 (two) times daily with a meal., Disp: 180 tablet, Rfl: 0 .  norgestrel-ethinyl estradiol (LO/OVRAL,CRYSELLE) 0.3-30 MG-MCG tablet, Take 1 tablet by mouth daily., Disp: , Rfl:  Social History   Socioeconomic History  . Marital status: Married    Spouse name: Not on file  . Number of children: Not on file  . Years of  education: Not on file  . Highest education level: Not on file  Occupational History  . Not on file  Tobacco Use  . Smoking status: Former Smoker    Quit date: 03/31/2005    Years since quitting: 14.6  . Smokeless tobacco: Never Used  Vaping Use  . Vaping Use: Never used  Substance and Sexual Activity  . Alcohol use: No  . Drug use: No  . Sexual activity: Yes  Other Topics Concern  . Not on file  Social History Narrative  . Not on file   Social Determinants of Health   Financial Resource Strain:   . Difficulty of Paying Living Expenses:   Food Insecurity:   . Worried About Paediatric nurse in the Last Year:   . Arboriculturist in the Last Year:   Transportation Needs:   . Film/video editor (Medical):   [name] Lack of Transportation (Non-Medical):   Physical Activity:   . Days of Exercise per Week:   . Minutes of Exercise per Session:   Stress:   . Feeling of Stress :   Social Connections:   . Frequency of Communication with Friends and Family:   . Frequency of Social Gatherings with Friends and Family:   . Attends Religious Services:   . Active Member of Clubs or Organizations:   . Attends Archivist Meetings:   [name] Marital Status:   Intimate Partner Violence:   . Fear of Current or Ex-Partner:   . Emotionally Abused:   [name] Physically Abused:   . Sexually Abused:    Family History  Problem Relation Age of Onset  . Hypertension Mother   . Hyperlipidemia Mother   . Hyperlipidemia Father   . Thyroid disease Father     Objective: Office vital signs reviewed. BP 136/80   Pulse 93   Temp 97.7 F (36.5 C)   Ht _0  (1.727 m)   Wt (!) 357 lb 9.6 oz (162.2 kg)   SpO2 97%   BMI 54.37 kg/m   Physical Examination:  General: Awake, alert, morbidly obese, No acute distress HEENT: Normal    Neck: No masses palpated. No lymphadenopathy; no thyromegaly, goiter.    Eyes: PERRLA, extraocular membranes intact, sclera white, no exophthalmos Cardio: regular rate and rhythm, S1S2 heard, no murmurs appreciated Pulm: clear to auscultation bilaterally, no wheezes, rhonchi or rales; normal work of breathing on room air Skin: dry; intact; no rashes or lesions; normal temperature Neuro: No tremor Psych: Mood stable, speech normal, affect appropriate, pleasant and interactive Depression screen [facility] 2/9 11/15/2019 05/06/2019 03/14/2019  Decreased Interest 0 0 0  Down, Depressed, Hopeless 0 0 0  PHQ - 2 Score 0 0 0   No flowsheet data found.  Assessment/ Plan: 34 y.o. female   1. Generalized anxiety disorder with panic attacks Controlled substance  contract completed, UDS obtained.  Since she responded well to lorazepam, I did switch her medication from Xanax to lorazepam.  We discussed that long-term use of benzodiazepines have risks and I would like to avoid ongoing use of this medicine.  For now, I would like to try and get her thyroid under control, get her evaluated for sleep apnea.  We will revisit anxiety at her next visit.  We have discussed the buspirone and I given her information about this.  I think this would be a good addition for her given her reports of anxiety despite use of SSRI - ToxASSURE Select 13 (MW), Urine -  LORazepam (ATIVAN) 0.5 MG tablet; Take 0.5-1 tablets (0.25-0.5 mg total) by mouth 2 (two) times daily as needed for anxiety.  Dispense: 30 tablet; Refill: 0 - citalopram (CELEXA) 40 MG tablet; Take 1 tablet (40 mg total) by mouth daily.  Dispense: 90 tablet; Refill: 3  2. Controlled substance agreement signed - ToxASSURE Select 13 (MW), Urine  3. Morbid obesity ([place]) We will plan to follow-up and discuss further weight loss options.  Thus far it sounds like she has tried and failed Wellbutrin and Topamax.  I have given her information about Saxenda and would go V.  I think that these might be good options for her, particularly in the setting of metabolic syndrome.  However, I do think that controlling her thyroid levels as well as evaluating for sleep apnea will be imperative prior to starting any weight loss medications. - LDL Cholesterol, Direct - CMP14+EGFR - Bayer DCA Hb A1c Waived - Ambulatory referral to Neurology  4. Elevated TSH Reiterated that she should keep the medicine by her toothbrush so that she can remember to take this on an empty stomach daily.  I will check her for auto antibodies given family history of thyroid disorder - Thyroid Panel With TSH - Thyroglobulin antibody - Thyroid Peroxidase Antibody - Thyrotropin receptor autoabs  5. Establishing care with new doctor, encounter for  6.  Metabolic syndrome - Ambulatory referral to Neurology  7. Disordered sleep - Ambulatory referral to Neurology  8. Excessive daytime sleepiness - Ambulatory referral to Neurology   Orders Placed This Encounter  Procedures  . Thyroid Panel With TSH  . LDL Cholesterol, Direct  . CMP14+EGFR  . Bayer DCA Hb A1c Waived   No orders of the defined types were placed in this encounter.  The Narcotic Database has been reviewed.  There were no red flags.    Janora Norlander, DO [place] [phone]

## 2019-11-15 NOTE — Patient Instructions (Signed)
You had labs performed today.  You will be contacted with the results of the labs once they are available, usually in the next 3 business days for routine lab work.  If you have an active my chart account, they will be released to your MyChart.  If you prefer to have these labs released to you via telephone, please let us know.  If you had a pap smear or biopsy performed, expect to be contacted in about 7-10 days.  Saxenda/ MHDQQI: weight loss Buspar: Anxiety (in addition to Celexa).  Benzodiazepines (lorazepam/ valium/ xanax/ klonopin) are associated with alzheimer's and death.  Buspar would be lower risk long term if you end up needing this after thyroid and sleep are controlled.  Sleep evaluation ordered. Take Synthroid every day!  Recheck in 3 months.   Controlled Substance Guidelines:  1. You cannot get an early refill, even it is lost.  2. You cannot get controlled medications from any other doctor, unless it is the emergency department and related to a new problem or injury.  3. You cannot use alcohol, marijuana, cocaine or any other recreational drugs while using this medication. This is very dangerous.  4. You are willing to have your urine drug tested at each visit.  5. You will not drive while using this medication, because that can put yourself and others in serious danger of an accident. 6. If any medication is stolen, then there must be a police report to verify it, or it cannot be refilled.  7. I will not prescribe these medications for longer than 3 months.  8. You must bring your pill bottle to each visit.  9. You must use the same pharmacy for all refills for the medication, unless you clear it with me beforehand.  10. You cannot share or sell this medication.   In preparation for our upcoming appointment regarding weight loss, I would like you to answer the following questions on a separate piece of paper and bring them with you to your appointment.   Why do you want to  lose weight?  Does obesity run in your family?  What has been your highest weight/ lowest weight in the past?  What did you weigh when you were 34 years old?  What is your goal weight?  What diets/ medications have you tried and did they work?  What side effects did they have if any?  What are your "eating/ binging" triggers?  Have you ever seen a therapist regarding your eating habits?  Are you willing to?  Would you be willing to see a dietician?  Are you willing to meet with me monthly?  Do you have any personal history of thyroid disease (including thyroid cancer), pancreatitis, diabetes, high blood pressure, high cholesterol, heart attack, stroke?  Do you have any family history of thyroid disease (including thyroid cancer), pancreatitis, diabetes, high blood pressure, high cholesterol, heart attack, stroke?  If so, please list who.  Have you ever been diagnosed or had a mental health disorder (addiction, depression, anxiety, bipolar disorder, schizophrenia, etc)?  Is there family history of mental health disorders (addiction, depression, anxiety, bipolar disorder, schizophrenia, etc)?  Would you be interested in bariatric surgery if you were determined to be a candidate?  Please also bring a 3 day food journal to that appointment.  Document EVERYTHING (including snacks/ candies/ food tastings/ drinks), what time of day you ate them and what you were doing and feeling when you ate/ drink (were you driving/ rushing  to get somewhere?  Were you seated at the dinner table/ watching tv?  Were you lonely/ upset/ happy/ celebrating?)

## 2019-11-16 LAB — CMP14+EGFR
ALT: 21 IU/L (ref 0–32)
AST: 20 IU/L (ref 0–40)
Albumin/Globulin Ratio: 1.5 (ref 1.2–2.2)
Albumin: 4.5 g/dL (ref 3.8–4.8)
Alkaline Phosphatase: 92 IU/L (ref 48–121)
BUN/Creatinine Ratio: 16 (ref 9–23)
BUN: 12 mg/dL (ref 6–20)
Bilirubin Total: 0.4 mg/dL (ref 0.0–1.2)
CO2: 22 mmol/L (ref 20–29)
Calcium: 9.6 mg/dL (ref 8.7–10.2)
Chloride: 101 mmol/L (ref 96–106)
Creatinine, Ser: 0.74 mg/dL (ref 0.57–1.00)
GFR calc Af Amer: 122 mL/min/{1.73_m2} (ref >59)
GFR calc non Af Amer: 106 mL/min/{1.73_m2} (ref >59)
Globulin, Total: 3.1 g/dL (ref 1.5–4.5)
Glucose: 95 mg/dL (ref 65–99)
Potassium: 4.6 mmol/L (ref 3.5–5.2)
Sodium: 139 mmol/L (ref 134–144)
Total Protein: 7.6 g/dL (ref 6.0–8.5)

## 2019-11-16 LAB — THYROTROPIN RECEPTOR AUTOABS: Thyrotropin Receptor Ab: <1.1 IU/L (ref 0.00–1.75)

## 2019-11-16 LAB — THYROID PEROXIDASE ANTIBODY: Thyroperoxidase Ab SerPl-aCnc: 323 IU/mL — ABNORMAL HIGH (ref 0–34)

## 2019-11-16 LAB — THYROGLOBULIN ANTIBODY: Thyroglobulin Antibody: <1 IU/mL (ref 0.0–0.9)

## 2019-11-16 LAB — THYROID PANEL WITH TSH
Free Thyroxine Index: 5.6 — ABNORMAL HIGH (ref 1.2–4.9)
T3 Uptake Ratio: 55 % — ABNORMAL HIGH (ref 24–39)
T4, Total: 10.1 ug/dL (ref 4.5–12.0)
TSH: 0.314 u[IU]/mL — ABNORMAL LOW (ref 0.450–4.500)

## 2019-11-16 LAB — LDL CHOLESTEROL, DIRECT: LDL Direct: 112 mg/dL — ABNORMAL HIGH (ref 0–99)

## 2019-11-17 ENCOUNTER — Encounter: Payer: Self-pay | Admitting: Family Medicine

## 2019-11-17 DIAGNOSIS — E063 Autoimmune thyroiditis: Secondary | ICD-10-CM | POA: Insufficient documentation

## 2019-11-18 LAB — TOXASSURE SELECT 13 (MW), URINE

## 2019-12-08 DIAGNOSIS — M25552 Pain in left hip: Secondary | ICD-10-CM | POA: Diagnosis not present

## 2020-01-24 ENCOUNTER — Other Ambulatory Visit: Payer: Self-pay | Admitting: Family Medicine

## 2020-01-24 DIAGNOSIS — F410 Panic disorder [episodic paroxysmal anxiety] without agoraphobia: Secondary | ICD-10-CM

## 2020-01-24 DIAGNOSIS — F411 Generalized anxiety disorder: Secondary | ICD-10-CM

## 2020-04-26 ENCOUNTER — Other Ambulatory Visit: Payer: Self-pay | Admitting: *Deleted

## 2020-04-26 DIAGNOSIS — R739 Hyperglycemia, unspecified: Secondary | ICD-10-CM

## 2020-08-09 DIAGNOSIS — Z130 Encounter for screening for diseases of the blood and blood-forming organs and certain disorders involving the immune mechanism: Secondary | ICD-10-CM | POA: Diagnosis not present

## 2020-08-09 DIAGNOSIS — Z1329 Encounter for screening for other suspected endocrine disorder: Secondary | ICD-10-CM | POA: Diagnosis not present

## 2020-08-09 DIAGNOSIS — Z13220 Encounter for screening for lipoid disorders: Secondary | ICD-10-CM | POA: Diagnosis not present

## 2020-08-09 DIAGNOSIS — R309 Painful micturition, unspecified: Secondary | ICD-10-CM | POA: Diagnosis not present

## 2020-08-09 DIAGNOSIS — Z01419 Encounter for gynecological examination (general) (routine) without abnormal findings: Secondary | ICD-10-CM | POA: Diagnosis not present

## 2020-08-21 ENCOUNTER — Encounter: Payer: Self-pay | Admitting: Family Medicine

## 2020-08-23 ENCOUNTER — Ambulatory Visit: Payer: Self-pay | Admitting: Family Medicine

## 2020-08-24 ENCOUNTER — Ambulatory Visit: Payer: BC Managed Care – PPO | Admitting: Family Medicine

## 2020-08-24 ENCOUNTER — Encounter: Payer: Self-pay | Admitting: Family Medicine

## 2020-09-18 ENCOUNTER — Ambulatory Visit (INDEPENDENT_AMBULATORY_CARE_PROVIDER_SITE_OTHER): Payer: BC Managed Care – PPO | Admitting: Family Medicine

## 2020-09-18 ENCOUNTER — Encounter: Payer: Self-pay | Admitting: Family Medicine

## 2020-09-18 ENCOUNTER — Other Ambulatory Visit: Payer: Self-pay

## 2020-09-18 VITALS — BP 125/80 | HR 74 | Temp 98.1°F | Ht 68.0 in | Wt 383.2 lb

## 2020-09-18 DIAGNOSIS — F411 Generalized anxiety disorder: Secondary | ICD-10-CM

## 2020-09-18 DIAGNOSIS — F410 Panic disorder [episodic paroxysmal anxiety] without agoraphobia: Secondary | ICD-10-CM

## 2020-09-18 DIAGNOSIS — E781 Pure hyperglyceridemia: Secondary | ICD-10-CM

## 2020-09-18 DIAGNOSIS — E063 Autoimmune thyroiditis: Secondary | ICD-10-CM

## 2020-09-18 DIAGNOSIS — F329 Major depressive disorder, single episode, unspecified: Secondary | ICD-10-CM | POA: Diagnosis not present

## 2020-09-18 DIAGNOSIS — F32A Depression, unspecified: Secondary | ICD-10-CM

## 2020-09-18 MED ORDER — CITALOPRAM HYDROBROMIDE 40 MG PO TABS: 40.0000 mg | ORAL_TABLET | Freq: Every day | ORAL | 3 refills | Status: DC

## 2020-09-18 MED ORDER — BUPROPION HCL ER (XL) 150 MG PO TB24: 150.0000 mg | ORAL_TABLET | Freq: Every day | ORAL | 1 refills | Status: DC

## 2020-09-18 NOTE — Progress Notes (Signed)
Subjective: CC: Follow-up hypothyroidism PCP: Janora Norlander, DO OQH:UTMLY [name] is a 35 y.o. female presenting to clinic today for:  1.  Hashimoto's thyroiditis Determined by positive thyroid peroxidase antibody in August 2021. She had thyroid levels obtained at her OB/GYN visit about a month ago.  Apparently her TSH level was elevated but she admits that she had not been compliant with her thyroid medications.  She did have her medications advanced to 75 mcg however and has been compliant with these for the last 4 to 6 weeks.  She denies any change in bowel habit, tremor, heart palpitations.  Anxiety is stable with as needed use of Ativan and consistent use of Celexa.  She does feel difficulty with focusing and wonders if she has ADHD.  Her son is treated for the same with Focalin.  She does admit to depressive symptoms and wants to know if there is something that can augment her medication to help with this.  Her symptoms are affecting both home life and work life at this point.  2.  Morbid obesity Previous medications tried include Topamax which caused mood instability.  She discontinued this.  She would be amenable to an injectable but thus far her insurance has not been very good about covering these medicines.  She admits to poor dietary choices and her triglycerides were persistently elevated on recent lab work with her OB/GYN.  LDL and HDL also abnormal.  Sugar however was noted to be normal per her report.  ROS: Per HPI  Allergies  Allergen Reactions   Ciprofloxacin Hcl Itching   Latex    Zithromax [Azithromycin]     dizziness   Past Medical History:  Diagnosis Date   Arthritis    Gonorrhea 2007   Treated   Herniated disc    Hypothyroidism    Irritable bowel syndrome (IBS)     Current Outpatient Medications:    cetirizine (ZYRTEC) 10 MG tablet, Take 10 mg by mouth daily. allergies , Disp: , Rfl:    citalopram (CELEXA) 40 MG tablet, Take 1 tablet (40 mg total)  by mouth daily., Disp: 90 tablet, Rfl: 3   fluticasone (FLONASE) 50 MCG/ACT nasal spray, Place 2 sprays into both nostrils daily. (Patient taking differently: Place 2 sprays into both nostrils as needed. ), Disp: 16 g, Rfl: 6   levothyroxine (SYNTHROID) 50 MCG tablet, TAKE 1 TABLET BY MOUTH EVERY DAY, Disp: 90 tablet, Rfl: 0   LORazepam (ATIVAN) 0.5 MG tablet, Take 0.5-1 tablets (0.25-0.5 mg total) by mouth 2 (two) times daily as needed for anxiety., Disp: 30 tablet, Rfl: 0   norgestrel-ethinyl estradiol (LO/OVRAL,CRYSELLE) 0.3-30 MG-MCG tablet, Take 1 tablet by mouth daily., Disp: , Rfl:  Social History   Socioeconomic History   Marital status: Married    Spouse name: Not on file   Number of children: Not on file   Years of education: Not on file   Highest education level: Not on file  Occupational History   Not on file  Tobacco Use   Smoking status: Former    Pack years: 0.00    Types: Cigarettes    Quit date: 03/31/2005    Years since quitting: 15.4   Smokeless tobacco: Never  Vaping Use   Vaping Use: Never used  Substance and Sexual Activity   Alcohol use: No   Drug use: No   Sexual activity: Yes  Other Topics Concern   Not on file  Social History Narrative   Not on file  Social Determinants of Health   Financial Resource Strain: Not on file  Food Insecurity: Not on file  Transportation Needs: Not on file  Physical Activity: Not on file  Stress: Not on file  Social Connections: Not on file  Intimate Partner Violence: Not on file   Family History  Problem Relation Age of Onset   Hypertension Mother    Hyperlipidemia Mother    Hyperlipidemia Father    Thyroid disease Father     Objective: Office vital signs reviewed. BP 125/80   Pulse 74   Temp 98.1 F (36.7 C)   Ht _0  (1.727 m)   Wt (!) 383 lb 3.2 oz (173.8 kg)   LMP 09/04/2020   SpO2 98%   BMI 58.27 kg/m   Physical Examination:  General: Awake, alert, morbidly obese, No acute distress HEENT:  Normal; no exophthalmos or goiter Cardio: regular rate and rhythm, S1S2 heard, no murmurs appreciated Pulm: clear to auscultation bilaterally, no wheezes, rhonchi or rales; normal work of breathing on room air Skin: dry; intact; no rashes or lesions; normal temperature Neuro: No tremor Psych: Mood stable, speech normal, affect appropriate.  Thought processes linear.  Good eye contact Depression screen [facility] 2/9 09/18/2020 11/15/2019 05/06/2019  Decreased Interest 1 0 0  Down, Depressed, Hopeless 1 0 0  PHQ - 2 Score 2 0 0  Altered sleeping 2 - -  Tired, decreased energy 2 - -  Change in appetite 1 - -  Feeling bad or failure about yourself  1 - -  Trouble concentrating 2 - -  Moving slowly or fidgety/restless 0 - -  Suicidal thoughts 0 - -  PHQ-9 Score 10 - -  Difficult doing work/chores Somewhat difficult - -   GAD 7 : Generalized Anxiety Score 09/18/2020  Nervous, Anxious, on Edge 1  Control/stop worrying 0  Worry too much - different things 0  Trouble relaxing 0  Restless 0  Easily annoyed or irritable 1  Afraid - awful might happen 0  Total GAD 7 Score 2  Anxiety Difficulty Not difficult at all    Assessment/ Plan: 35 y.o. female   Hashimoto's thyroiditis - Plan: CMP14+EGFR, TSH, T4, Free  Morbid obesity (HCC) - Plan: buPROPion (WELLBUTRIN XL) 150 MG 24 hr tablet  Depressive disorder - Plan: buPROPion (WELLBUTRIN XL) 150 MG 24 hr tablet  Generalized anxiety disorder with panic attacks - Plan: citalopram (CELEXA) 40 MG tablet  Hypertriglyceridemia  Her Synthroid was recently increased to 75 mcg due to elevated TSH.  However, there appears to have been noncompliant with the medications and not sure that she actually needs this high of a dose.  We will recheck free T4 and TSH given consistent use over the last 4 to 6 weeks.  She understands we may change the dose pending these results.  Currently asymptomatic from a thyroid standpoint however.  I have added Wellbutrin for the  depressive disorder which is not well controlled with Celexa.  We discussed that this may impact her food cravings and appetite.  Hopefully will improve her focus but she will contact me if this does not help and we can consider referral to specialist for evaluation for adult onset ADHD.  She had hypertriglyceridemia and technically falls within metabolic syndrome giving these findings.  We discussed consideration for injection therapy for weight loss.  I have given her information about a new medication coming to the market, [facility].  She seems very interested in this and will follow up in September.  Hopefully it is approved for bariatric therapies at that time  No orders of the defined types were placed in this encounter.  Meds ordered this encounter  Medications   citalopram (CELEXA) 40 MG tablet    Sig: Take 1 tablet (40 mg total) by mouth daily.    Dispense:  90 tablet    Refill:  3   buPROPion (WELLBUTRIN XL) 150 MG 24 hr tablet    Sig: Take 1 tablet (150 mg total) by mouth daily.    Dispense:  30 tablet    Refill:  [place], DO [place] [phone]

## 2020-09-18 NOTE — Patient Instructions (Signed)
You had labs performed today.  You will be contacted with the results of the labs once they are available, usually in the next 3 business days for routine lab work.  If you have an active my chart account, they will be released to your MyChart.  If you prefer to have these labs released to you via telephone, please let us know.  If you had a pap smear or biopsy performed, expect to be contacted in about 7-10 days.  Starting Wellbutrin (take each morning in case it interferes with sleep, sometimes can increase energy)  Continue Celexa  Mounjaro (new medication coming to market for weight loss)

## 2020-09-19 ENCOUNTER — Other Ambulatory Visit: Payer: Self-pay | Admitting: Family Medicine

## 2020-09-19 DIAGNOSIS — E063 Autoimmune thyroiditis: Secondary | ICD-10-CM

## 2020-09-19 LAB — CMP14+EGFR
ALT: 24 IU/L (ref 0–32)
AST: 20 IU/L (ref 0–40)
Albumin/Globulin Ratio: 1.4 (ref 1.2–2.2)
Albumin: 4.3 g/dL (ref 3.8–4.8)
Alkaline Phosphatase: 96 IU/L (ref 44–121)
BUN/Creatinine Ratio: 15 (ref 9–23)
BUN: 11 mg/dL (ref 6–20)
Bilirubin Total: 0.3 mg/dL (ref 0.0–1.2)
CO2: 22 mmol/L (ref 20–29)
Calcium: 9 mg/dL (ref 8.7–10.2)
Chloride: 103 mmol/L (ref 96–106)
Creatinine, Ser: 0.73 mg/dL (ref 0.57–1.00)
Globulin, Total: 3 g/dL (ref 1.5–4.5)
Glucose: 85 mg/dL (ref 65–99)
Potassium: 4.3 mmol/L (ref 3.5–5.2)
Sodium: 141 mmol/L (ref 134–144)
Total Protein: 7.3 g/dL (ref 6.0–8.5)
eGFR: 110 mL/min/{1.73_m2} (ref >59)

## 2020-09-19 LAB — T4, FREE: Free T4: 0.78 ng/dL — ABNORMAL LOW (ref 0.82–1.77)

## 2020-09-19 LAB — TSH: TSH: 5.03 u[IU]/mL — ABNORMAL HIGH (ref 0.450–4.500)

## 2020-09-19 MED ORDER — LEVOTHYROXINE SODIUM 88 MCG PO TABS: 88.0000 ug | ORAL_TABLET | Freq: Every day | ORAL | 0 refills | Status: DC

## 2020-10-18 ENCOUNTER — Encounter: Payer: Self-pay | Admitting: Family Medicine

## 2020-10-18 NOTE — Telephone Encounter (Signed)
Are you aware of this being a side effect of Wellbutrin?

## 2020-11-14 ENCOUNTER — Other Ambulatory Visit: Payer: Self-pay

## 2020-11-14 ENCOUNTER — Ambulatory Visit (INDEPENDENT_AMBULATORY_CARE_PROVIDER_SITE_OTHER): Payer: BC Managed Care – PPO | Admitting: Family Medicine

## 2020-11-14 ENCOUNTER — Encounter (INDEPENDENT_AMBULATORY_CARE_PROVIDER_SITE_OTHER): Payer: Self-pay | Admitting: Family Medicine

## 2020-11-14 VITALS — BP 140/80 | HR 84 | Temp 98.0°F | Ht 68.0 in | Wt 384.0 lb

## 2020-11-14 DIAGNOSIS — Z9189 Other specified personal risk factors, not elsewhere classified: Secondary | ICD-10-CM

## 2020-11-14 DIAGNOSIS — E559 Vitamin D deficiency, unspecified: Secondary | ICD-10-CM | POA: Diagnosis not present

## 2020-11-14 DIAGNOSIS — E782 Mixed hyperlipidemia: Secondary | ICD-10-CM | POA: Diagnosis not present

## 2020-11-14 DIAGNOSIS — F418 Other specified anxiety disorders: Secondary | ICD-10-CM

## 2020-11-14 DIAGNOSIS — E063 Autoimmune thyroiditis: Secondary | ICD-10-CM

## 2020-11-14 DIAGNOSIS — R5383 Other fatigue: Secondary | ICD-10-CM

## 2020-11-14 DIAGNOSIS — E7849 Other hyperlipidemia: Secondary | ICD-10-CM

## 2020-11-14 DIAGNOSIS — R0602 Shortness of breath: Secondary | ICD-10-CM

## 2020-11-14 DIAGNOSIS — Z6841 Body Mass Index (BMI) 40.0 and over, adult: Secondary | ICD-10-CM

## 2020-11-14 DIAGNOSIS — R7303 Prediabetes: Secondary | ICD-10-CM | POA: Diagnosis not present

## 2020-11-14 DIAGNOSIS — E66813 Obesity, class 3: Secondary | ICD-10-CM

## 2020-11-14 DIAGNOSIS — Z0289 Encounter for other administrative examinations: Secondary | ICD-10-CM

## 2020-11-14 DIAGNOSIS — Z1331 Encounter for screening for depression: Secondary | ICD-10-CM

## 2020-11-14 NOTE — Progress Notes (Signed)
Office: [phone]  /  Fax: [phone]    Date: November 27, 2020   Appointment Start Time: 2:05pm Duration: 38 minutes Provider: Lawerance Cruel, Psy.D. Type of Session: Intake for Individual Therapy  Location of Patient: Work (private location) Location of Provider: Provider's home (private office) Type of Contact: Telepsychological Visit via MyChart Video Visit  Informed Consent: This provider called Unknown at 3:03pm as she did not present for today's appointment. Assistance on connecting was provided. As such, today's appointment was initiated 5 minutes late. Prior to proceeding with today's appointment, two pieces of identifying information were obtained. In addition, Angela Johnson's physical location at the time of this appointment was obtained as well a phone number she could be reached at in the event of technical difficulties. Angela Johnson and this provider participated in today's telepsychological service.   The provider's role was explained to Angela Johnson. The provider reviewed and discussed issues of confidentiality, privacy, and limits therein (e.g., reporting obligations). In addition to verbal informed consent, written informed consent for psychological services was obtained prior to the initial appointment. Since the clinic is not a 24/7 crisis center, mental health emergency resources were shared and this  provider explained MyChart, e-mail, voicemail, and/or other messaging systems should be utilized only for non-emergency reasons. This provider also explained that information obtained during appointments will be placed in [name]'s medical record and relevant information will be shared with other providers at [facility] for coordination of care. Angela Johnson agreed information may be shared with other [facility] providers as needed for coordination of care and by signing the service agreement document, she provided written consent for coordination of care. Prior to  initiating telepsychological services, Angela Johnson completed an informed consent document, which included the development of a safety plan (i.e., an emergency contact and emergency resources) in the event of an emergency/crisis. Angela Johnson verbally acknowledged understanding she is ultimately responsible for understanding her insurance benefits for telepsychological and in-person services. This provider also reviewed confidentiality, as it relates to telepsychological services, as well as the rationale for telepsychological services (i.e., to reduce exposure risk to COVID-19). Angela Johnson  acknowledged understanding that appointments cannot be recorded without both party consent and she is aware she is responsible for securing confidentiality on her end of the session. Angela Johnson verbally consented to proceed.  Chief Complaint/HPI: Angela Johnson was referred by Dr. Quillian Quince due to depression with anxiety. Per the note for the initial visit with Dr. Quillian Quince on November 14, 2020, "Angela Johnson is on Celexa and Wellbutrin, and she notes emotional eating behaviors." The note for the initial appointment with Dr. Quillian Quince further indicated the following: "Her family eats meals together, she thinks her family will eat healthier with her, her desired weight loss is 184 lbs, she has been heavy most of her life, she started gaining weight around the time she got pregnant in 2012, her heaviest weight ever was 390 pounds, she has significant food cravings issues, she skips meals frequently, she is frequently drinking liquids with calories, she frequently makes poor food choices, she has problems with excessive hunger, she frequently eats larger portions than normal, and she struggles with emotional eating." Angela Johnson's Food and Mood (modified PHQ-9) score on November 14, 2020 was 21.  During today's appointment, Angela Johnson was verbally administered a questionnaire assessing various behaviors related to emotional eating behaviors. Angela Johnson endorsed the  following: overeat when you are celebrating, experience food cravings on a regular basis, use food to help you cope with emotional situations,  find food is comforting to you, overeat when you are worried about something, overeat frequently when you are bored or lonely, not worry about what you eat when you are in a good mood, overeat when you are alone, but eat much less when you are with other people, eat to help you stay awake, and eat as a reward. She shared she craves soft drinks, pizza, and [place] food. Angela Johnson believes the onset of emotional eating behaviors was likely when she "became a mother," which was 15 years ago. She feels she suffered from postpartum depression. She described the current frequency of emotional eating behaviors as "few times a week." In addition, Angela Johnson denied a history of binge eating behaviors. Angela Johnson denied a history of restricting food intake, purging and engagement in other compensatory strategies for weight loss, and has never been diagnosed with an eating disorder. She also denied a history of treatment for emotional eating. Currently, Angela Johnson indicated being busy triggers emotional eating behaviors, adding it results in her eating out often. She also discussed when her kids want to eat, she often wants to eat as well. Additionally, she acknowledged a desire to snack when she is physically in the office for work. Furthermore, Angela Johnson reported she goes through "bouts" where she does not want to be around others or do anything, adding that typically she is a "people person." She described the frequency as once a week, adding the episode lasts approximately 1-2 days.   Mental Status Examination:  Appearance: well groomed and appropriate hygiene  Behavior: appropriate to circumstances Mood: euthymic Affect: mood congruent Speech: normal in rate, volume, and tone Eye Contact: appropriate Psychomotor Activity: unable to assess Gait: unable to assess  Thought Process: linear,  logical, and goal directed  Thought Content/Perception: denies suicidal and homicidal ideation, plan, and intent, no hallucinations, delusions, bizarre thinking or behavior reported or observed, and denies ideation and engagement in self-injurious behaviors Orientation: time, person, place, and purpose of appointment Memory/Concentration: memory, attention, language, and fund of knowledge intact  Insight/Judgment: fair  Family & Psychosocial History: Makiah reported she is married and she has two children (ages 63 and 81). 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 Additionally, Margareth shared her highest level of education obtained is "some college." Currently, Shaquayla's social support system consists of her husband (married for 13 years), children, parents, brother, sister in Social worker, and friends. Moreover, Thomas stated she resides with her husband and children.   Medical History:  Past Medical History:  Diagnosis Date   Alcohol abuse    Anxiety    Arthritis    Back pain    Depression    Drug abuse (HCC)    Edema, lower extremity    GERD (gastroesophageal reflux disease)    Gonorrhea 03/31/2005   Treated   Herniated disc    Hypertension    Hypothyroidism    IBS (irritable bowel syndrome)    Irritable bowel syndrome (IBS)    Other hyperlipidemia    Palpitations    Pre-diabetes    SOB (shortness of breath)    Vitamin D deficiency    Past Surgical History:  Procedure Laterality Date   oral     wisdom teeth   Current Outpatient Medications on File Prior to Visit  Medication Sig Dispense Refill   buPROPion (WELLBUTRIN XL) 150 MG 24 hr tablet TAKE ONE TABLET ONCE DAILY 30 tablet 0   cetirizine (ZYRTEC) 10 MG tablet Take 10 mg by mouth daily. allergies  citalopram (CELEXA) 40 MG tablet TAKE 1 TABLET DAILY 90 tablet 0   fluticasone (FLONASE) 50 MCG/ACT nasal spray Place 2 sprays into both nostrils daily. (Patient taking differently: Place 2 sprays into  both nostrils as needed.) 16 g 6   levothyroxine (SYNTHROID) 100 MCG tablet Take 1 tablet (100 mcg total) by mouth daily. 90 tablet 3   norgestrel-ethinyl estradiol (LO/OVRAL,CRYSELLE) 0.3-30 MG-MCG tablet Take 1 tablet by mouth daily.     Vitamin D, Ergocalciferol, (DRISDOL) 1.25 MG (50000 UNIT) CAPS capsule TAKE 1 CAPSULE ONCE A WEEK     No current facility-administered medications on file prior to visit.  Medication compliant; no concerns.   Mental Health History: Dorathy DaftKayla reported she has never attended therapeutic services. She indicated her PCP currently prescribes Celexa, Wellbutrin, and Xanax PRN, noting the medications are helpful. Dorathy DaftKayla reported there is no history of hospitalizations for psychiatric concerns. Dorathy DaftKayla reported her parents have a history of anxiety and depression. Dorathy DaftKayla reported there is no history of trauma including psychological, physical , and sexual abuse, as well as neglect.   Dorathy DaftKayla reported having some mood changes over the weekend, including crying spells due to running out of Celexa. She further reported experiencing limited self-care, challenges with focusing, difficulty finishing tasks, and becoming easily distracted, noting the aforementioned is independent of mood. Dorathy DaftKayla also indicated a history of panic attacks, adding the frequency decreased to 1-2xs a month since starting psychotropic medications. She last experienced a panic attack over the weekend due to not having her Celexa prescription. She described panic attacks as characterized by heart racing, dizziness, difficulty breathing, clamminess, and racing thoughts. She copes by taking Xanax. Moreover, Dorathy DaftKayla disclosed a history of substance abuse. More specifically, Dorathy DaftKayla disclosed a history of marijuana, cocaine, and "just about everything" use. She indicated she "quit everything cold [place]" in 2006, including alcohol. She denied current tobacco use. Regarding caffeine intake, Geanie reported consuming three sodas  (12oz or 16.9oz bottles) daily, adding she will consume coffee occassionally. Furthermore, Dorathy DaftKayla indicated she is not experiencing the following: hallucinations and delusions, paranoia, symptoms of mania , social withdrawal, crying spells, and obsessions and compulsions. She also denied history of and current suicidal ideation, plan, and intent; history of and current homicidal ideation, plan, and intent; and history of and current engagement in self-harm.  The following strengths were reported by [facility]: people person, very friendly, involved, passion for working with teenagers, faith, spending time with family, and cooking/baking. The following strengths were observed by this provider: ability to express thoughts and feelings during the therapeutic session, ability to establish and benefit from a therapeutic relationship, willingness to work toward established goal(s) with the clinic and ability to engage in reciprocal conversation.  Legal History: Dorathy DaftKayla reported there is no history of legal involvement.   Structured Assessments Results: The Patient Health Questionnaire-9 (PHQ-9) is a self-report measure that assesses symptoms and severity of depression over the course of the last two weeks. Elie obtained a score of 6 suggesting mild depression. Dorathy DaftKayla finds the endorsed symptoms to be not difficult at all. [0= Not at all; 1= Several days; 2= More than half the days; 3= Nearly every day] Little interest or pleasure in doing things 1  Feeling down, depressed, or hopeless 0  Trouble falling or staying asleep, or sleeping too much 3  Feeling tired or having little energy 2  Poor appetite or overeating 0  Feeling bad about yourself --- or that you are a failure or have let yourself or your family  down 0  Trouble concentrating on things, such as reading the newspaper or watching television 0  Moving or speaking so slowly that other people could have noticed? Or the opposite --- being so fidgety or  restless that you have been moving around a lot more than usual 0  Thoughts that you would be better off dead or hurting yourself in some way 0  PHQ-9 Score 6    The Generalized Anxiety Disorder-7 (GAD-7) is a brief self-report measure that assesses symptoms of anxiety over the course of the last two weeks. Maritta obtained a score of 1 suggesting minimal anxiety. Wajiha finds the endorsed symptoms to be not difficult at all. [0= Not at all; 1= Several days; 2= Over half the days; 3= Nearly every day] Feeling nervous, anxious, on edge 1  Not being able to stop or control worrying 0  Worrying too much about different things 0  Trouble relaxing 0  Being so restless that it's hard to sit still 0  Becoming easily annoyed or irritable 0  Feeling afraid as if something awful might happen 0  GAD-7 Score 1   Interventions:  Conducted a chart review Focused on rapport building Verbally administered PHQ-9 and GAD-7 for symptom monitoring Verbally administered Food & Mood questionnaire to assess various behaviors related to emotional eating Provided emphatic reflections and validation Collaborated with patient on a treatment goal  Psychoeducation provided regarding physical versus emotional hunger Recommended/discussed option for longer-term therapeutic services  Provisional DSM-5 Diagnosis(es): F50.89 Other Specified Feeding or Eating Disorder, Emotional Eating Behaviors, 314.01 (F90.9) Unspecified Attention-Deficit/Hyperactivity Disorder , and F41.9 Unspecified Anxiety Disorder  Plan: Ramesha appears able and willing to participate as evidenced by collaboration on a treatment goal, engagement in reciprocal conversation, and asking questions as needed for clarification. The next appointment will be scheduled in two weeks, which will be via MyChart Video Visit. The following treatment goal was established: increase coping skills. This provider will regularly review the treatment plan and medical chart to  keep informed of status changes. Lachlyn expressed understanding and agreement with the initial treatment plan of care. Threasa will be sent a handout via e-mail to utilize between now and the next appointment to increase awareness of hunger patterns and subsequent eating. Baker provided verbal consent during today's appointment for this provider to send the handout via e-mail. This provider recommended longer-term therapeutic services. Scarlette provided verbal consent for this provider to e-mail referral options.

## 2020-11-15 ENCOUNTER — Telehealth (INDEPENDENT_AMBULATORY_CARE_PROVIDER_SITE_OTHER): Payer: BC Managed Care – PPO | Admitting: Family Medicine

## 2020-11-15 DIAGNOSIS — E063 Autoimmune thyroiditis: Secondary | ICD-10-CM | POA: Diagnosis not present

## 2020-11-15 DIAGNOSIS — E538 Deficiency of other specified B group vitamins: Secondary | ICD-10-CM | POA: Diagnosis not present

## 2020-11-15 DIAGNOSIS — E559 Vitamin D deficiency, unspecified: Secondary | ICD-10-CM

## 2020-11-15 LAB — LIPID PANEL WITH LDL/HDL RATIO
Cholesterol, Total: 230 mg/dL — ABNORMAL HIGH (ref 100–199)
HDL: 43 mg/dL (ref >39)
LDL Chol Calc (NIH): 136 mg/dL — ABNORMAL HIGH (ref 0–99)
LDL/HDL Ratio: 3.2 ratio (ref 0.0–3.2)
Triglycerides: 286 mg/dL — ABNORMAL HIGH (ref 0–149)
VLDL Cholesterol Cal: 51 mg/dL — ABNORMAL HIGH (ref 5–40)

## 2020-11-15 LAB — CBC WITH DIFFERENTIAL
Basophils Absolute: 0 10*3/uL (ref 0.0–0.2)
Basos: 1 %
EOS (ABSOLUTE): 0.2 10*3/uL (ref 0.0–0.4)
Eos: 3 %
Hematocrit: 36.9 % (ref 34.0–46.6)
Hemoglobin: 11.8 g/dL (ref 11.1–15.9)
Immature Grans (Abs): 0 10*3/uL (ref 0.0–0.1)
Immature Granulocytes: 0 %
Lymphocytes Absolute: 2.6 10*3/uL (ref 0.7–3.1)
Lymphs: 34 %
MCH: 29.2 pg (ref 26.6–33.0)
MCHC: 32 g/dL (ref 31.5–35.7)
MCV: 91 fL (ref 79–97)
Monocytes Absolute: 0.4 10*3/uL (ref 0.1–0.9)
Monocytes: 6 %
Neutrophils Absolute: 4.4 10*3/uL (ref 1.4–7.0)
Neutrophils: 56 %
RBC: 4.04 x10E6/uL (ref 3.77–5.28)
RDW: 13.4 % (ref 11.7–15.4)
WBC: 7.7 10*3/uL (ref 3.4–10.8)

## 2020-11-15 LAB — COMPREHENSIVE METABOLIC PANEL
ALT: 10 IU/L (ref 0–32)
AST: 13 IU/L (ref 0–40)
Albumin/Globulin Ratio: 1.5 (ref 1.2–2.2)
Albumin: 4.3 g/dL (ref 3.8–4.8)
Alkaline Phosphatase: 77 IU/L (ref 44–121)
BUN/Creatinine Ratio: 17 (ref 9–23)
BUN: 12 mg/dL (ref 6–20)
Bilirubin Total: <0.2 mg/dL (ref 0.0–1.2)
CO2: 20 mmol/L (ref 20–29)
Calcium: 9 mg/dL (ref 8.7–10.2)
Chloride: 100 mmol/L (ref 96–106)
Creatinine, Ser: 0.69 mg/dL (ref 0.57–1.00)
Globulin, Total: 2.8 g/dL (ref 1.5–4.5)
Glucose: 96 mg/dL (ref 65–99)
Potassium: 4.4 mmol/L (ref 3.5–5.2)
Sodium: 137 mmol/L (ref 134–144)
Total Protein: 7.1 g/dL (ref 6.0–8.5)
eGFR: 116 mL/min/{1.73_m2} (ref >59)

## 2020-11-15 LAB — HEMOGLOBIN A1C
Est. average glucose Bld gHb Est-mCnc: 111 mg/dL
Hgb A1c MFr Bld: 5.5 % (ref 4.8–5.6)

## 2020-11-15 LAB — VITAMIN D 25 HYDROXY (VIT D DEFICIENCY, FRACTURES): Vit D, 25-Hydroxy: 21.4 ng/mL — ABNORMAL LOW (ref 30.0–100.0)

## 2020-11-15 LAB — FOLATE: Folate: 8.1 ng/mL (ref >3.0)

## 2020-11-15 LAB — TSH: TSH: 6.72 u[IU]/mL — ABNORMAL HIGH (ref 0.450–4.500)

## 2020-11-15 LAB — T3: T3, Total: 216 ng/dL — ABNORMAL HIGH (ref 71–180)

## 2020-11-15 LAB — INSULIN, RANDOM: INSULIN: 27.7 u[IU]/mL — ABNORMAL HIGH (ref 2.6–24.9)

## 2020-11-15 LAB — T4, FREE: Free T4: 0.82 ng/dL (ref 0.82–1.77)

## 2020-11-15 LAB — VITAMIN B12: Vitamin B-12: 203 pg/mL — ABNORMAL LOW (ref 232–1245)

## 2020-11-15 MED ORDER — LEVOTHYROXINE SODIUM 100 MCG PO TABS: 100.0000 ug | ORAL_TABLET | Freq: Every day | ORAL | 3 refills | Status: DC

## 2020-11-15 NOTE — Progress Notes (Signed)
Chief Complaint:   OBESITY Angela Johnson (MR# [id number]) is a 35 y.o. female who presents for evaluation and treatment of obesity and related comorbidities. Current BMI is Body mass index is 58.39 kg/m. Angela Johnson has been struggling with her weight for many years and has been unsuccessful in either losing weight, maintaining weight loss, or reaching her healthy weight goal.  Angela Johnson is currently in the action stage of change and ready to dedicate time achieving and maintaining a healthier weight. Angela Johnson is interested in becoming our patient and working on intensive lifestyle modifications including (but not limited to) diet and exercise for weight loss.  Angela Johnson's habits were reviewed today and are as follows: Her family eats meals together, she thinks her family will eat healthier with her, her desired weight loss is 184 lbs, she has been heavy most of her life, she started gaining weight around the time she got pregnant in 2012, her heaviest weight ever was 390 pounds, she has significant food cravings issues, she skips meals frequently, she is frequently drinking liquids with calories, she frequently makes poor food choices, she has problems with excessive hunger, she frequently eats larger portions than normal, and she struggles with emotional eating.  Depression Screen Angela Johnson's Food and Mood (modified PHQ-9) score was 21.  Depression screen PHQ 2/9 11/14/2020  Decreased Interest 3  Down, Depressed, Hopeless 3  PHQ - 2 Score 6  Altered sleeping 2  Tired, decreased energy 3  Change in appetite 3  Feeling bad or failure about yourself  3  Trouble concentrating 3  Moving slowly or fidgety/restless 1  Suicidal thoughts 0  PHQ-9 Score 21  Difficult doing work/chores Somewhat difficult   Subjective:   1. Other fatigue Angela Johnson admits to daytime somnolence and admits to waking up still tired. Patent has a history of symptoms of daytime fatigue. Angela Johnson generally gets 6 or 7 hours of sleep per  night, and states that she has nightime awakenings and generally restful sleep. Angela Johnson is present. Apneic episodes are not present. Epworth Sleepiness Score is 13.  2. SOB (shortness of breath) on exertion Angela Johnson notes increasing shortness of breath with exercising and seems to be worsening over time with weight gain. She notes getting out of breath sooner with activity than she used to. This has not gotten worse recently. Chyenne denies shortness of breath at rest or orthopnea.  3. Pre-diabetes Angela Johnson has a history of elevated insulin and glucose levels. She is not on metformin currently, and she is ready to work on diet and exercise.  4. Hashimoto's thyroiditis Angela Johnson recently changed her levothyroxine from 75-88 mcg. She is due to have labs again. She struggles to take her medications on an empty stomach.  5. Other hyperlipidemia Angela Johnson is working on diet and exercise to help improve her triglycerides.  6. Vitamin D deficiency Angela Johnson has a history of Vit D deficiency, and she notes fatigue.  7. Depression with anxiety Angela Johnson is on Celexa and Wellbutrin, and she notes emotional eating behaviors.  8. At risk for heart disease Angela Johnson is at a higher than average risk for cardiovascular disease due to obesity.   Assessment/Plan:   1. Other fatigue Angela Johnson does feel that her weight is causing her energy to be lower than it should be. Fatigue may be related to obesity, depression or many other causes. Labs will be ordered, and in the meanwhile, Angela Johnson will focus on self care including making healthy food choices, increasing physical activity and focusing on  stress reduction.  - Vitamin B12 - CBC With Differential - EKG 12-Lead  2. SOB (shortness of breath) on exertion [name] does feel that she gets out of breath more easily that she used to when she exercises. Angela Johnson's shortness of breath appears to be obesity related and exercise induced. She has agreed to work on weight loss and gradually increase  exercise to treat her exercise induced shortness of breath. Will continue to monitor closely.  3. Pre-diabetes Angela Johnson will start on her Category 4 plan, and will continue to work on weight loss, exercise, and decreasing simple carbohydrates to help decrease the risk of diabetes. We will check labs today.  - Insulin, random - Comprehensive metabolic panel - Hemoglobin A1c  4. Hashimoto's thyroiditis We will check labs today, and we will follow up at [facility] next office visit.  - TSH - T3 - T4, free  5. Other hyperlipidemia Cardiovascular risk and specific lipid/LDL goals reviewed. We discussed several lifestyle modifications today. We will check labs today. Angela Johnson will work on diet, exercise and weight loss efforts. Orders and follow up as documented in patient record.   - Lipid Panel With LDL/HDL Ratio  6. Vitamin D deficiency Low Vitamin D level contributes to fatigue and are associated with obesity, breast, and colon cancer. We will check labs today. Angela Johnson will follow-up for routine testing of Vitamin D, at least 2-3 times per year to avoid over-replacement.  - VITAMIN D 25 Hydroxy (Vit-D Deficiency, Fractures) - Folate  7. Depression with anxiety Behavior modification techniques were discussed today to help Angela Johnson deal with her depression and anxiety. We will refer Angela Johnson to Dr. Dewaine Johnson, our Bariatric Psychologist for evaluation. Orders and follow up as documented in patient record.   8. At risk for heart disease Angela Johnson was given approximately 30 minutes of coronary artery disease prevention counseling today. She is 35 y.o. female and has risk factors for heart disease including obesity. We discussed intensive lifestyle modifications today with an emphasis on specific weight loss instructions and strategies.   Repetitive spaced learning was employed today to elicit superior memory formation and behavioral change.  9. Obesity with current BMI 58.5 Angela Johnson is currently in the action  stage of change and her goal is to continue with weight loss efforts. I recommend Angela Johnson begin the structured treatment plan as follows:  She has agreed to the Category 4 Plan.  Exercise goals: No exercise has been prescribed for now, while we concentrate on nutritional changes.  Behavioral modification strategies: increasing lean protein intake and no skipping meals.  She was informed of the importance of frequent follow-up visits to maximize her success with intensive lifestyle modifications for her multiple health conditions. She was informed we would discuss her lab results at her next visit unless there is a critical issue that needs to be addressed sooner. Suleika agreed to keep her next visit at the agreed upon time to discuss these results.  Objective:   Blood pressure 140/80, pulse 84, temperature 98 F (36.7 C), height 5\' 8"  (1.727 m), weight (!) 384 lb (174.2 kg), SpO2 98 %. Body mass index is 58.39 kg/m.  EKG: Normal sinus rhythm, rate 82 BPM.  Indirect Calorimeter completed today shows a VO2 of 427 and a REE of 2952.  Her calculated basal metabolic rate is thus her basal metabolic rate is better than expected.  General: Cooperative, alert, well developed, in no acute distress. HEENT: Conjunctivae and lids unremarkable. Cardiovascular: Regular rhythm.  Lungs: Normal work of breathing.  Neurologic: No focal deficits.   Lab Results  Component Value Date   CREATININE 0.69 11/14/2020   BUN 12 11/14/2020   NA 137 11/14/2020   K 4.4 11/14/2020   CL 100 11/14/2020   CO2 20 11/14/2020   Lab Results  Component Value Date   ALT 10 11/14/2020   AST 13 11/14/2020   ALKPHOS 77 11/14/2020   BILITOT <0.2 11/14/2020   Lab Results  Component Value Date   HGBA1C 5.5 11/14/2020   HGBA1C 5.2 11/15/2019   HGBA1C 5.3 03/14/2019   HGBA1C 5.0 06/15/2017   Lab Results  Component Value Date   INSULIN 27.7 (H) 11/14/2020   INSULIN 158.9 (H) 06/15/2017   Lab Results  Component  Value Date   TSH 6.720 (H) 11/14/2020   Lab Results  Component Value Date   CHOL 230 (H) 11/14/2020   HDL 43 11/14/2020   LDLCALC 136 (H) 11/14/2020   LDLDIRECT 112 (H) 11/15/2019   TRIG 286 (H) 11/14/2020   CHOLHDL 5.5 (H) 03/14/2019   Lab Results  Component Value Date   WBC 7.7 11/14/2020   HGB 11.8 11/14/2020   HCT 36.9 11/14/2020   MCV 91 11/14/2020   PLT 214 03/14/2019   No results found for: IRON, TIBC, FERRITIN  Attestation Statements:   Reviewed by clinician on day of visit: allergies, medications, problem list, medical history, surgical history, family history, social history, and previous encounter notes.   I, Burt Knack, am acting as transcriptionist for Quillian Quince, MD.  I have reviewed the above documentation for accuracy and completeness, and I agree with the above. - Quillian Quince, MD

## 2020-11-15 NOTE — Progress Notes (Signed)
MyChart Video visit  Subjective: QM:Angela Johnson PCP: Angela Ip, DO TIW:PYKDX L [name] is a 35 y.o. female. Patient provides verbal consent for consult held via video.  Due to COVID-19 pandemic this visit was conducted virtually. This visit type was conducted due to national recommendations for restrictions regarding the COVID-19 Pandemic (e.g. social distancing, sheltering in place) in an effort to limit this patient's exposure and mitigate transmission in our community. All issues noted in this document were discussed and addressed.  A physical exam was not performed with this format.   Location of patient: car Location of provider: [facility] Others present for call: son  1. Obesity/ Hypothyroidism At last visit we increased her Synthroid to 88 mcg daily.  She has been taking this at nighttime.  No significant change in energy.  She had labs obtained with her healthy weight and wellness provider yesterday and has yet to get the results of that but would be glad to review that with me today.  She is on day number one of her diet plan and so far is really tolerating it well.  She has some struggle with the intake of yogurt but is trying to make the best of it.   ROS: Per HPI  Allergies  Allergen Reactions   Ciprofloxacin Hcl Itching   Latex    Zithromax [Azithromycin]     dizziness   Past Medical History:  Diagnosis Date   Alcohol abuse    Anxiety    Arthritis    Back pain    Depression    Drug abuse (HCC)    Edema, lower extremity    GERD (gastroesophageal reflux disease)    Gonorrhea 03/31/2005   Treated   Herniated disc    Hypertension    Hypothyroidism    IBS (irritable bowel syndrome)    Irritable bowel syndrome (IBS)    Other hyperlipidemia    Palpitations    Pre-diabetes    SOB (shortness of breath)    Vitamin D deficiency     Current Outpatient Medications:    buPROPion (WELLBUTRIN XL) 150 MG 24 hr tablet, Take 1 tablet (150 mg total) by mouth  daily., Disp: 30 tablet, Rfl: 1   cetirizine (ZYRTEC) 10 MG tablet, Take 10 mg by mouth daily. allergies , Disp: , Rfl:    citalopram (CELEXA) 40 MG tablet, Take 1 tablet (40 mg total) by mouth daily., Disp: 90 tablet, Rfl: 3   fluticasone (FLONASE) 50 MCG/ACT nasal spray, Place 2 sprays into both nostrils daily. (Patient taking differently: Place 2 sprays into both nostrils as needed.), Disp: 16 g, Rfl: 6   levothyroxine (SYNTHROID) 88 MCG tablet, Take 1 tablet (88 mcg total) by mouth daily., Disp: 90 tablet, Rfl: 0   norgestrel-ethinyl estradiol (LO/OVRAL,CRYSELLE) 0.3-30 MG-MCG tablet, Take 1 tablet by mouth daily., Disp: , Rfl:   Assessment/ Plan: 35 y.o. female   Morbid obesity (HCC)  Hashimoto's thyroiditis - Plan: levothyroxine (SYNTHROID) 100 MCG tablet  B12 deficiency  Vitamin D deficiency  Now with healthy weight and wellness which I think is fabulous.  I am optimistic about what her outcomes will be.  Her thyroid is still subtherapeutic and therefore I will advance her to Synthroid 100 mcg daily.  She has future orders in for repeat thyroid levels but anticipate these will likely be repeated with her next laboratory draw with healthy weight and wellness  B12 deficiency noted as well.  I offered once weekly B12 injections x1 month and transition over to once  monthly for a couple of months before transitioning over to oral B12 supplements.  We will place future order for B12 recheck but I did not anticipate this will likely be rechecked with her healthy weight and wellness labs.  We will have nursing call to schedule B12 injections here in office  She has high-dose vitamin D at home as she had not been getting the refills that we previously sent in.  It still insufficient and she knows to restart this supplement.  Start time: 3:51pm End time: 3:58pm  Total time spent on patient care (including video visit/ documentation): 7 minutes  Cruze Zingaro Hulen Skains, DO [facility] [phone]

## 2020-11-16 ENCOUNTER — Telehealth: Payer: Self-pay

## 2020-11-16 NOTE — Telephone Encounter (Signed)
CALLED PT TO SCHEDULE B12 INJECTIONS PER DR. G BUT PT DID NOT ANSWER.

## 2020-11-20 ENCOUNTER — Other Ambulatory Visit: Payer: Self-pay | Admitting: Family Medicine

## 2020-11-20 DIAGNOSIS — F410 Panic disorder [episodic paroxysmal anxiety] without agoraphobia: Secondary | ICD-10-CM

## 2020-11-20 DIAGNOSIS — F329 Major depressive disorder, single episode, unspecified: Secondary | ICD-10-CM

## 2020-11-20 DIAGNOSIS — F32A Depression, unspecified: Secondary | ICD-10-CM

## 2020-11-27 ENCOUNTER — Telehealth (INDEPENDENT_AMBULATORY_CARE_PROVIDER_SITE_OTHER): Payer: BC Managed Care – PPO | Admitting: Psychology

## 2020-11-27 DIAGNOSIS — F5089 Other specified eating disorder: Secondary | ICD-10-CM

## 2020-11-27 DIAGNOSIS — F419 Anxiety disorder, unspecified: Secondary | ICD-10-CM | POA: Diagnosis not present

## 2020-11-27 DIAGNOSIS — F909 Attention-deficit hyperactivity disorder, unspecified type: Secondary | ICD-10-CM

## 2020-11-27 NOTE — Progress Notes (Unsigned)
  Office: [phone]  /  Fax: [phone]    Date: December 11, 2020   Appointment Start Time: *** Duration: *** minutes Provider: Lawerance Cruel, Psy.D. Type of Session: Individual Therapy  Location of Patient: {gbptloc:23249} Location of Provider: Provider's Home (private office) Type of Contact: Telepsychological Visit via MyChart Video Visit  Session Content: Angela Johnson is a 35 y.o. female presenting for a follow-up appointment to address the previously established treatment goal of increasing coping skills.Today's appointment was a telepsychological visit due to COVID-19. Angela Johnson provided verbal consent for today's telepsychological appointment and she is aware she is responsible for securing confidentiality on her end of the session. Prior to proceeding with today's appointment, Angela Johnson's physical location at the time of this appointment was obtained as well a phone number she could be reached at in the event of technical difficulties. Angela Johnson and this provider participated in today's telepsychological service.   This provider conducted a brief check-in. *** Angela Johnson was receptive to today's appointment as evidenced by openness to sharing, responsiveness to feedback, and {gbreceptiveness:23401}.  Mental Status Examination:  Appearance: {Appearance:22431} Behavior: {Behavior:22445} Mood: {gbmood:21757} Affect: {Affect:22436} Speech: {Speech:22432} Eye Contact: {Eye Contact:22433} Psychomotor Activity: {Motor Activity:22434} Gait: {gbgait:23404} Thought Process: {thought process:22448}  Thought Content/Perception: {disturbances:22451} Orientation: {Orientation:22437} Memory/Concentration: {gbcognition:22449} Insight/Judgment: {Insight:22446}  Interventions:  {Interventions for Progress Notes:23405}  DSM-5 Diagnosis(es): F50.89 Other Specified Feeding or Eating Disorder, Emotional Eating Behaviors, 314.01 (F90.9) Unspecified Attention-Deficit/Hyperactivity Disorder , and F41.9 Unspecified  Anxiety Disorder  Treatment Goal & Progress: During the initial appointment with this provider, the following treatment goal was established: increase coping skills. Angela Johnson has demonstrated progress in her goal as evidenced by {gbtxprogress:22839}. Angela Johnson also {gbtxprogress2:22951}.  Plan: The next appointment will be scheduled in {gbweeks:21758}, which will be {gbtxmodality:23402}. The next session will focus on {Plan for Next Appointment:23400}.

## 2020-11-28 ENCOUNTER — Encounter (INDEPENDENT_AMBULATORY_CARE_PROVIDER_SITE_OTHER): Payer: Self-pay | Admitting: Family Medicine

## 2020-11-28 ENCOUNTER — Other Ambulatory Visit: Payer: Self-pay

## 2020-11-28 ENCOUNTER — Ambulatory Visit (INDEPENDENT_AMBULATORY_CARE_PROVIDER_SITE_OTHER): Payer: BC Managed Care – PPO | Admitting: Family Medicine

## 2020-11-28 VITALS — BP 122/78 | HR 74 | Temp 98.2°F | Ht 68.0 in | Wt 376.0 lb

## 2020-11-28 DIAGNOSIS — E039 Hypothyroidism, unspecified: Secondary | ICD-10-CM

## 2020-11-28 DIAGNOSIS — E782 Mixed hyperlipidemia: Secondary | ICD-10-CM

## 2020-11-28 DIAGNOSIS — E559 Vitamin D deficiency, unspecified: Secondary | ICD-10-CM | POA: Diagnosis not present

## 2020-11-28 DIAGNOSIS — R7303 Prediabetes: Secondary | ICD-10-CM | POA: Diagnosis not present

## 2020-11-28 DIAGNOSIS — Z6841 Body Mass Index (BMI) 40.0 and over, adult: Secondary | ICD-10-CM

## 2020-11-29 NOTE — Progress Notes (Signed)
Chief Complaint:   OBESITY Angela Johnson is here to discuss her progress with her obesity treatment plan along with follow-up of her obesity related diagnoses. Angela Johnson is on the Category 4 Plan and states she is following her eating plan approximately 90% of the time. Georgianne states she is doing 0 minutes 0 times per week.  Today's visit was #: 2 Starting weight: 384 lbs Starting date: 11/14/2020 Today's weight: 376 lbs Today's date: 11/28/2020 Total lbs lost to date: 8 Total lbs lost since last in-office visit: 8  Interim History: Cyleigh has done very well with her eating plan. Her hunger was controlled but she became very bored with her plan. She is hoping to have more choices.  Subjective:   1. Mixed hyperlipidemia [name] is not on statin, and she is working on diet, exercise, and weight loss. I discussed labs with the patient today.  2. Vitamin D deficiency Angela Johnson's Vit D level is low, and her primary care physician started her on Vit D prescription. I discussed labs with the patient today.  3. Pre-diabetes Angela Johnson's fasting insulin is elevated which is contributing to her weight gain. I discussed labs with the patient today.  4. Hypothyroidism, unspecified type Angela Johnson was on 88 mcg of Synthroid and increased her dose to 100 mcg daily. I discussed labs with the patient today.  Assessment/Plan:   1. Mixed hyperlipidemia Cardiovascular risk and specific lipid/LDL goals reviewed. We discussed several lifestyle modifications today. Nico will continue to work on diet, exercise and weight loss efforts. We will recheck labs in 3 months. Orders and follow up as documented in patient record.   2. Vitamin D deficiency Low Vitamin D level contributes to fatigue and are associated with obesity, breast, and colon cancer. [place] will continue prescription Vitamin D 50,000 IU every week and we will recheck labs in 3 months. She will follow-up for routine testing of Vitamin D, at least 2-3 times per year  to avoid over-replacement.  3. Pre-diabetes Angela Johnson will continue to work on diet, exercise, and decreasing simple carbohydrates to help decrease the risk of diabetes. She defers metformin for now.  4. Hypothyroidism, unspecified type Angela Johnson will continue Synthroid 100 mcg daily, and we will recheck labs in 6 months. Orders and follow up as documented in patient record.  5. Obesity with current BMI 57.2 Angela Johnson is currently in the action stage of change. As such, her goal is to continue with weight loss efforts. She has agreed to change to keeping a food journal and adhering to recommended goals of 1600-2000 calories and 110 grams of protein daily.   Behavioral modification strategies: increasing lean protein intake, no skipping meals, and keeping a strict food journal.  [name] has agreed to follow-up with our clinic in 2 weeks. She was informed of the importance of frequent follow-up visits to maximize her success with intensive lifestyle modifications for her multiple health conditions.   Objective:   Blood pressure 122/78, pulse 74, temperature 98.2 F (36.8 C), height 5\' 8"  (1.727 m), weight (!) 376 lb (170.6 kg), SpO2 98 %. Body mass index is 57.17 kg/m.  General: Cooperative, alert, well developed, in no acute distress. HEENT: Conjunctivae and lids unremarkable. Cardiovascular: Regular rhythm.  Lungs: Normal work of breathing. Neurologic: No focal deficits.   Lab Results  Component Value Date   CREATININE 0.69 11/14/2020   BUN 12 11/14/2020   NA 137 11/14/2020   K 4.4 11/14/2020   CL 100 11/14/2020   CO2 20 11/14/2020  Lab Results  Component Value Date   ALT 10 11/14/2020   AST 13 11/14/2020   ALKPHOS 77 11/14/2020   BILITOT <0.2 11/14/2020   Lab Results  Component Value Date   HGBA1C 5.5 11/14/2020   HGBA1C 5.2 11/15/2019   HGBA1C 5.3 03/14/2019   HGBA1C 5.0 06/15/2017   Lab Results  Component Value Date   INSULIN 27.7 (H) 11/14/2020   INSULIN 158.9 (H)  06/15/2017   Lab Results  Component Value Date   TSH 6.720 (H) 11/14/2020   Lab Results  Component Value Date   CHOL 230 (H) 11/14/2020   HDL 43 11/14/2020   LDLCALC 136 (H) 11/14/2020   LDLDIRECT 112 (H) 11/15/2019   TRIG 286 (H) 11/14/2020   CHOLHDL 5.5 (H) 03/14/2019   Lab Results  Component Value Date   VD25OH 21.4 (L) 11/14/2020   Lab Results  Component Value Date   WBC 7.7 11/14/2020   HGB 11.8 11/14/2020   HCT 36.9 11/14/2020   MCV 91 11/14/2020   PLT 214 03/14/2019   No results found for: IRON, TIBC, FERRITIN  Attestation Statements:   Reviewed by clinician on day of visit: allergies, medications, problem list, medical history, surgical history, family history, social history, and previous encounter notes.  Time spent on visit including pre-visit chart review and post-visit care and charting was 48 minutes.    I, Burt Knack, am acting as transcriptionist for Quillian Quince, MD.  I have reviewed the above documentation for accuracy and completeness, and I agree with the above. -  Quillian Quince, MD

## 2020-12-11 ENCOUNTER — Telehealth (INDEPENDENT_AMBULATORY_CARE_PROVIDER_SITE_OTHER): Payer: BC Managed Care – PPO | Admitting: Psychology

## 2020-12-11 ENCOUNTER — Encounter: Payer: Self-pay | Admitting: Family Medicine

## 2020-12-15 ENCOUNTER — Other Ambulatory Visit: Payer: Self-pay | Admitting: Family Medicine

## 2020-12-15 DIAGNOSIS — E063 Autoimmune thyroiditis: Secondary | ICD-10-CM

## 2020-12-17 ENCOUNTER — Ambulatory Visit (INDEPENDENT_AMBULATORY_CARE_PROVIDER_SITE_OTHER): Payer: BC Managed Care – PPO | Admitting: Family Medicine

## 2020-12-19 ENCOUNTER — Ambulatory Visit (INDEPENDENT_AMBULATORY_CARE_PROVIDER_SITE_OTHER): Payer: BC Managed Care – PPO | Admitting: Family Medicine

## 2020-12-19 ENCOUNTER — Ambulatory Visit: Payer: BC Managed Care – PPO | Admitting: Family Medicine

## 2020-12-25 ENCOUNTER — Other Ambulatory Visit: Payer: Self-pay | Admitting: Family Medicine

## 2020-12-25 DIAGNOSIS — F329 Major depressive disorder, single episode, unspecified: Secondary | ICD-10-CM

## 2020-12-25 DIAGNOSIS — F32A Depression, unspecified: Secondary | ICD-10-CM

## 2020-12-27 ENCOUNTER — Other Ambulatory Visit: Payer: Self-pay

## 2020-12-27 ENCOUNTER — Encounter (INDEPENDENT_AMBULATORY_CARE_PROVIDER_SITE_OTHER): Payer: Self-pay | Admitting: Family Medicine

## 2020-12-27 ENCOUNTER — Ambulatory Visit (INDEPENDENT_AMBULATORY_CARE_PROVIDER_SITE_OTHER): Payer: BC Managed Care – PPO | Admitting: Family Medicine

## 2020-12-27 VITALS — BP 131/84 | HR 72 | Temp 98.0°F | Ht 68.0 in | Wt 369.0 lb

## 2020-12-27 DIAGNOSIS — E8881 Metabolic syndrome: Secondary | ICD-10-CM | POA: Diagnosis not present

## 2020-12-27 DIAGNOSIS — Z9189 Other specified personal risk factors, not elsewhere classified: Secondary | ICD-10-CM

## 2020-12-27 DIAGNOSIS — Z6841 Body Mass Index (BMI) 40.0 and over, adult: Secondary | ICD-10-CM

## 2020-12-27 DIAGNOSIS — J309 Allergic rhinitis, unspecified: Secondary | ICD-10-CM | POA: Diagnosis not present

## 2020-12-27 NOTE — Progress Notes (Signed)
Chief Complaint:   OBESITY Angela Johnson is here to discuss her progress with her obesity treatment plan along with follow-up of her obesity related diagnoses. Angela Johnson is on keeping a food journal and adhering to recommended goals of 1600-2000 calories and 110 grams of protein daily and states she is following her eating plan approximately 90% of the time. Angela Johnson states she is walking for 30 minutes 7 times per week.  Today's visit was #: 3 Starting weight: 384 lbs Starting date: 11/14/2020 Today's weight: 369 lbs Today's date: 12/27/2020 Total lbs lost to date: 15 Total lbs lost since last in-office visit: 5  Interim History: Angela Johnson continues to work on weight loss. Her hunger is controlled and she is doing well with eating most of her food. She is doing protein drinks for breakfast sometimes.  Subjective:   1. Insulin resistance Angela Johnson continues to do well with diet and exercise, and her hunger was controlled for the most part. She is exercising regularly.  2. Allergic rhinitis, unspecified seasonality, unspecified trigger Angela Johnson is on Flonase and Zyrte, and she is doing well.  3. At risk for diabetes mellitus Angela Johnson is at higher than average risk for developing diabetes due to obesity.   Assessment/Plan:   1. Insulin resistance Angela Johnson will continue diet and exercise, and will continues to monitor. We may consider metformin if polyphagia worsens. Angela Johnson agreed to follow-up with [place] as directed to closely monitor her progress.  2. Allergic rhinitis, unspecified seasonality, unspecified trigger Angela Johnson will continue her medications and she was advised to be treated sooner versus later if her symptoms worsens to help avoid the need for steroids to decrease her symptoms.   3. At risk for diabetes mellitus Angela Johnson was given approximately 15 minutes of diabetes education and counseling today. We discussed intensive lifestyle modifications today with an emphasis on weight loss as well as increasing  exercise and decreasing simple carbohydrates in her diet. We also reviewed medication options with an emphasis on risk versus benefit of those discussed.   Repetitive spaced learning was employed today to elicit superior memory formation and behavioral change.  4. Obesity with current BMI of 56.1 Angela Johnson is currently in the action stage of change. As such, her goal is to continue with weight loss efforts. She has agreed to keeping a food journal and adhering to recommended goals of 1500-2000 calories and 110 grams of protein daily.   Exercise goals: As is.  Behavioral modification strategies: increasing lean protein intake and meal planning and cooking strategies.  Angela Johnson has agreed to follow-up with our clinic in 2 weeks. She was informed of the importance of frequent follow-up visits to maximize her success with intensive lifestyle modifications for her multiple health conditions.   Objective:   Blood pressure 131/84, pulse 72, temperature 98 F (36.7 C), height 5\' 8"  (1.727 m), weight (!) 369 lb (167.4 kg), SpO2 97 %. Body mass index is 56.11 kg/m.  General: Cooperative, alert, well developed, in no acute distress. HEENT: Conjunctivae and lids unremarkable. Cardiovascular: Regular rhythm.  Lungs: Normal work of breathing. Neurologic: No focal deficits.   Lab Results  Component Value Date   CREATININE 0.69 11/14/2020   BUN 12 11/14/2020   NA 137 11/14/2020   K 4.4 11/14/2020   CL 100 11/14/2020   CO2 20 11/14/2020   Lab Results  Component Value Date   ALT 10 11/14/2020   AST 13 11/14/2020   ALKPHOS 77 11/14/2020   BILITOT <0.2 11/14/2020   Lab Results  Component Value Date   HGBA1C 5.5 11/14/2020   HGBA1C 5.2 11/15/2019   HGBA1C 5.3 03/14/2019   HGBA1C 5.0 06/15/2017   Lab Results  Component Value Date   INSULIN 27.7 (H) 11/14/2020   INSULIN 158.9 (H) 06/15/2017   Lab Results  Component Value Date   TSH 6.720 (H) 11/14/2020   Lab Results  Component Value Date    CHOL 230 (H) 11/14/2020   HDL 43 11/14/2020   LDLCALC 136 (H) 11/14/2020   LDLDIRECT 112 (H) 11/15/2019   TRIG 286 (H) 11/14/2020   CHOLHDL 5.5 (H) 03/14/2019   Lab Results  Component Value Date   VD25OH 21.4 (L) 11/14/2020   Lab Results  Component Value Date   WBC 7.7 11/14/2020   HGB 11.8 11/14/2020   HCT 36.9 11/14/2020   MCV 91 11/14/2020   PLT 214 03/14/2019   No results found for: IRON, TIBC, FERRITIN  Attestation Statements:   Reviewed by clinician on day of visit: allergies, medications, problem list, medical history, surgical history, family history, social history, and previous encounter notes.   I, Burt Knack, am acting as transcriptionist for Quillian Quince, MD.  I have reviewed the above documentation for accuracy and completeness, and I agree with the above. -  Quillian Quince, MD

## 2021-01-14 ENCOUNTER — Ambulatory Visit (INDEPENDENT_AMBULATORY_CARE_PROVIDER_SITE_OTHER): Payer: BC Managed Care – PPO | Admitting: Family Medicine

## 2021-01-15 ENCOUNTER — Other Ambulatory Visit: Payer: Self-pay

## 2021-01-15 ENCOUNTER — Encounter (INDEPENDENT_AMBULATORY_CARE_PROVIDER_SITE_OTHER): Payer: Self-pay | Admitting: Family Medicine

## 2021-01-15 ENCOUNTER — Ambulatory Visit (INDEPENDENT_AMBULATORY_CARE_PROVIDER_SITE_OTHER): Payer: BC Managed Care – PPO | Admitting: Family Medicine

## 2021-01-15 VITALS — BP 127/73 | HR 74 | Temp 98.2°F | Ht 68.0 in | Wt 369.0 lb

## 2021-01-15 DIAGNOSIS — Z6841 Body Mass Index (BMI) 40.0 and over, adult: Secondary | ICD-10-CM

## 2021-01-15 DIAGNOSIS — E559 Vitamin D deficiency, unspecified: Secondary | ICD-10-CM | POA: Diagnosis not present

## 2021-01-15 DIAGNOSIS — M25571 Pain in right ankle and joints of right foot: Secondary | ICD-10-CM | POA: Diagnosis not present

## 2021-01-15 NOTE — Progress Notes (Signed)
Chief Complaint:   OBESITY Angela Johnson is here to discuss her progress with her obesity treatment plan along with follow-up of her obesity related diagnoses. Angela Johnson is on keeping a food journal and adhering to recommended goals of 1500-2000 calories and 110 grams of protein daily and states she is following her eating plan approximately 50% of the time. Angela Johnson states she is doing cardio for 30 minutes 5 times per week.  Today's visit was #: 4 Starting weight: 384 lbs Starting date: 11/14/2020 Today's weight: 369 lbs Today's date: 01/15/2021 Total lbs lost to date: 15 Total lbs lost since last in-office visit: 0  Interim History: Angela Johnson has done well maintaining her weight even on vacation. She is working on increasing her protein in her diet. She is increasing her walking but she has significant bilateral shin pain.  Subjective:   1. Vitamin D deficiency Angela Johnson is stable on Vit D, but her level is not yet at goal.   2. Joint pain of ankle and foot, right Angela Johnson notes significant bilateral supination and shin pain with walking. She has not had an evaluation by Podiatry, and she is not using arch supports.  Assessment/Plan:   1. Vitamin D deficiency Low Vitamin D level contributes to fatigue and are associated with obesity, breast, and colon cancer. Angela Johnson will continue prescription Vitamin D 50,000 IU every week, and we will plan to recheck labs in 1 month. She will follow-up for routine testing of Vitamin D, at least 2-3 times per year to avoid over-replacement.  2. Joint pain of ankle and foot, right We will refer Angela Johnson to Dr. Ardelle Anton at [facility] for evaluation.   - Ambulatory referral to Sports Medicine  3. Obesity with current BMI of 56.2 Angela Johnson is currently in the action stage of change. As such, her goal is to continue with weight loss efforts. She has agreed to keeping a food journal and adhering to recommended goals of 1500-2000 calories and 110 grams of protein daily.    Exercise goals: As is.  Behavioral modification strategies: increasing lean protein intake and meal planning and cooking strategies.  Angela Johnson has agreed to follow-up with our clinic in 2 to 3 weeks. She was informed of the importance of frequent follow-up visits to maximize her success with intensive lifestyle modifications for her multiple health conditions.   Objective:   Blood pressure 127/73, pulse 74, temperature 98.2 F (36.8 C), height 5\' 8"  (1.727 m), weight (!) 369 lb (167.4 kg), SpO2 97 %. Body mass index is 56.11 kg/m.  General: Cooperative, alert, well developed, in no acute distress. HEENT: Conjunctivae and lids unremarkable. Cardiovascular: Regular rhythm.  Lungs: Normal work of breathing. Neurologic: No focal deficits.   Lab Results  Component Value Date   CREATININE 0.69 11/14/2020   BUN 12 11/14/2020   NA 137 11/14/2020   K 4.4 11/14/2020   CL 100 11/14/2020   CO2 20 11/14/2020   Lab Results  Component Value Date   ALT 10 11/14/2020   AST 13 11/14/2020   ALKPHOS 77 11/14/2020   BILITOT <0.2 11/14/2020   Lab Results  Component Value Date   HGBA1C 5.5 11/14/2020   HGBA1C 5.2 11/15/2019   HGBA1C 5.3 03/14/2019   HGBA1C 5.0 06/15/2017   Lab Results  Component Value Date   INSULIN 27.7 (H) 11/14/2020   INSULIN 158.9 (H) 06/15/2017   Lab Results  Component Value Date   TSH 6.720 (H) 11/14/2020   Lab Results  Component Value  Date   CHOL 230 (H) 11/14/2020   HDL 43 11/14/2020   LDLCALC 136 (H) 11/14/2020   LDLDIRECT 112 (H) 11/15/2019   TRIG 286 (H) 11/14/2020   CHOLHDL 5.5 (H) 03/14/2019   Lab Results  Component Value Date   VD25OH 21.4 (L) 11/14/2020   Lab Results  Component Value Date   WBC 7.7 11/14/2020   HGB 11.8 11/14/2020   HCT 36.9 11/14/2020   MCV 91 11/14/2020   PLT 214 03/14/2019   No results found for: IRON, TIBC, FERRITIN  Attestation Statements:   Reviewed by clinician on day of visit: allergies, medications,  problem list, medical history, surgical history, family history, social history, and previous encounter notes.  Time spent on visit including pre-visit chart review and post-visit care and charting was 30 minutes.    I, Burt Knack, am acting as transcriptionist for Quillian Quince, MD.  I have reviewed the above documentation for accuracy and completeness, and I agree with the above. -  Quillian Quince, MD

## 2021-01-22 ENCOUNTER — Other Ambulatory Visit: Payer: Self-pay | Admitting: Family Medicine

## 2021-01-22 DIAGNOSIS — F32A Depression, unspecified: Secondary | ICD-10-CM

## 2021-01-28 ENCOUNTER — Encounter (INDEPENDENT_AMBULATORY_CARE_PROVIDER_SITE_OTHER): Payer: Self-pay

## 2021-01-29 ENCOUNTER — Ambulatory Visit (INDEPENDENT_AMBULATORY_CARE_PROVIDER_SITE_OTHER): Payer: BC Managed Care – PPO | Admitting: Family Medicine

## 2021-02-13 ENCOUNTER — Ambulatory Visit (INDEPENDENT_AMBULATORY_CARE_PROVIDER_SITE_OTHER): Payer: BC Managed Care – PPO | Admitting: Family Medicine

## 2021-02-13 ENCOUNTER — Encounter (INDEPENDENT_AMBULATORY_CARE_PROVIDER_SITE_OTHER): Payer: Self-pay

## 2021-02-26 ENCOUNTER — Other Ambulatory Visit: Payer: Self-pay | Admitting: Family Medicine

## 2021-02-26 DIAGNOSIS — F32A Depression, unspecified: Secondary | ICD-10-CM

## 2021-04-01 ENCOUNTER — Other Ambulatory Visit: Payer: Self-pay | Admitting: Family Medicine

## 2021-04-01 DIAGNOSIS — F32A Depression, unspecified: Secondary | ICD-10-CM

## 2021-04-01 DIAGNOSIS — E063 Autoimmune thyroiditis: Secondary | ICD-10-CM

## 2021-04-11 ENCOUNTER — Ambulatory Visit (INDEPENDENT_AMBULATORY_CARE_PROVIDER_SITE_OTHER): Payer: BC Managed Care – PPO | Admitting: Family Medicine

## 2021-04-11 ENCOUNTER — Encounter: Payer: Self-pay | Admitting: Family Medicine

## 2021-04-11 VITALS — BP 141/84 | HR 94 | Temp 98.9°F | Ht 68.0 in | Wt 380.0 lb

## 2021-04-11 DIAGNOSIS — R6889 Other general symptoms and signs: Secondary | ICD-10-CM | POA: Diagnosis not present

## 2021-04-11 LAB — VERITOR FLU A/B WAIVED
Influenza A: NEGATIVE
Influenza B: NEGATIVE

## 2021-04-11 NOTE — Progress Notes (Signed)
Acute Office Visit  Subjective:    Patient ID: Angela Johnson, female    DOB: Mar 13, 1986, 36 y.o.   MRN: [id number]  Chief Complaint  Patient presents with   cold/flu like symptoms    Congestion, post nasal drip, sore throat, fatigue, headache, earache    Pt presents with headache, sore throat, body aches, R ear pain, and fatigue. She denies chills but states she felt febrile overnight. She denies recent contact to COVID or flu. She has been using ibuprofen and tylenol with mild relief. She denies loss of taste or smell.   Patient is in today for URI symptoms  Past Medical History:  Diagnosis Date   Alcohol abuse    Anxiety    Arthritis    Back pain    Depression    Drug abuse (HCC)    Edema, lower extremity    GERD (gastroesophageal reflux disease)    Gonorrhea 03/31/2005   Treated   Herniated disc    Hypertension    Hypothyroidism    IBS (irritable bowel syndrome)    Irritable bowel syndrome (IBS)    Other hyperlipidemia    Palpitations    Pre-diabetes    SOB (shortness of breath)    Vitamin D deficiency     Past Surgical History:  Procedure Laterality Date   oral     wisdom teeth    Family History  Problem Relation Age of Onset   Diabetes Mother    Hypertension Mother    Hyperlipidemia Mother    Depression Mother    Anxiety disorder Mother    Anxiety disorder Father    Depression Father    Hyperlipidemia Father    Thyroid disease Father    Sleep apnea Father     Social History   Socioeconomic History   Marital status: Married    Spouse name: Not on file   Number of children: Not on file   Years of education: Not on file   Highest education level: Not on file  Occupational History   Occupation: Customer service/ Counsellor  Tobacco Use   Smoking status: Former    Types: Cigarettes    Quit date: 03/31/2005    Years since quitting: 16.0   Smokeless tobacco: Never  Vaping Use   Vaping Use: Never used  Substance and Sexual Activity    Alcohol use: No   Drug use: No    Types: "Crack" cocaine, Marijuana   Sexual activity: Yes  Other Topics Concern   Not on file  Social History Narrative   Not on file   Social Determinants of Health   Financial Resource Strain: Not on file  Food Insecurity: Not on file  Transportation Needs: Not on file  Physical Activity: Not on file  Stress: Not on file  Social Connections: Not on file  Intimate Partner Violence: Not on file    Outpatient Medications Prior to Visit  Medication Sig Dispense Refill   buPROPion (WELLBUTRIN XL) 150 MG 24 hr tablet TAKE ONE TABLET ONCE DAILY 30 tablet 0   cetirizine (ZYRTEC) 10 MG tablet Take 10 mg by mouth daily. allergies      citalopram (CELEXA) 40 MG tablet TAKE 1 TABLET DAILY 90 tablet 0   fluticasone (FLONASE) 50 MCG/ACT nasal spray Place 2 sprays into both nostrils daily. (Patient taking differently: Place 2 sprays into both nostrils as needed.) 16 g 6   levothyroxine (SYNTHROID) 100 MCG tablet TAKE ONE TABLET ONCE DAILY 90 tablet 0  norgestrel-ethinyl estradiol (LO/OVRAL,CRYSELLE) 0.3-30 MG-MCG tablet Take 1 tablet by mouth daily.     Vitamin D, Ergocalciferol, (DRISDOL) 1.25 MG (50000 UNIT) CAPS capsule TAKE 1 CAPSULE ONCE A WEEK     No facility-administered medications prior to visit.    Allergies  Allergen Reactions   Ciprofloxacin Hcl Itching   Latex    Zithromax [Azithromycin]     dizziness    Review of Systems  Constitutional:  Positive for chills and fatigue.  HENT:  Positive for congestion, ear pain and sore throat. Negative for trouble swallowing.   Respiratory:  Negative for cough and shortness of breath.   Cardiovascular:  Negative for chest pain.  Gastrointestinal:  Negative for diarrhea, nausea and vomiting.  Neurological:  Positive for headaches. Negative for dizziness, seizures, light-headedness and numbness.  All other systems reviewed and are negative.     Objective:    Physical Exam Constitutional:       Appearance: Normal appearance. She is obese.  HENT:     Head: Normocephalic and atraumatic.     Right Ear: Hearing normal. A middle ear effusion is present.     Left Ear: Hearing normal.     Nose: Congestion present.  Eyes:     Extraocular Movements: Extraocular movements intact.     Conjunctiva/sclera: Conjunctivae normal.     Pupils: Pupils are equal, round, and reactive to light.  Cardiovascular:     Rate and Rhythm: Normal rate and regular rhythm.  Pulmonary:     Effort: Pulmonary effort is normal.     Breath sounds: Normal breath sounds and air entry.  Musculoskeletal:     Cervical back: Normal range of motion.  Skin:    General: Skin is warm and dry.     Capillary Refill: Capillary refill takes less than 2 seconds.  Neurological:     General: No focal deficit present.     Mental Status: She is alert and oriented to person, place, and time.  Psychiatric:        Mood and Affect: Mood normal.        Behavior: Behavior normal. Behavior is cooperative.    Ht 5\' 8"  (1.727 m)    Wt (!) 380 lb (172.4 kg)    BMI 57.78 kg/m  Wt Readings from Last 3 Encounters:  04/11/21 (!) 380 lb (172.4 kg)  01/15/21 (!) 369 lb (167.4 kg)  12/27/20 (!) 369 lb (167.4 kg)    Health Maintenance Due  Topic Date Due   COVID-19 Vaccine (1) Never done   PAP SMEAR-Modifier  10/27/2020   INFLUENZA VACCINE  10/29/2020    There are no preventive care reminders to display for this patient.   Lab Results  Component Value Date   TSH 6.720 (H) 11/14/2020   Lab Results  Component Value Date   WBC 7.7 11/14/2020   HGB 11.8 11/14/2020   HCT 36.9 11/14/2020   MCV 91 11/14/2020   PLT 214 03/14/2019   Lab Results  Component Value Date   NA 137 11/14/2020   K 4.4 11/14/2020   CO2 20 11/14/2020   GLUCOSE 96 11/14/2020   BUN 12 11/14/2020   CREATININE 0.69 11/14/2020   BILITOT <0.2 11/14/2020   ALKPHOS 77 11/14/2020   AST 13 11/14/2020   ALT 10 11/14/2020   PROT 7.1 11/14/2020   ALBUMIN  4.3 11/14/2020   CALCIUM 9.0 11/14/2020   EGFR 116 11/14/2020   Lab Results  Component Value Date   CHOL 230 (H) 11/14/2020  Lab Results  Component Value Date   HDL 43 11/14/2020   Lab Results  Component Value Date   LDLCALC 136 (H) 11/14/2020   Lab Results  Component Value Date   TRIG 286 (H) 11/14/2020   Lab Results  Component Value Date   CHOLHDL 5.5 (H) 03/14/2019   Lab Results  Component Value Date   HGBA1C 5.5 11/14/2020       Assessment & Plan:   Angela Johnson was seen today for cold/flu like symptoms.  Diagnoses and all orders for this visit:  Flu-like symptoms -     Veritor Flu A/B Waived -     Novel Coronavirus, NAA ([facility]) -      Discussed negative Flu test with patient. Encouraged mucinex, Flonase, hydration, and alternating tylenol and ibuprofen. Further treatment pending results.   The above assessment and management plan was discussed with the patient. The patient verbalized understanding of and has agreed to the management plan. Patient is aware to call the clinic if they develop any new symptoms or if symptoms fail to improve or worsen. Patient is aware when to return to the clinic for a follow-up visit. Patient educated on when it is appropriate to go to the emergency department.        Monia Pouch, FNP

## 2021-04-11 NOTE — Patient Instructions (Signed)
It appears that you have a viral upper respiratory infection (cold).  Cold symptoms can last up to 2 weeks.   ° °- Get plenty of rest and drink plenty of fluids. °- Try to breathe moist air. Use a cold mist humidifier. °- Consume warm fluids (soup or tea) to provide relief for a stuffy nose and to loosen phlegm. °- For cough and congestion you can use plain Mucinex, regular or max strength, follow box directions.  °- For nasal stuffiness, try saline nasal spray or a Neti Pot. You can use saline nasal spray 4 times daily. Do not use tap water in the Neti Pot, follow instructions on box for proper use. Afrin nasal spray can also be used but this product should not be used longer than 3 days or it will cause rebound nasal stuffiness (worsening nasal congestion). °- For sore throat pain relief: use chloraseptic spray, suck on throat lozenges, hard candy or popsicles; gargle with warm salt water (1/4 tsp. salt per 8 oz. of water); and eat soft, bland foods. °- For fever or aches and pains take tylenol or motrin as appropriate for age and weight.  °- Eat a well-balanced diet. If you cannot, ensure you are getting enough nutrients by taking a daily multivitamin. °- Avoid dairy products, as they can thicken phlegm. °- Avoid alcohol, as it impairs your body’s immune system. °- Change your toothbrush in 3 days.  ° °CONTACT YOUR DOCTOR IF YOU EXPERIENCE ANY OF THE FOLLOWING: °- High fever °- Ear pain °- Sinus-type headache °- Unusually severe cold symptoms °- Cough that gets worse while other cold symptoms improve °- Flare up of any chronic lung problem, such as asthma or COPD °- Your symptoms persist longer than 2 weeks ° °

## 2021-04-12 LAB — SARS-COV-2, NAA 2 DAY TAT

## 2021-04-12 LAB — NOVEL CORONAVIRUS, NAA: SARS-CoV-2, NAA: NOT DETECTED

## 2021-05-03 ENCOUNTER — Other Ambulatory Visit: Payer: Self-pay | Admitting: Family Medicine

## 2021-05-03 DIAGNOSIS — F32A Depression, unspecified: Secondary | ICD-10-CM

## 2021-05-03 DIAGNOSIS — E063 Autoimmune thyroiditis: Secondary | ICD-10-CM

## 2021-06-17 ENCOUNTER — Encounter: Payer: Self-pay | Admitting: Family Medicine

## 2021-08-02 ENCOUNTER — Ambulatory Visit (INDEPENDENT_AMBULATORY_CARE_PROVIDER_SITE_OTHER): Payer: BC Managed Care – PPO | Admitting: Family Medicine

## 2021-08-02 ENCOUNTER — Encounter: Payer: Self-pay | Admitting: Family Medicine

## 2021-08-02 VITALS — BP 138/81 | HR 92 | Temp 98.1°F | Ht 68.0 in | Wt 394.6 lb

## 2021-08-02 DIAGNOSIS — Z0000 Encounter for general adult medical examination without abnormal findings: Secondary | ICD-10-CM

## 2021-08-02 DIAGNOSIS — E781 Pure hyperglyceridemia: Secondary | ICD-10-CM

## 2021-08-02 DIAGNOSIS — E538 Deficiency of other specified B group vitamins: Secondary | ICD-10-CM

## 2021-08-02 DIAGNOSIS — G4710 Hypersomnia, unspecified: Secondary | ICD-10-CM

## 2021-08-02 DIAGNOSIS — Z0001 Encounter for general adult medical examination with abnormal findings: Secondary | ICD-10-CM | POA: Diagnosis not present

## 2021-08-02 DIAGNOSIS — E063 Autoimmune thyroiditis: Secondary | ICD-10-CM

## 2021-08-02 DIAGNOSIS — F32A Depression, unspecified: Secondary | ICD-10-CM

## 2021-08-02 DIAGNOSIS — I10 Essential (primary) hypertension: Secondary | ICD-10-CM

## 2021-08-02 DIAGNOSIS — R739 Hyperglycemia, unspecified: Secondary | ICD-10-CM | POA: Diagnosis not present

## 2021-08-02 DIAGNOSIS — Z6841 Body Mass Index (BMI) 40.0 and over, adult: Secondary | ICD-10-CM

## 2021-08-02 DIAGNOSIS — E8881 Metabolic syndrome: Secondary | ICD-10-CM

## 2021-08-02 MED ORDER — BUPROPION HCL ER (XL) 300 MG PO TB24: 300.0000 mg | ORAL_TABLET | Freq: Every day | ORAL | 3 refills | Status: DC

## 2021-08-02 MED ORDER — OZEMPIC (0.25 OR 0.5 MG/DOSE) 2 MG/1.5ML ~~LOC~~ SOPN: PEN_INJECTOR | SUBCUTANEOUS | 0 refills | Status: AC

## 2021-08-02 NOTE — Patient Instructions (Signed)
Come in for fasting labs ?Preventive Care 36-36 Years Old, Female ?Preventive care refers to lifestyle choices and visits with your health care provider that can promote health and wellness. Preventive care visits are also called wellness exams. ?What can I expect for my preventive care visit? ?Counseling ?During your preventive care visit, your health care provider may ask about your: ?Medical history, including: ?Past medical problems. ?Family medical history. ?Pregnancy history. ?Current health, including: ?Menstrual cycle. ?Method of birth control. ?Emotional well-being. ?Home life and relationship well-being. ?Sexual activity and sexual health. ?Lifestyle, including: ?Alcohol, nicotine or tobacco, and drug use. ?Access to firearms. ?Diet, exercise, and sleep habits. ?Work and work Statistician. ?Sunscreen use. ?Safety issues such as seatbelt and bike helmet use. ?Physical exam ?Your health care provider may check your: ?Height and weight. These may be used to calculate your BMI (body mass index). BMI is a measurement that tells if you are at a healthy weight. ?Waist circumference. This measures the distance around your waistline. This measurement also tells if you are at a healthy weight and may help predict your risk of certain diseases, such as type 2 diabetes and high blood pressure. ?Heart rate and blood pressure. ?Body temperature. ?Skin for abnormal spots. ?What immunizations do I need? ? ?Vaccines are usually given at various ages, according to a schedule. Your health care provider will recommend vaccines for you based on your age, medical history, and lifestyle or other factors, such as travel or where you work. ?What tests do I need? ?Screening ?Your health care provider may recommend screening tests for certain conditions. This may include: ?Pelvic exam and Pap test. ?Lipid and cholesterol levels. ?Diabetes screening. This is done by checking your blood sugar (glucose) after you have not eaten for a  while (fasting). ?Hepatitis B test. ?Hepatitis C test. ?HIV (human immunodeficiency virus) test. ?STI (sexually transmitted infection) testing, if you are at risk. ?BRCA-related cancer screening. This may be done if you have a family history of breast, ovarian, tubal, or peritoneal cancers. ?Talk with your health care provider about your test results, treatment options, and if necessary, the need for more tests. ?Follow these instructions at home: ?Eating and drinking ? ?Eat a healthy diet that includes fresh fruits and vegetables, whole grains, lean protein, and low-fat dairy products. ?Take vitamin and mineral supplements as recommended by your health care provider. ?Do not drink alcohol if: ?Your health care provider tells you not to drink. ?You are pregnant, may be pregnant, or are planning to become pregnant. ?If you drink alcohol: ?Limit how much you have to 0-1 drink a day. ?Know how much alcohol is in your drink. In the [place] one drink equals one 12 oz bottle of beer (355 mL), one 5 oz glass of wine (148 mL), or one 1? oz glass of hard liquor (44 mL). ?Lifestyle ?Brush your teeth every morning and night with fluoride toothpaste. Floss one time each day. ?Exercise for at least 30 minutes 5 or more days each week. ?Do not use any products that contain nicotine or tobacco. These products include cigarettes, chewing tobacco, and vaping devices, such as e-cigarettes. If you need help quitting, ask your health care provider. ?Do not use drugs. ?If you are sexually active, practice safe sex. Use a condom or other form of protection to prevent STIs. ?If you do not wish to become pregnant, use a form of birth control. If you plan to become pregnant, see your health care provider for a prepregnancy visit. ?Find healthy ways to  manage stress, such as: ?Meditation, yoga, or listening to music. ?Journaling. ?Talking to a trusted person. ?Spending time with friends and family. ?Minimize exposure to UV radiation to reduce  your risk of skin cancer. ?Safety ?Always wear your seat belt while driving or riding in a vehicle. ?Do not drive: ?If you have been drinking alcohol. Do not ride with someone who has been drinking. ?If you have been using any mind-altering substances or drugs. ?While texting. ?When you are tired or distracted. ?Wear a helmet and other protective equipment during sports activities. ?If you have firearms in your house, make sure you follow all gun safety procedures. ?Seek help if you have been physically or sexually abused. ?What's next? ?Go to your health care provider once a year for an annual wellness visit. ?Ask your health care provider how often you should have your eyes and teeth checked. ?Stay up to date on all vaccines. ?This information is not intended to replace advice given to you by your health care provider. Make sure you discuss any questions you have with your health care provider. ?Document Revised: 09/12/2020 Document Reviewed: 09/12/2020 ?Elsevier Patient Education ? [place]. ? ?

## 2021-08-02 NOTE — Progress Notes (Signed)
? ?Angela Johnson is a 36 y.o. female presents to office today for annual physical exam examination.   ? ?Concerns today include: ?1.  Morbid obesity ?Patient with BMI over 60 now.  She has progressively gained weight over the last couple of years.  Her weight at high school graduation was 160 pounds.  She is currently at her max weight of 394.6 pounds.  She reports chronic fatigue, joint pain, depression, signs and symptoms of sleep apnea.  She reports excessive daytime sedation.  She is never been assessed for sleep apnea but had been referred previously unfortunately did not make that appointment.  She takes oral birth control for contraception but admits that she feels so poorly about her body and is so depressed that she really has not been intimate with her husband in quite some time.  She is to the point where she would like to see a Ambulance person.  She would also like another referral back to sleep medicine for evaluation of obstructive sleep apnea.  We have tried obtaining Wegovy or Saxenda but unfortunately her insurance did not cover.  She is been on Topamax previously and attempts to help with weight which did help some.  She is currently treated with Wellbutrin but she feels that this needs to be raised due to depression.  She has been on various over-the-counter products in efforts to lose weight but this has not been helpful nor maintainable.  She has been to healthy weight and wellness and lost about 30 pounds but again this was not sustainable for her. ? 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 Influenza,inj,Quad PF,6+ Mos 02/22/2018, 03/14/2019  ? Tdap 08/28/2011  ? ?Past Medical History:  ?Diagnosis Date  ? Alcohol abuse   ? Anxiety   ? Arthritis   ? Back  pain   ? Depression   ? Drug abuse ([place])   ? Edema, lower extremity   ? GERD (gastroesophageal reflux disease)   ? Gonorrhea 03/31/2005  ? Treated  ? Herniated disc   ? Hypertension   ? Hypothyroidism   ? IBS (irritable bowel syndrome)   ? Irritable bowel syndrome (IBS)   ? Other hyperlipidemia   ? Palpitations   ? Pre-diabetes   ? SOB (shortness of breath)   ? Vitamin D deficiency   ? ?Social History  ? ?Socioeconomic History  ? Marital status: Married  ?  Spouse name: Not on file  ? Number of children: Not on file  ? Years of education: Not on file  ? Highest education level: Not on file  ?Occupational History  ? Occupation: Environmental consultant  ?Tobacco Use  ? Smoking status: Former  ?  Types: Cigarettes  ?  Quit date: 03/31/2005  ?  Years since quitting: 16.3  ? Smokeless tobacco: Never  ?Vaping Use  ? Vaping Use: Never used  ?Substance and Sexual Activity  ? Alcohol use: No  ? Drug use: No  ?  Types: "Crack" cocaine, Marijuana  ? Sexual activity: Yes  ?Other Topics Concern  ? Not on file  ?Social History Narrative  ? Not on file  ? ?Social Determinants of Health  ? ?Financial Resource Strain: Not on file  ?Food Insecurity: Not on file  ?Transportation Needs: Not on file  ?Physical Activity: Not  on file  ?Stress: Not on file  ?Social Connections: Not on file  ?Intimate Partner Violence: Not on file  ? ?Past Surgical History:  ?Procedure Laterality Date  ? oral    ? wisdom teeth  ? ?Family History  ?Problem Relation Age of Onset  ? Diabetes Mother   ? Hypertension Mother   ? Hyperlipidemia Mother   ? Depression Mother   ? Anxiety disorder Mother   ? Anxiety disorder Father   ? Depression Father   ? Hyperlipidemia Father   ? Thyroid disease Father   ? Sleep apnea Father   ? ? ?Current Outpatient Medications:  ?  buPROPion (WELLBUTRIN XL) 150 MG 24 hr tablet, Take 1 tablet (150 mg total) by mouth daily. (NEEDS TO BE SEEN BEFORE NEXT REFILL), Disp: 30 tablet, Rfl: 0 ?  cetirizine (ZYRTEC) 10 MG  tablet, Take 10 mg by mouth daily. allergies , Disp: , Rfl:  ?  citalopram (CELEXA) 40 MG tablet, TAKE 1 TABLET DAILY, Disp: 90 tablet, Rfl: 0 ?  fluticasone (FLONASE) 50 MCG/ACT nasal spray, Place 2 sprays into both nostrils daily. (Patient taking differently: Place 2 sprays into both nostrils as needed.), Disp: 16 g, Rfl: 6 ?  levothyroxine (SYNTHROID) 100 MCG tablet, TAKE ONE TABLET ONCE DAILY, Disp: 90 tablet, Rfl: 0 ?  norgestrel-ethinyl estradiol (LO/OVRAL,CRYSELLE) 0.3-30 MG-MCG tablet, Take 1 tablet by mouth daily., Disp: , Rfl:  ? ?Allergies  ?Allergen Reactions  ? Ciprofloxacin Hcl Itching  ? Latex   ? Zithromax [Azithromycin]   ?  dizziness  ?  ? ?ROS: ?Review of Systems ?Pertinent items noted in HPI and remainder of comprehensive ROS otherwise negative.   ? ?Physical exam ?BP 138/81   Pulse 92   Temp 98.1 ?F (36.7 ?C)   Ht $R'5\' 8"'wW$  (1.727 m)   Wt (!) 394 lb 9.6 oz (179 kg)   SpO2 96%   BMI 60.00 kg/m?  ?General appearance: alert, cooperative, appears stated age, and morbidly obese ?Head: Normocephalic, without obvious abnormality, atraumatic ?Eyes: negative findings: lids and lashes normal, conjunctivae and sclerae normal, corneas clear, and pupils equal, round, reactive to light and accomodation ?Ears: normal TM's and external ear canals both ears ?Nose: Nares normal. Septum midline. Mucosa normal. No drainage or sinus tenderness. ?Throat: lips, mucosa, and tongue normal; teeth and gums normal ?Neck: no adenopathy, no carotid bruit, supple, symmetrical, trachea midline, and thyroid not enlarged, symmetric, no tenderness/mass/nodules; neck girth is enlarged ?Back: symmetric, no curvature. ROM normal. No CVA tenderness. ?Lungs: clear to auscultation bilaterally ?Heart: regular rate and rhythm, S1, S2 normal, no murmur, click, rub or gallop ?Abdomen:  Morbidly obese abdomen.  No palpable hepatosplenomegaly but exam is limited by body habitus.  Nontender ?Extremities: extremities normal, atraumatic, no  cyanosis or edema ?Pulses: 2+ and symmetric ?Skin: Skin color, texture, turgor normal. No rashes or lesions ?Lymph nodes: Cervical, supraclavicular, and axillary nodes normal. ?Neurologic: Grossly normal ?Psych: Depressed and intermittently tearful ? ? ?  08/02/2021  ?  2:06 PM 04/11/2021  ? 10:51 AM 11/14/2020  ?  8:33 AM  ?Depression screen PHQ 2/9  ?Decreased Interest 1 0 3  ?Down, Depressed, Hopeless 1 0 3  ?PHQ - 2 Score 2 0 6  ?Altered sleeping $RemoveBeforeDEI'3 2 2  'JMernlUoCAuUnsxn$ ?Tired, decreased energy $RemoveBeforeDE'3 2 3  'zBBcjKmAtxaltyE$ ?Change in appetite 1 0 3  ?Feeling bad or failure about yourself  1 0 3  ?Trouble concentrating 1 0 3  ?Moving slowly or fidgety/restless 0 0 1  ?Suicidal thoughts  0 0 0  ?PHQ-9 Score 11 4 21   ?Difficult doing work/chores Somewhat difficult Somewhat difficult Somewhat difficult  ? ? ?  08/02/2021  ?  2:06 PM 04/11/2021  ? 10:52 AM 09/18/2020  ?  1:49 PM  ?GAD 7 : Generalized Anxiety Score  ?Nervous, Anxious, on Edge 1 0 1  ?Control/stop worrying 0 0 0  ?Worry too much - different things 0 0 0  ?Trouble relaxing 0 0 0  ?Restless 0 0 0  ?Easily annoyed or irritable 0 0 1  ?Afraid - awful might happen 0 0 0  ?Total GAD 7 Score 1 0 2  ?Anxiety Difficulty Not difficult at all Not difficult at all Not difficult at all  ? ? ?Assessment/ Plan: ?Morley Kos Sills here for annual physical exam.  ? ?Annual physical exam ? ?Morbid obesity ([place]) - Plan: Ambulatory referral to Sleep Studies, buPROPion (WELLBUTRIN XL) 300 MG 24 hr tablet, Amb Referral to Bariatric Surgery, Semaglutide,0.25 or 0.5MG /DOS, (OZEMPIC, 0.25 OR 0.5 MG/DOSE,) 2 MG/1.5ML SOPN ? ?BMI 60.0-69.9, adult ([place]) - Plan: CMP14+EGFR, Lipid panel, Bayer DCA Hb A1c Waived, TSH, T4, free, Ambulatory referral to Sleep Studies, Amb Referral to Bariatric Surgery, Semaglutide,0.25 or 0.5MG /DOS, (OZEMPIC, 0.25 OR 0.5 MG/DOSE,) 2 MG/1.5ML SOPN ? ?Hashimoto's thyroiditis - Plan: TSH, T4, free ? ?Depressive disorder - Plan: buPROPion (WELLBUTRIN XL) 300 MG 24 hr tablet, Amb Referral to Bariatric  Surgery ? ?Hypertriglyceridemia - Plan: CMP14+EGFR, Lipid panel, TSH, Amb Referral to Bariatric Surgery, Semaglutide,0.25 or 0.5MG /DOS, (OZEMPIC, 0.25 OR 0.5 MG/DOSE,) 2 MG/1.5ML SOPN ? ?Metabolic syndrome - Plan: CM

## 2021-08-08 ENCOUNTER — Other Ambulatory Visit: Payer: BC Managed Care – PPO

## 2021-08-08 DIAGNOSIS — E8881 Metabolic syndrome: Secondary | ICD-10-CM

## 2021-08-08 DIAGNOSIS — R739 Hyperglycemia, unspecified: Secondary | ICD-10-CM | POA: Diagnosis not present

## 2021-08-08 DIAGNOSIS — E063 Autoimmune thyroiditis: Secondary | ICD-10-CM | POA: Diagnosis not present

## 2021-08-08 DIAGNOSIS — E781 Pure hyperglyceridemia: Secondary | ICD-10-CM

## 2021-08-08 DIAGNOSIS — E538 Deficiency of other specified B group vitamins: Secondary | ICD-10-CM

## 2021-08-08 DIAGNOSIS — G4710 Hypersomnia, unspecified: Secondary | ICD-10-CM

## 2021-08-08 DIAGNOSIS — Z6841 Body Mass Index (BMI) 40.0 and over, adult: Secondary | ICD-10-CM

## 2021-08-08 LAB — BAYER DCA HB A1C WAIVED: HB A1C (BAYER DCA - WAIVED): 5.4 % (ref 4.8–5.6)

## 2021-08-09 LAB — CBC
Hematocrit: 37.7 % (ref 34.0–46.6)
Hemoglobin: 12.7 g/dL (ref 11.1–15.9)
MCH: 29.7 pg (ref 26.6–33.0)
MCHC: 33.7 g/dL (ref 31.5–35.7)
MCV: 88 fL (ref 79–97)
Platelets: 207 10*3/uL (ref 150–450)
RBC: 4.28 x10E6/uL (ref 3.77–5.28)
RDW: 13.5 % (ref 11.7–15.4)
WBC: 7.1 10*3/uL (ref 3.4–10.8)

## 2021-08-09 LAB — CMP14+EGFR
ALT: 14 IU/L (ref 0–32)
AST: 16 IU/L (ref 0–40)
Albumin/Globulin Ratio: 1.5 (ref 1.2–2.2)
Albumin: 4.1 g/dL (ref 3.8–4.8)
Alkaline Phosphatase: 79 IU/L (ref 44–121)
BUN/Creatinine Ratio: 14 (ref 9–23)
BUN: 13 mg/dL (ref 6–20)
Bilirubin Total: 0.2 mg/dL (ref 0.0–1.2)
CO2: 21 mmol/L (ref 20–29)
Calcium: 9.1 mg/dL (ref 8.7–10.2)
Chloride: 101 mmol/L (ref 96–106)
Creatinine, Ser: 0.93 mg/dL (ref 0.57–1.00)
Globulin, Total: 2.8 g/dL (ref 1.5–4.5)
Glucose: 141 mg/dL — ABNORMAL HIGH (ref 70–99)
Potassium: 4.3 mmol/L (ref 3.5–5.2)
Sodium: 137 mmol/L (ref 134–144)
Total Protein: 6.9 g/dL (ref 6.0–8.5)
eGFR: 82 mL/min/{1.73_m2} (ref >59)

## 2021-08-09 LAB — T4, FREE: Free T4: 1.02 ng/dL (ref 0.82–1.77)

## 2021-08-09 LAB — LIPID PANEL
Chol/HDL Ratio: 5.7 ratio — ABNORMAL HIGH (ref 0.0–4.4)
Cholesterol, Total: 216 mg/dL — ABNORMAL HIGH (ref 100–199)
HDL: 38 mg/dL — ABNORMAL LOW (ref >39)
LDL Chol Calc (NIH): 127 mg/dL — ABNORMAL HIGH (ref 0–99)
Triglycerides: 284 mg/dL — ABNORMAL HIGH (ref 0–149)
VLDL Cholesterol Cal: 51 mg/dL — ABNORMAL HIGH (ref 5–40)

## 2021-08-09 LAB — VITAMIN B12: Vitamin B-12: 256 pg/mL (ref 232–1245)

## 2021-08-09 LAB — TSH: TSH: 4.32 u[IU]/mL (ref 0.450–4.500)

## 2021-08-12 ENCOUNTER — Encounter: Payer: Self-pay | Admitting: Emergency Medicine

## 2021-08-28 ENCOUNTER — Encounter: Payer: Self-pay | Admitting: Neurology

## 2021-08-28 ENCOUNTER — Ambulatory Visit (INDEPENDENT_AMBULATORY_CARE_PROVIDER_SITE_OTHER): Payer: BC Managed Care – PPO | Admitting: Neurology

## 2021-08-28 VITALS — BP 133/81 | HR 80 | Ht 70.0 in | Wt 396.0 lb

## 2021-08-28 DIAGNOSIS — R519 Headache, unspecified: Secondary | ICD-10-CM

## 2021-08-28 DIAGNOSIS — Z9189 Other specified personal risk factors, not elsewhere classified: Secondary | ICD-10-CM

## 2021-08-28 DIAGNOSIS — R0683 Snoring: Secondary | ICD-10-CM

## 2021-08-28 DIAGNOSIS — R0681 Apnea, not elsewhere classified: Secondary | ICD-10-CM | POA: Diagnosis not present

## 2021-08-28 DIAGNOSIS — G4719 Other hypersomnia: Secondary | ICD-10-CM

## 2021-08-28 DIAGNOSIS — Z820 Family history of epilepsy and other diseases of the nervous system: Secondary | ICD-10-CM

## 2021-08-28 DIAGNOSIS — Z6841 Body Mass Index (BMI) 40.0 and over, adult: Secondary | ICD-10-CM

## 2021-08-28 DIAGNOSIS — R351 Nocturia: Secondary | ICD-10-CM

## 2021-08-28 NOTE — Progress Notes (Signed)
Subjective:    Patient ID: Angela Johnson is a 36 y.o. female.  HPI    Huston Foley, MD, PhD [facility] [address] [address] [place], [place] [address]  Dear Dr. Nadine Counts,   I saw your patient, Angela Johnson, upon your kind request in my sleep clinic today for initial consultation of her sleep disorder, in particular, concern for underlying obstructive sleep apnea.  The patient is unaccompanied today.  As you know, Ms. [name] is a 36 year old right-handed woman with an underlying medical history of thyroiditis with hypothyroidism, reflux disease, hypertension, irritable bowel syndrome, palpitations, prediabetes, vitamin D deficiency, arthritis, prior history of substance use disorder (per chart review and self report, sober x 45 y, also prior excess alcohol consumption in the past), anxiety, depression, and morbid obesity with a BMI of over 50, who reports snoring and excessive daytime somnolence.  I reviewed your office note from 08/02/2021.  Her Epworth sleepiness score is 15 out of 24, fatigue severity score is 15 out of 63.  She reports a family history of sleep apnea affecting her father, paternal aunt and maternal aunt.  She reports erratic and heavy breathing while asleep as witnessed by her family.  Snoring has been witnessed to be disturbing per family members.  She lives with her husband and 2 children, ages 31 and 76.  She is working on weight loss, currently not on Ozempic as insurance did not cover it.  She has been referred to bariatric surgery for consideration of her weight loss options from the surgical standpoint.  She has an appointment in July for this.  She has a bedtime of around 11 or 11:30 PM and rise time between 6:45 AM and 7 AM.  She has nocturia about once or twice per night, she has occasionally woken up with a headache which is typically dull and achy but sometimes migraine-like.  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  She drinks caffeine in the form of diet soda, about 2 bottles per day.  She is a non-smoker and does not currently drink any alcohol.  She does not wake up rested.  They do have a TV in the bedroom but it is typically not on at night.  They have 2 dogs in the household, 1 sleeps on the bed with them.  Her Past Medical History Is Significant For: Past Medical History:  Diagnosis Date   Alcohol abuse    Anxiety    Arthritis    Back pain    Depression    Drug abuse ([facility])    Edema, lower extremity    GERD (gastroesophageal reflux disease)    Gonorrhea 03/31/2005   Treated   Herniated disc    Hypertension    Hypothyroidism    IBS (irritable bowel syndrome)    Irritable bowel syndrome (IBS)    Other hyperlipidemia    Palpitations    Pre-diabetes    SOB (shortness of breath)    Vitamin D deficiency     Her Past Surgical History Is Significant For: Past Surgical History:  Procedure Laterality Date   oral     wisdom teeth    Her Family History Is Significant For: Family History  Problem Relation Age of Onset   Diabetes Mother    Hypertension Mother    Hyperlipidemia Mother    Depression Mother    Anxiety disorder Mother  Anxiety disorder Father    Depression Father    Hyperlipidemia Father    Thyroid disease Father    Sleep apnea Father    Sleep apnea Paternal Aunt    Sleep apnea Maternal Aunt     Her Social History Is Significant For: Social History   Socioeconomic History   Marital status: Married    Spouse name: Not on file   Number of children: Not on file   Years of education: Not on file   Highest education level: Not on file  Occupational History   Occupation: Customer service/ CounsellorAccountant manager  Tobacco Use   Smoking status: Former    Types: Cigarettes    Quit date: 03/31/2005    Years since quitting: 16.4   Smokeless tobacco: Never  Vaping Use   Vaping Use: Never used  Substance and Sexual Activity    Alcohol use: No   Drug use: No    Types: "Crack" cocaine, Marijuana    Comment: quit 17 years ago   Sexual activity: Yes  Other Topics Concern   Not on file  Social History Narrative   Lives with husband and her 2 children   Right handed   Caffeine: 32 oz per day zero sugar soda or tea   Social Determinants of Health   Financial Resource Strain: Not on file  Food Insecurity: Not on file  Transportation Needs: Not on file  Physical Activity: Not on file  Stress: Not on file  Social Connections: Not on file    Her Allergies Are:  Allergies  Allergen Reactions   Ciprofloxacin Hcl Itching   Latex    Zithromax [Azithromycin]     dizziness  :   Her Current Medications Are:  Outpatient Encounter Medications as of 08/28/2021  Medication Sig   buPROPion (WELLBUTRIN XL) 300 MG 24 hr tablet Take 1 tablet (300 mg total) by mouth daily.   cetirizine (ZYRTEC) 10 MG tablet Take 10 mg by mouth daily. allergies    citalopram (CELEXA) 40 MG tablet TAKE 1 TABLET DAILY   fluticasone (FLONASE) 50 MCG/ACT nasal spray Place 2 sprays into both nostrils daily. (Patient taking differently: Place 2 sprays into both nostrils as needed.)   levothyroxine (SYNTHROID) 100 MCG tablet TAKE ONE TABLET ONCE DAILY   norgestrel-ethinyl estradiol (LO/OVRAL,CRYSELLE) 0.3-30 MG-MCG tablet Take 1 tablet by mouth daily.   Semaglutide,0.25 or 0.5MG /DOS, (OZEMPIC, 0.25 OR 0.5 MG/DOSE,) 2 MG/1.5ML SOPN Inject 0.25 mg into the skin every 7 (seven) days for 28 days, THEN 0.5 mg every 7 (seven) days for 28 days.   No facility-administered encounter medications on file as of 08/28/2021.  :   Review of Systems:  Out of a complete 14 point review of systems, all are reviewed and negative with the exception of these symptoms as listed below:    Review of Systems  Neurological:        Patient is here alone for a sleep consult. She reports daytime sleepiness. She stats she is tired all of the time. She has been told by  her mother that she breathes heavily especially while sleeping. ESS 15 FSS 50   Objective:  Neurological Exam  Physical Exam Physical Examination:   Vitals:   08/28/21 1026  BP: 133/81  Pulse: 80    General Examination: The patient is a very pleasant 36 y.o. female in no acute distress. She appears well-developed and well-nourished and well groomed.   HEENT: Normocephalic, atraumatic, pupils are equal, round and reactive to light, extraocular  tracking is good without limitation to gaze excursion or nystagmus noted. Hearing is grossly intact. Face is symmetric with normal facial animation. Speech is clear with no dysarthria noted. There is no hypophonia. There is no lip, neck/head, jaw or voice tremor. Neck is supple with full range of passive and active motion. There are no carotid bruits on auscultation. Oropharynx exam reveals: mild mouth dryness, good dental hygiene and moderate airway crowding, due to small airway entry, tonsils not fully visualized and tip of uvula not fully visualized, Mallampati class III.  Neck circumference of 19 1/4 inches.  Minimal overbite.  Tongue protrudes centrally and palate elevates symmetrically.  Chest: Clear to auscultation without wheezing, rhonchi or crackles noted.  Heart: S1+S2+0, regular and normal without murmurs, rubs or gallops noted.   Abdomen: Soft, non-tender and non-distended with normal bowel sounds appreciated on auscultation.  Extremities: There is no pitting edema in the distal lower extremities bilaterally.   Skin: Warm and dry without trophic changes noted.   Musculoskeletal: exam reveals no obvious joint deformities.   Neurologically:  Mental status: The patient is awake, alert and oriented in all 4 spheres. His immediate and remote memory, attention, language skills and fund of knowledge are appropriate. There is no evidence of aphasia, agnosia, apraxia or anomia. Speech is clear with normal prosody and enunciation. Thought  process is linear. Mood is normal and affect is normal.  Cranial nerves II - XII are as described above under HEENT exam.  Motor exam: Normal bulk, strength and tone is noted. There is no obvious tremor. Fine motor skills and coordination: grossly intact.  Cerebellar testing: No dysmetria or intention tremor. There is no truncal or gait ataxia.  Sensory exam: intact to light touch in the upper and lower extremities.  Gait, station and balance: She stands easily. No veering to one side is noted. No leaning to one side is noted. Posture is age-appropriate and stance is narrow based. Gait shows normal stride length and normal pace. No problems turning are noted.   Assessment and Plan:  In summary, Swayzee L Humber is a very pleasant 36 y.o.-year old female with an underlying medical history of thyroiditis with hypothyroidism, reflux disease, hypertension, irritable bowel syndrome, palpitations, prediabetes, vitamin D deficiency, arthritis, prior history of substance use disorder (per chart review and self report, sober x 105 y, also prior excess alcohol consumption in the past), anxiety, depression, and morbid obesity with a BMI of over 50, whose history and physical exam are concerning for obstructive sleep apnea (OSA). I had a long chat with the patient about my findings and the diagnosis of OSA, its prognosis and treatment options. We talked about medical treatments, surgical interventions and non-pharmacological approaches. I explained in particular the risks and ramifications of untreated moderate to severe OSA, especially with respect to developing cardiovascular disease down the Road, including congestive heart failure, difficult to treat hypertension, cardiac arrhythmias, or stroke. Even type 2 diabetes has, in part, been linked to untreated OSA. Symptoms of untreated OSA include daytime sleepiness, memory problems, mood irritability and mood disorder such as depression and anxiety, lack of energy, as  well as recurrent headaches, especially morning headaches. We talked about trying to maintain a healthy lifestyle in general, as well as the importance of weight control. We also talked about the importance of good sleep hygiene. I recommended the following at this time: sleep study.  I outlined the differences between a laboratory attended sleep study versus home sleep testing. I explained the sleep  test procedure to the patient and also outlined possible surgical and non-surgical treatment options of OSA, including the use of a custom-made dental device (which would require a referral to a specialist dentist or oral surgeon), upper airway surgical options, such as traditional UPPP or a novel less invasive surgical option in the form of Inspire hypoglossal nerve stimulation (which would involve a referral to an ENT surgeon). I also explained the CPAP treatment option to the patient, who indicated that she would be willing to try CPAP if the need arises. I explained the importance of being compliant with PAP treatment, not only for insurance purposes but primarily to improve Her symptoms, and for the patient's long term health benefit, including to reduce Her cardiovascular risks. I answered all her questions today and the patient was in agreement. I plan to see her back after the sleep study is completed and encouraged her to call with any interim questions, concerns, problems or updates.   Thank you very much for allowing me to participate in the care of this nice patient. If I can be of any further assistance to you please do not hesitate to call me at [phone].  Sincerely,   Huston Foley, MD, PhD

## 2021-08-28 NOTE — Patient Instructions (Signed)

## 2021-08-29 ENCOUNTER — Encounter: Payer: Self-pay | Admitting: Family Medicine

## 2021-08-30 NOTE — Telephone Encounter (Signed)
Those were prescribed by Prudy Feeler and never by me.  I don't typically use this class of medication outside of persons with seizure disorder or unless someone is on multiple anxiety meds and remains uncontrolled.  If she feels like she still needs them, she will need an appointment to discuss further and complete normal CSC, UDS, etc.

## 2021-09-13 ENCOUNTER — Ambulatory Visit: Payer: BC Managed Care – PPO | Admitting: Family Medicine

## 2021-09-19 ENCOUNTER — Telehealth: Payer: Self-pay | Admitting: Neurology

## 2021-09-19 NOTE — Telephone Encounter (Signed)
09/10/21 [employer] [place]ley Harvey: [id number] (exp. 09/10/21 to 11/08/21) EE left VM 09/19/21 [place]

## 2021-09-26 NOTE — Telephone Encounter (Signed)
x2 LVM for pt to call to schedule

## 2021-10-15 ENCOUNTER — Other Ambulatory Visit: Payer: Self-pay | Admitting: Family Medicine

## 2021-10-15 DIAGNOSIS — F410 Panic disorder [episodic paroxysmal anxiety] without agoraphobia: Secondary | ICD-10-CM

## 2021-10-21 ENCOUNTER — Ambulatory Visit (INDEPENDENT_AMBULATORY_CARE_PROVIDER_SITE_OTHER): Payer: BC Managed Care – PPO | Admitting: Family Medicine

## 2021-10-21 ENCOUNTER — Encounter: Payer: Self-pay | Admitting: Family Medicine

## 2021-10-21 DIAGNOSIS — R7303 Prediabetes: Secondary | ICD-10-CM

## 2021-10-21 DIAGNOSIS — Z23 Encounter for immunization: Secondary | ICD-10-CM | POA: Diagnosis not present

## 2021-10-21 DIAGNOSIS — E8881 Metabolic syndrome: Secondary | ICD-10-CM | POA: Diagnosis not present

## 2021-10-21 DIAGNOSIS — E781 Pure hyperglyceridemia: Secondary | ICD-10-CM

## 2021-10-21 DIAGNOSIS — Z6841 Body Mass Index (BMI) 40.0 and over, adult: Secondary | ICD-10-CM

## 2021-10-21 DIAGNOSIS — I10 Essential (primary) hypertension: Secondary | ICD-10-CM

## 2021-10-21 DIAGNOSIS — F411 Generalized anxiety disorder: Secondary | ICD-10-CM

## 2021-10-21 DIAGNOSIS — F32A Depression, unspecified: Secondary | ICD-10-CM

## 2021-10-21 DIAGNOSIS — G4733 Obstructive sleep apnea (adult) (pediatric): Secondary | ICD-10-CM

## 2021-10-21 DIAGNOSIS — E538 Deficiency of other specified B group vitamins: Secondary | ICD-10-CM | POA: Diagnosis not present

## 2021-10-21 MED ORDER — CYANOCOBALAMIN 1000 MCG/ML IJ SOLN
1000.0000 ug | Freq: Once | INTRAMUSCULAR | Status: AC
  Administered 2021-10-21: 1000 ug via INTRAMUSCULAR

## 2021-10-21 MED ORDER — INSULIN PEN NEEDLE 32G X 6 MM MISC: 3 refills | Status: DC

## 2021-10-21 MED ORDER — SAXENDA 18 MG/3ML ~~LOC~~ SOPN: PEN_INJECTOR | SUBCUTANEOUS | 12 refills | Status: DC

## 2021-10-21 MED ORDER — HYDROXYZINE HCL 25 MG PO TABS: 12.5000 mg | ORAL_TABLET | Freq: Three times a day (TID) | ORAL | 0 refills | Status: DC | PRN

## 2021-10-21 MED ORDER — BUSPIRONE HCL 7.5 MG PO TABS: ORAL_TABLET | ORAL | 0 refills | Status: DC

## 2021-10-21 NOTE — Progress Notes (Signed)
Subjective: CC: 6-week follow-up for anxiety and depression PCP: Raliegh Ip, DO Angela Johnson is a 36 y.o. female presenting to clinic today for:  1. Anxiety depression Patient is compliant with Celexa 40 mg daily, Wellbutrin XL 300 mg daily.  Several years ago, her previous PCP gave her Xanax to have on hand for panic attacks.  She uses this extremely sparingly.  Upon further questioning she has never been treated with BuSpar or Vistaril.  She was not aware of any risks associated with Xanax and would certainly not like to use that medication now that she knows this.  2.  Morbid obesity She would like to see if we can try and resubmit the GLP 1 meds for weight loss.  She does have an appointment with the bariatric specialist and has completed the psych eval.  She also has a home sleep study scheduled.  Apparently when she was on vacation recently her family members actually observed her having apneic spells, which she was totally unaware.  She continues to struggle with obesity and notes that she really struggled getting around recently at her vacation.  Apparently she called her insurance company and she was told that a letter need to be submitted and they would cover it  3.  B12 deficiency Patient was noted to have B12 deficiency on last lab draw.  She would like to proceed with B12 injection.  She is not currently taking anything orally but will plan to transition over to that.  Does not report any sensory changes in the feet but does report sensations of restless leg in the legs   ROS: Per HPI  Allergies  Allergen Reactions   Ciprofloxacin Hcl Itching   Latex    Zithromax [Azithromycin]     dizziness   Past Medical History:  Diagnosis Date   Alcohol abuse    Anxiety    Arthritis    Back pain    Depression    Drug abuse (HCC)    Edema, lower extremity    GERD (gastroesophageal reflux disease)    Gonorrhea 03/31/2005   Treated   Herniated disc     Hypertension    Hypothyroidism    IBS (irritable bowel syndrome)    Irritable bowel syndrome (IBS)    Other hyperlipidemia    Palpitations    Pre-diabetes    SOB (shortness of breath)    Vitamin D deficiency     Current Outpatient Medications:    buPROPion (WELLBUTRIN XL) 300 MG 24 hr tablet, Take 1 tablet (300 mg total) by mouth daily., Disp: 90 tablet, Rfl: 3   cetirizine (ZYRTEC) 10 MG tablet, Take 10 mg by mouth daily. allergies , Disp: , Rfl:    citalopram (CELEXA) 40 MG tablet, TAKE ONE TABLET ONCE DAILY, Disp: 90 tablet, Rfl: 3   fluticasone (FLONASE) 50 MCG/ACT nasal spray, Place 2 sprays into both nostrils daily. (Patient taking differently: Place 2 sprays into both nostrils as needed.), Disp: 16 g, Rfl: 6   levothyroxine (SYNTHROID) 100 MCG tablet, TAKE ONE TABLET ONCE DAILY, Disp: 90 tablet, Rfl: 0   norgestrel-ethinyl estradiol (LO/OVRAL,CRYSELLE) 0.3-30 MG-MCG tablet, Take 1 tablet by mouth daily., Disp: , Rfl:  Social History   Socioeconomic History   Marital status: Married    Spouse name: Not on file   Number of children: Not on file   Years of education: Not on file   Highest education level: Not on file  Occupational History   Occupation: Customer service/  Counsellor  Tobacco Use   Smoking status: Former    Types: Cigarettes    Quit date: 03/31/2005    Years since quitting: 16.5   Smokeless tobacco: Never  Vaping Use   Vaping Use: Never used  Substance and Sexual Activity   Alcohol use: No   Drug use: No    Types: "Crack" cocaine, Marijuana    Comment: quit 17 years ago   Sexual activity: Yes  Other Topics Concern   Not on file  Social History Narrative   Lives with husband and her 2 children   Right handed   Caffeine: 32 oz per day zero sugar soda or tea   Social Determinants of Health   Financial Resource Strain: Not on file  Food Insecurity: Not on file  Transportation Needs: Not on file  Physical Activity: Not on file  Stress: Not on  file  Social Connections: Not on file  Intimate Partner Violence: Not on file   Family History  Problem Relation Age of Onset   Diabetes Mother    Hypertension Mother    Hyperlipidemia Mother    Depression Mother    Anxiety disorder Mother    Anxiety disorder Father    Depression Father    Hyperlipidemia Father    Thyroid disease Father    Sleep apnea Father    Sleep apnea Paternal Aunt    Sleep apnea Maternal Aunt     Objective: Office vital signs reviewed. BP (!) 149/86   Pulse 74   Temp (!) 97.3 F (36.3 C)   Ht 5\' 10"  (1.778 m)   Wt (!) 397 lb 6.4 oz (180.3 kg)   SpO2 94%   BMI 57.02 kg/m   Physical Examination:  General: Awake, alert, morbidly obese, No acute distress HEENT: Sclera white Cardio: regular rate and rhythm, S1S2 heard, no murmurs appreciated Pulm: clear to auscultation bilaterally, no wheezes, rhonchi or rales; normal work of breathing on room air MSK: Ambulating independently Psych: Mood stable, speech normal     08/02/2021    2:06 PM 04/11/2021   10:51 AM 11/14/2020    8:33 AM  Depression screen PHQ 2/9  Decreased Interest 1 0 3  Down, Depressed, Hopeless 1 0 3  PHQ - 2 Score 2 0 6  Altered sleeping 3 2 2   Tired, decreased energy 3 2 3   Change in appetite 1 0 3  Feeling bad or failure about yourself  1 0 3  Trouble concentrating 1 0 3  Moving slowly or fidgety/restless 0 0 1  Suicidal thoughts 0 0 0  PHQ-9 Score 11 4 21   Difficult doing work/chores Somewhat difficult Somewhat difficult Somewhat difficult      08/02/2021    2:06 PM 04/11/2021   10:52 AM 09/18/2020    1:49 PM  GAD 7 : Generalized Anxiety Score  Nervous, Anxious, on Edge 1 0 1  Control/stop worrying 0 0 0  Worry too much - different things 0 0 0  Trouble relaxing 0 0 0  Restless 0 0 0  Easily annoyed or irritable 0 0 1  Afraid - awful might happen 0 0 0  Total GAD 7 Score 1 0 2  Anxiety Difficulty Not difficult at all Not difficult at all Not difficult at all       Assessment/ Plan: 36 y.o. female   Morbid obesity (HCC) - Plan: Liraglutide -Weight Management (SAXENDA) 18 MG/3ML SOPN, Insulin Pen Needle 32G X 6 MM MISC  BMI 50.0-59.9, adult (HCC) -  Plan: Liraglutide -Weight Management (SAXENDA) 18 MG/3ML SOPN, Insulin Pen Needle 32G X 6 MM MISC  Hypertriglyceridemia - Plan: Liraglutide -Weight Management (SAXENDA) 18 MG/3ML SOPN, Insulin Pen Needle 32G X 6 MM MISC  Metabolic syndrome - Plan: Liraglutide -Weight Management (SAXENDA) 18 MG/3ML SOPN, Insulin Pen Needle 32G X 6 MM MISC  Primary hypertension - Plan: Liraglutide -Weight Management (SAXENDA) 18 MG/3ML SOPN  Obstructive sleep apnea syndrome - Plan: Liraglutide -Weight Management (SAXENDA) 18 MG/3ML SOPN  Pre-diabetes - Plan: Liraglutide -Weight Management (SAXENDA) 18 MG/3ML SOPN  Insulin resistance - Plan: Liraglutide -Weight Management (SAXENDA) 18 MG/3ML SOPN  Vitamin B12 deficiency - Plan: cyanocobalamin ((VITAMIN B-12)) injection 1,000 mcg  Depressive disorder - Plan: busPIRone (BUSPAR) 7.5 MG tablet, Liraglutide -Weight Management (SAXENDA) 18 MG/3ML SOPN  GAD (generalized anxiety disorder) - Plan: busPIRone (BUSPAR) 7.5 MG tablet, hydrOXYzine (ATARAX) 25 MG tablet  We will retry getting the Saxenda approved.  She has multiple comorbidities that are directly related to her morbid obesity and BMI of over 57.  She is currently being worked up for what is most certainly an obstructive sleep apnea.  B12 was administered during today's visit and okay to proceed with sublingual B12 thereafter  Start BuSpar.  Atarax prescribed as well for as needed use.  I counseled her on benzodiazepine use and the risks associated with this.  She does not wish to proceed with that treatment which I think is totally appropriate.  She will continue her SSRI and Wellbutrin as prescribed and we will follow-up in the next 3 to 4 weeks, sooner if concerns arise  No orders of the defined types were  placed in this encounter.  No orders of the defined types were placed in this encounter.    Raliegh Ip, DO [facility] [phone]

## 2021-10-22 ENCOUNTER — Telehealth: Payer: Self-pay

## 2021-10-22 NOTE — Telephone Encounter (Signed)
AVONLEA SIMA (KeyKarl Pock) Rx #: [id number] Saxenda 18MG pen-injectors   Form [employer] Electronic PA Form [phone] NCPDP) Created 22 hours ago Sent to Plan 19 minutes ago Determination Wait for Questions [employer] NCPDP 2017 typically responds with questions in less than 15 minutes, but may take up to 24 hours.

## 2021-10-23 NOTE — Telephone Encounter (Signed)
X3 lvm

## 2021-10-23 NOTE — Telephone Encounter (Signed)
Patient returned my call she is scheduled at [facility] for 11/04/21 at 3:30 pm.  I mailed packet to the patient.

## 2021-10-24 NOTE — Telephone Encounter (Signed)
Fax sent to the plan Your PA has been faxed to the plan as a paper copy. Please contact the plan directly if you haven't received a determination in a typical timeframe.  You will be notified of the determination electronically and via fax. How do I know if the plan approved the PA?  Add Reminder to your Dashboard Remind me in:  5 business days Contact plan to follow up on [id number]

## 2021-10-31 NOTE — Telephone Encounter (Signed)
Lyn Henri (KeyKarl Pock) Rx #: [id number] Saxenda 18MG pen-injectors   Form [facility] [employer] Non-Medicare Formulary Exception / Prior Authorization Request Form  Plan Contact [phone] phone [phone] fax Created 10 days ago Sent to Plan 2 days ago Determination Unfavorable 15 hours ago Your prior authorization for [phone] has been denied. RETURN TO DASHBOARD When applicable, information about how to complete an appeal for this patient will be sent to you. Please also see the

## 2021-11-04 ENCOUNTER — Ambulatory Visit: Payer: BC Managed Care – PPO | Admitting: Neurology

## 2021-11-04 DIAGNOSIS — R351 Nocturia: Secondary | ICD-10-CM

## 2021-11-04 DIAGNOSIS — Z820 Family history of epilepsy and other diseases of the nervous system: Secondary | ICD-10-CM

## 2021-11-04 DIAGNOSIS — Z9189 Other specified personal risk factors, not elsewhere classified: Secondary | ICD-10-CM

## 2021-11-04 DIAGNOSIS — G4733 Obstructive sleep apnea (adult) (pediatric): Secondary | ICD-10-CM | POA: Diagnosis not present

## 2021-11-04 DIAGNOSIS — R0681 Apnea, not elsewhere classified: Secondary | ICD-10-CM

## 2021-11-04 DIAGNOSIS — R519 Headache, unspecified: Secondary | ICD-10-CM

## 2021-11-04 DIAGNOSIS — R0683 Snoring: Secondary | ICD-10-CM

## 2021-11-04 DIAGNOSIS — G4719 Other hypersomnia: Secondary | ICD-10-CM

## 2021-11-05 NOTE — Telephone Encounter (Signed)
Pt aware - FYI - she sent her sleep study back in yesterday

## 2021-11-05 NOTE — Telephone Encounter (Signed)
Please inform pt

## 2021-11-06 ENCOUNTER — Encounter (INDEPENDENT_AMBULATORY_CARE_PROVIDER_SITE_OTHER): Payer: Self-pay

## 2021-11-07 NOTE — Addendum Note (Signed)
Addended by: Huston Foley on: 11/07/2021 05:41 PM   Modules accepted: Orders

## 2021-11-07 NOTE — Progress Notes (Signed)
See procedure note.

## 2021-11-07 NOTE — Procedures (Signed)
   [facility]  HOME SLEEP TEST (Watch PAT) REPORT  STUDY DATE: 11/04/21  DOB: [birth year]  MRN: [id number]  ORDERING CLINICIAN: Huston Foley, MD, PhD   REFERRING CLINICIAN: Raliegh Ip, DO   CLINICAL INFORMATION/HISTORY: 36 year old right-handed woman with an underlying medical history of thyroiditis with hypothyroidism, reflux disease, hypertension, irritable bowel syndrome, palpitations, prediabetes, vitamin D deficiency, arthritis, anxiety, depression, and morbid obesity with a BMI of over 50, who reports snoring and excessive daytime somnolence.  Epworth sleepiness score: 15/24.  BMI: 56.8 kg/m  FINDINGS:   Sleep Summary:   Total Recording Time (hours, min): 6 hours, 50 min  Total Sleep Time (hours, min):  5 hours, 3 min  Percent REM (%):    7.4%   Respiratory Indices:   Calculated pAHI (per hour):  12.4/hour         REM pAHI:    n/a       NREM pAHI: 12.4/hour  Central pAHI: 0/hour  Oxygen Saturation Statistics:    Oxygen Saturation (%) Mean: 96%   Minimum oxygen saturation (%):                 86%   O2 Saturation Range (%): 86-99%    O2 Saturation (minutes) <=88%: 0 min  Pulse Rate Statistics:   Pulse Mean (bpm):    74/min    Pulse Range (62-98/min)   IMPRESSION: OSA (obstructive sleep apnea), mild  RECOMMENDATION:  This home sleep test demonstrates overall mild obstructive sleep apnea with a total AHI of 12.4/hour and O2 nadir of 86%. Snoring was detected, in the mild to moderate range, fairly consistently throughout the night. Given the patient's medical history and sleep related complaints, therapy with a  positive airway pressure device is a reasonable first-line choice and clinically recommended. Treatment can be achieved in the form of autoPAP trial/titration at home for now. A full night, in-lab PAP titration study may aid in improving proper treatment settings and with mask fit, if needed, down the road. Alternative  treatments may include weight loss (where appropriate) along with avoidance of the supine sleep position (if possible), or an oral appliance in appropriate candidates.   Please note that untreated obstructive sleep apnea may carry additional perioperative morbidity. Patients with significant obstructive sleep apnea should receive perioperative PAP therapy and the surgeons and particularly the anesthesiologist should be informed of the diagnosis and the severity of the sleep disordered breathing. The patient should be cautioned not to drive, work at heights, or operate dangerous or heavy equipment when tired or sleepy. Review and reiteration of good sleep hygiene measures should be pursued with any patient. Other causes of the patient's symptoms, including circadian rhythm disturbances, an underlying mood disorder, medication effect and/or an underlying medical problem cannot be ruled out based on this test. Clinical correlation is recommended.  The patient and her referring provider will be notified of the test results. The patient will be seen in follow up in sleep clinic at [facility], as necessary.  I certify that I have reviewed the raw data recording prior to the issuance of this report in accordance with the standards of the American Academy of Sleep Medicine (AASM).  INTERPRETING PHYSICIAN:   Huston Foley, MD, PhD  Board Certified in Neurology and Sleep Medicine  [facility] [address], [address] [place], [place] [address] [phone]

## 2021-11-12 ENCOUNTER — Ambulatory Visit (INDEPENDENT_AMBULATORY_CARE_PROVIDER_SITE_OTHER): Payer: BC Managed Care – PPO | Admitting: Family Medicine

## 2021-11-12 ENCOUNTER — Encounter: Payer: Self-pay | Admitting: Family Medicine

## 2021-11-12 DIAGNOSIS — E8881 Metabolic syndrome: Secondary | ICD-10-CM

## 2021-11-12 DIAGNOSIS — M5416 Radiculopathy, lumbar region: Secondary | ICD-10-CM

## 2021-11-12 DIAGNOSIS — G4733 Obstructive sleep apnea (adult) (pediatric): Secondary | ICD-10-CM | POA: Insufficient documentation

## 2021-11-12 DIAGNOSIS — F411 Generalized anxiety disorder: Secondary | ICD-10-CM | POA: Diagnosis not present

## 2021-11-12 DIAGNOSIS — Z6841 Body Mass Index (BMI) 40.0 and over, adult: Secondary | ICD-10-CM | POA: Diagnosis not present

## 2021-11-12 DIAGNOSIS — I10 Essential (primary) hypertension: Secondary | ICD-10-CM

## 2021-11-12 DIAGNOSIS — E781 Pure hyperglyceridemia: Secondary | ICD-10-CM

## 2021-11-12 DIAGNOSIS — F32A Depression, unspecified: Secondary | ICD-10-CM | POA: Diagnosis not present

## 2021-11-12 MED ORDER — BUSPIRONE HCL 15 MG PO TABS: 15.0000 mg | ORAL_TABLET | Freq: Two times a day (BID) | ORAL | 0 refills | Status: DC

## 2021-11-12 NOTE — Progress Notes (Signed)
Telephone visit  Subjective: CC: Follow-up morbid obesity PCP: Raliegh Ip, DO IDP:OEUMP L Mineo is a 36 y.o. female calls for telephone consult today. Patient provides verbal consent for consult held via phone.  Due to COVID-19 pandemic this visit was conducted virtually. This visit type was conducted due to national recommendations for restrictions regarding the COVID-19 Pandemic (e.g. social distancing, sheltering in place) in an effort to limit this patient's exposure and mitigate transmission in our community. All issues noted in this document were discussed and addressed.  A physical exam was not performed with this format.   Location of patient: home Location of provider: [facility] Others present for call: none  1.  Morbid obesity associated with hypertension, hyperlipidemia, lumbar spondylosis and now obstructive sleep apnea Patient recently diagnosed with obstructive sleep apnea via sleep study.  She has yet to receive a call to set her up for CPAP machine.  She has yet to follow-up with neurology with regards to this as well she still awaiting formal call about her results which she viewed online.  She is hopeful that this CPAP machine will help with her energy.  We tried to order Saxenda for her and this apparently was rejected by her insurance company.  She asked that we try ordering this again for her now that she has been diagnosed with obstructive sleep apnea.  She also plans on scheduling an appointment soon with bariatric surgery.  2.  Depression and anxiety She reports that she is to read the BuSpar without difficulty but admits that she has not given it a fair chance to really improve her anxiety and depressive symptoms.  She is not taking more than 7.5 mg daily right now.  She had a lot going on the summer and really has not had time to focus on herself but she is putting herself at the forefront to get things together.   ROS: Per HPI  Allergies  Allergen Reactions    Ciprofloxacin Hcl Itching   Latex    Zithromax [Azithromycin]     dizziness   Past Medical History:  Diagnosis Date   Alcohol abuse    Anxiety    Arthritis    Back pain    Depression    Drug abuse (HCC)    Edema, lower extremity    GERD (gastroesophageal reflux disease)    Gonorrhea 03/31/2005   Treated   Herniated disc    Hypertension    Hypothyroidism    IBS (irritable bowel syndrome)    Irritable bowel syndrome (IBS)    Other hyperlipidemia    Palpitations    Pre-diabetes    SOB (shortness of breath)    Vitamin D deficiency     Current Outpatient Medications:    buPROPion (WELLBUTRIN XL) 300 MG 24 hr tablet, Take 1 tablet (300 mg total) by mouth daily., Disp: 90 tablet, Rfl: 3   busPIRone (BUSPAR) 7.5 MG tablet, Take 1 tablet (7.5 mg total) by mouth 2 (two) times daily for 7 days, THEN 1.5 tablets (11.25 mg total) 2 (two) times daily for 7 days, THEN 2 tablets (15 mg total) 2 (two) times daily for 15 days., Disp: 95 tablet, Rfl: 0   cetirizine (ZYRTEC) 10 MG tablet, Take 10 mg by mouth daily. allergies , Disp: , Rfl:    citalopram (CELEXA) 40 MG tablet, TAKE ONE TABLET ONCE DAILY, Disp: 90 tablet, Rfl: 3   fluticasone (FLONASE) 50 MCG/ACT nasal spray, Place 2 sprays into both nostrils daily. (Patient taking differently:  Place 2 sprays into both nostrils as needed.), Disp: 16 g, Rfl: 6   hydrOXYzine (ATARAX) 25 MG tablet, Take 0.5-1 tablets (12.5-25 mg total) by mouth every 8 (eight) hours as needed for anxiety (panic attack)., Disp: 30 tablet, Rfl: 0   Insulin Pen Needle 32G X 6 MM MISC, UAD with saxenda, Disp: 100 each, Rfl: 3   levothyroxine (SYNTHROID) 100 MCG tablet, TAKE ONE TABLET ONCE DAILY, Disp: 90 tablet, Rfl: 0   Liraglutide -Weight Management (SAXENDA) 18 MG/3ML SOPN, Inject 0.6 mg into the skin daily for 7 days, THEN 1.2 mg daily for 7 days, THEN 1.8 mg daily for 7 days, THEN 2.4 mg daily for 7 days, THEN 3 mg daily., Disp: 15 mL, Rfl: 12   norgestrel-ethinyl  estradiol (LO/OVRAL,CRYSELLE) 0.3-30 MG-MCG tablet, Take 1 tablet by mouth daily., Disp: , Rfl:   Assessment/ Plan: 36 y.o. female   Depressive disorder - Plan: busPIRone (BUSPAR) 15 MG tablet  GAD (generalized anxiety disorder) - Plan: busPIRone (BUSPAR) 15 MG tablet  Morbid obesity (HCC)  BMI 50.0-59.9, adult (HCC)  Hypertriglyceridemia  Metabolic syndrome  Primary hypertension  OSA (obstructive sleep apnea)  Lumbar radiculopathy  Anxiety depression are essentially stable.  She does admit that she has not really taken the BuSpar as directed just yet but she has plans to get this back on track and will let me know how things go  Officially diagnosed with obstructive sleep apnea.  Given metabolic syndrome which includes elevated blood pressure, hypertriglyceridemia and BMI over 50 we are going to reattempt to get that [place] approved.  Advised to follow-up as needed lumbar radiculopathy.  This is affecting her right lateral lower leg and I reviewed her previous MRI which did show a disc bulge on this region.  Glad to repeat MRI since its been several years and symptoms are progressive but she will let me know when she is ready to make the steps  Start time: 10:00a End time: 10:17a  Total time spent on patient care (including telephone call/ virtual visit): 17 minutes  Aaralynn Shepheard Hulen Skains, DO [facility] [phone]

## 2021-11-19 ENCOUNTER — Other Ambulatory Visit: Payer: Self-pay | Admitting: *Deleted

## 2021-11-19 ENCOUNTER — Encounter: Payer: Self-pay | Admitting: Family Medicine

## 2021-11-19 DIAGNOSIS — E063 Autoimmune thyroiditis: Secondary | ICD-10-CM

## 2021-11-19 MED ORDER — LEVOTHYROXINE SODIUM 100 MCG PO TABS: 100.0000 ug | ORAL_TABLET | Freq: Every day | ORAL | 2 refills | Status: DC

## 2021-11-19 NOTE — Telephone Encounter (Signed)
TC from [facility] request for RF on Levothyroxine, fax not coming through Last labs done in May, refills sent in till May

## 2021-11-20 ENCOUNTER — Ambulatory Visit (INDEPENDENT_AMBULATORY_CARE_PROVIDER_SITE_OTHER): Payer: BC Managed Care – PPO | Admitting: Family Medicine

## 2021-11-20 ENCOUNTER — Encounter: Payer: Self-pay | Admitting: Family Medicine

## 2021-11-20 VITALS — BP 128/76 | HR 85 | Temp 98.2°F | Ht 70.0 in | Wt 396.0 lb

## 2021-11-20 DIAGNOSIS — K625 Hemorrhage of anus and rectum: Secondary | ICD-10-CM

## 2021-11-20 MED ORDER — PANTOPRAZOLE SODIUM 40 MG PO TBEC: 40.0000 mg | DELAYED_RELEASE_TABLET | Freq: Every day | ORAL | 3 refills | Status: DC

## 2021-11-20 NOTE — Progress Notes (Signed)
Subjective:  Patient ID: Angela Johnson, female    DOB: 04-Oct-1985, 36 y.o.   MRN: [id number]  Patient Care Team: Janora Norlander, DO as PCP - General (Family Medicine) Starlyn Skeans, MD as Consulting Physician (Family Medicine)   Chief Complaint:  Blood In Stools   HPI: Angela Johnson is a 36 y.o. female presenting on 11/20/2021 for Blood In Stools   Pt presents today with complaints of blood in stool this morning. She has experienced this in the past but reports she had more blood this time. Bright red in nature, no clotting or pain. Denies hemorrhoids currently, has had issues in the past. She does have IBS and treats with OTC medications when needed. No constipation, nausea, vomiting, weakness, fatigue, shortness of breath, palpitations, or chest pain.   Rectal Bleeding  The current episode started today. The onset was sudden. Episode frequency: Once. The problem has been resolved. The patient is experiencing no pain. The stool is described as soft. Pertinent negatives include no anorexia, no fever, no abdominal pain, no diarrhea, no hematemesis, no hemorrhoids, no nausea, no rectal pain, no vomiting, no hematuria, no vaginal bleeding, no vaginal discharge, no chest pain, no headaches, no coughing, no difficulty breathing and no rash.     Relevant past medical, surgical, family, and social history reviewed and updated as indicated.  Allergies and medications reviewed and updated. Data reviewed: Chart in Epic.   Past Medical History:  Diagnosis Date   Alcohol abuse    Anxiety    Arthritis    Back pain    Depression    Drug abuse (HCC)    Edema, lower extremity    GERD (gastroesophageal reflux disease)    Gonorrhea 03/31/2005   Treated   Herniated disc    Hypertension    Hypothyroidism    IBS (irritable bowel syndrome)    Irritable bowel syndrome (IBS)    Other hyperlipidemia    Palpitations    Pre-diabetes    SOB (shortness of breath)    Vitamin D  deficiency     Past Surgical History:  Procedure Laterality Date   oral     wisdom teeth    Social History   Socioeconomic History   Marital status: Married    Spouse name: Not on file   Number of children: Not on file   Years of education: Not on file   Highest education level: Not on file  Occupational History   Occupation: Customer service/ Energy manager  Tobacco Use   Smoking status: Former    Types: Cigarettes    Quit date: 03/31/2005    Years since quitting: 16.6   Smokeless tobacco: Never  Vaping Use   Vaping Use: Never used  Substance and Sexual Activity   Alcohol use: No   Drug use: No    Types: "Crack" cocaine, Marijuana    Comment: quit 17 years ago   Sexual activity: Yes  Other Topics Concern   Not on file  Social History Narrative   Lives with husband and her 2 children   Right handed   Caffeine: 32 oz per day zero sugar soda or tea   Social Determinants of Health   Financial Resource Strain: Not on file  Food Insecurity: Not on file  Transportation Needs: Not on file  Physical Activity: Not on file  Stress: Not on file  Social Connections: Not on file  Intimate Partner Violence: Not on file    Outpatient Encounter  Medications as of 11/20/2021  Medication Sig   buPROPion (WELLBUTRIN XL) 300 MG 24 hr tablet Take 1 tablet (300 mg total) by mouth daily.   busPIRone (BUSPAR) 15 MG tablet Take 1 tablet (15 mg total) by mouth 2 (two) times daily. Put on file   cetirizine (ZYRTEC) 10 MG tablet Take 10 mg by mouth daily. allergies    citalopram (CELEXA) 40 MG tablet TAKE ONE TABLET ONCE DAILY   fluticasone (FLONASE) 50 MCG/ACT nasal spray Place 2 sprays into both nostrils daily. (Patient taking differently: Place 2 sprays into both nostrils as needed.)   hydrOXYzine (ATARAX) 25 MG tablet Take 0.5-1 tablets (12.5-25 mg total) by mouth every 8 (eight) hours as needed for anxiety (panic attack).   Insulin Pen Needle 32G X 6 MM MISC UAD with saxenda    levothyroxine (SYNTHROID) 100 MCG tablet Take 1 tablet (100 mcg total) by mouth daily.   norgestrel-ethinyl estradiol (LO/OVRAL,CRYSELLE) 0.3-30 MG-MCG tablet Take 1 tablet by mouth daily.   pantoprazole (PROTONIX) 40 MG tablet Take 1 tablet (40 mg total) by mouth daily.   Liraglutide -Weight Management (SAXENDA) 18 MG/3ML SOPN Inject 0.6 mg into the skin daily for 7 days, THEN 1.2 mg daily for 7 days, THEN 1.8 mg daily for 7 days, THEN 2.4 mg daily for 7 days, THEN 3 mg daily. (Patient not taking: Reported on 11/20/2021)   No facility-administered encounter medications on file as of 11/20/2021.    Allergies  Allergen Reactions   Ciprofloxacin Hcl Itching   Latex    Zithromax [Azithromycin]     dizziness    Review of Systems  Constitutional:  Negative for activity change, appetite change, chills, diaphoresis, fatigue, fever and unexpected weight change.  Eyes:  Negative for photophobia and visual disturbance.  Respiratory:  Negative for cough and shortness of breath.   Cardiovascular:  Negative for chest pain, palpitations and leg swelling.  Gastrointestinal:  Positive for blood in stool and hematochezia. Negative for abdominal distention, abdominal pain, anal bleeding, anorexia, constipation, diarrhea, hematemesis, hemorrhoids, nausea, rectal pain and vomiting.  Genitourinary:  Negative for decreased urine volume, difficulty urinating, hematuria, vaginal bleeding and vaginal discharge.  Musculoskeletal:  Negative for arthralgias and myalgias.  Skin:  Negative for rash.  Neurological:  Negative for dizziness, tremors, seizures, syncope, facial asymmetry, speech difficulty, weakness, light-headedness, numbness and headaches.  Psychiatric/Behavioral:  Negative for confusion.   All other systems reviewed and are negative.       Objective:  BP 128/76   Pulse 85   Temp 98.2 F (36.8 C)   Ht _0  (1.778 m)   Wt (!) 396 lb (179.6 kg)   LMP 11/13/2021 (Approximate)   SpO2 96%   BMI  56.82 kg/m    Wt Readings from Last 3 Encounters:  11/20/21 (!) 396 lb (179.6 kg)  10/21/21 (!) 397 lb 6.4 oz (180.3 kg)  08/28/21 (!) 396 lb (179.6 kg)    Physical Exam Vitals and nursing note reviewed.  Constitutional:      General: She is not in acute distress.    Appearance: Normal appearance. She is morbidly obese. She is not ill-appearing, toxic-appearing or diaphoretic.  HENT:     Head: Normocephalic and atraumatic.  Eyes:     Conjunctiva/sclera: Conjunctivae normal.     Pupils: Pupils are equal, round, and reactive to light.  Cardiovascular:     Rate and Rhythm: Normal rate and regular rhythm.     Heart sounds: Normal heart sounds.  Abdominal:  General: Abdomen is protuberant. Bowel sounds are normal. There is no distension.     Palpations: Abdomen is soft.     Tenderness: There is no abdominal tenderness.  Musculoskeletal:     Right lower leg: No edema.     Left lower leg: No edema.  Skin:    General: Skin is warm and dry.     Capillary Refill: Capillary refill takes less than 2 seconds.     Coloration: Skin is not pale.  Neurological:     General: No focal deficit present.     Mental Status: She is alert and oriented to person, place, and time.  Psychiatric:        Mood and Affect: Mood normal.        Behavior: Behavior normal.        Thought Content: Thought content normal.        Judgment: Judgment normal.     Results for orders placed or performed in visit on 08/08/21  Vitamin B12  Result Value Ref Range   Vitamin B-12 256 232 - 1,245 pg/mL  T4, free  Result Value Ref Range   Free T4 1.02 0.82 - 1.77 ng/dL  TSH  Result Value Ref Range   TSH 4.320 0.450 - 4.500 uIU/mL  CBC  Result Value Ref Range   WBC 7.1 3.4 - 10.8 x10E3/uL   RBC 4.28 3.77 - 5.28 x10E6/uL   Hemoglobin 12.7 11.1 - 15.9 g/dL   Hematocrit 37.7 34.0 - 46.6 %   MCV 88 79 - 97 fL   MCH 29.7 26.6 - 33.0 pg   MCHC 33.7 31.5 - 35.7 g/dL   RDW 13.5 11.7 - 15.4 %   Platelets 207  150 - 450 x10E3/uL  Bayer DCA Hb A1c Waived  Result Value Ref Range   HB A1C (BAYER DCA - WAIVED) 5.4 4.8 - 5.6 %  Lipid panel  Result Value Ref Range   Cholesterol, Total 216 (H) 100 - 199 mg/dL   Triglycerides 284 (H) 0 - 149 mg/dL   HDL 38 (L) >39 mg/dL   VLDL Cholesterol Cal 51 (H) 5 - 40 mg/dL   LDL Chol Calc (NIH) 127 (H) 0 - 99 mg/dL   Chol/HDL Ratio 5.7 (H) 0.0 - 4.4 ratio  CMP14+EGFR  Result Value Ref Range   Glucose 141 (H) 70 - 99 mg/dL   BUN 13 6 - 20 mg/dL   Creatinine, Ser 0.93 0.57 - 1.00 mg/dL   eGFR 82 >59 mL/min/1.73   BUN/Creatinine Ratio 14 9 - 23   Sodium 137 134 - 144 mmol/L   Potassium 4.3 3.5 - 5.2 mmol/L   Chloride 101 96 - 106 mmol/L   CO2 21 20 - 29 mmol/L   Calcium 9.1 8.7 - 10.2 mg/dL   Total Protein 6.9 6.0 - 8.5 g/dL   Albumin 4.1 3.8 - 4.8 g/dL   Globulin, Total 2.8 1.5 - 4.5 g/dL   Albumin/Globulin Ratio 1.5 1.2 - 2.2   Bilirubin Total 0.2 0.0 - 1.2 mg/dL   Alkaline Phosphatase 79 44 - 121 IU/L   AST 16 0 - 40 IU/L   ALT 14 0 - 32 IU/L       Pertinent labs & imaging results that were available during my care of the patient were reviewed by me and considered in my medical decision making.  Assessment & Plan:  Scherry was seen today for blood in stools.  Diagnoses and all orders for this visit:  Rectal bleeding  One episode of bright red blood in stool. Labs pending. Referral to GI. Will place on PPI therapy. Pt aware of red flags. Further treatment pending results.  -     BMP8+EGFR -     CBC with Differential/Platelet -     Ambulatory referral to Gastroenterology -     pantoprazole (PROTONIX) 40 MG tablet; Take 1 tablet (40 mg total) by mouth daily. -     Fecal occult blood, imunochemical; Future     Continue all other maintenance medications.  Follow up plan: Return if symptoms worsen or fail to improve.   Continue healthy lifestyle choices, including diet (rich in fruits, vegetables, and lean proteins, and low in salt and simple  carbohydrates) and exercise (at least 30 minutes of moderate physical activity daily).  Educational handout given for rectal bleeding  The above assessment and management plan was discussed with the patient. The patient verbalized understanding of and has agreed to the management plan. Patient is aware to call the clinic if they develop any new symptoms or if symptoms persist or worsen. Patient is aware when to return to the clinic for a follow-up visit. Patient educated on when it is appropriate to go to the emergency department.   Monia Pouch, FNP-C [facility] [phone]

## 2021-11-21 ENCOUNTER — Telehealth: Payer: Self-pay | Admitting: *Deleted

## 2021-11-21 ENCOUNTER — Other Ambulatory Visit: Payer: BC Managed Care – PPO

## 2021-11-21 DIAGNOSIS — K625 Hemorrhage of anus and rectum: Secondary | ICD-10-CM | POA: Diagnosis not present

## 2021-11-21 LAB — CBC WITH DIFFERENTIAL/PLATELET
Basophils Absolute: 0.1 10*3/uL (ref 0.0–0.2)
Basos: 1 %
EOS (ABSOLUTE): 0.2 10*3/uL (ref 0.0–0.4)
Eos: 3 %
Hematocrit: 37.7 % (ref 34.0–46.6)
Hemoglobin: 12.8 g/dL (ref 11.1–15.9)
Immature Grans (Abs): 0 10*3/uL (ref 0.0–0.1)
Immature Granulocytes: 0 %
Lymphocytes Absolute: 2.4 10*3/uL (ref 0.7–3.1)
Lymphs: 32 %
MCH: 30.5 pg (ref 26.6–33.0)
MCHC: 34 g/dL (ref 31.5–35.7)
MCV: 90 fL (ref 79–97)
Monocytes Absolute: 0.4 10*3/uL (ref 0.1–0.9)
Monocytes: 5 %
Neutrophils Absolute: 4.5 10*3/uL (ref 1.4–7.0)
Neutrophils: 59 %
Platelets: 212 10*3/uL (ref 150–450)
RBC: 4.19 x10E6/uL (ref 3.77–5.28)
RDW: 13.8 % (ref 11.7–15.4)
WBC: 7.6 10*3/uL (ref 3.4–10.8)

## 2021-11-21 LAB — BMP8+EGFR
BUN/Creatinine Ratio: 15 (ref 9–23)
BUN: 12 mg/dL (ref 6–20)
CO2: 25 mmol/L (ref 20–29)
Calcium: 9.5 mg/dL (ref 8.7–10.2)
Chloride: 102 mmol/L (ref 96–106)
Creatinine, Ser: 0.82 mg/dL (ref 0.57–1.00)
Glucose: 97 mg/dL (ref 70–99)
Potassium: 4.5 mmol/L (ref 3.5–5.2)
Sodium: 142 mmol/L (ref 134–144)
eGFR: 95 mL/min/{1.73_m2} (ref >59)

## 2021-11-21 NOTE — Telephone Encounter (Signed)
Per chart, patient was called and LVM regarding results on 8/15 and 8/16. I called pt today and LVM (ok per DPR) advising her of sleep study results which showed mild OSA. Overall Dr Frances Furbish feels it would be worth treating to see if pt feels better after treatment. Advised recommendation is to treat with autopap therapy. We would send order to a DME company. I asked the patient to call us back to discuss the next steps.  Also sent mychart message to pt and sent result over to referring provider which included request that if PCP talks to patient to let her know we have been trying to reach her.

## 2021-11-21 NOTE — Telephone Encounter (Signed)
Noted, thank you

## 2021-11-21 NOTE — Telephone Encounter (Signed)
-----   Message from Huston Foley, MD sent at 11/07/2021  5:41 PM EDT ----- Patient referred by Dr. Nadine Counts, seen by me on 08/28/2021, HST 11/04/2021.    Please call and notify the patient that the recent home sleep test showed obstructive sleep apnea. OSA is overall mild, but worth treating to see if she feels better after treatment. To that end I recommend treatment for this in the form of autoPAP, which means, that we don't have to bring her in for a sleep study with CPAP, but will let her try an autoPAP machine at home, through a DME company (of her choice, or as per insurance requirement). The DME representative will educate her on how to use the machine, how to put the mask on, etc. I have placed an order in the chart. Please send referral, talk to patient, send report to referring MD. We will need a FU in sleep clinic for 10 weeks post-PAP set up, please arrange that with me or one of our NPs. Thanks,   Huston Foley, MD, PhD [facility])

## 2021-11-22 LAB — FECAL OCCULT BLOOD, IMMUNOCHEMICAL: Fecal Occult Bld: NEGATIVE

## 2022-01-02 ENCOUNTER — Encounter: Payer: Self-pay | Admitting: Family Medicine

## 2022-01-17 ENCOUNTER — Telehealth (INDEPENDENT_AMBULATORY_CARE_PROVIDER_SITE_OTHER): Payer: BC Managed Care – PPO | Admitting: Family Medicine

## 2022-01-17 ENCOUNTER — Encounter: Payer: Self-pay | Admitting: Family Medicine

## 2022-01-17 VITALS — BP 130/92

## 2022-01-17 DIAGNOSIS — E063 Autoimmune thyroiditis: Secondary | ICD-10-CM

## 2022-01-17 DIAGNOSIS — I10 Essential (primary) hypertension: Secondary | ICD-10-CM | POA: Diagnosis not present

## 2022-01-17 DIAGNOSIS — G43019 Migraine without aura, intractable, without status migrainosus: Secondary | ICD-10-CM | POA: Diagnosis not present

## 2022-01-17 DIAGNOSIS — M79604 Pain in right leg: Secondary | ICD-10-CM

## 2022-01-17 DIAGNOSIS — M79605 Pain in left leg: Secondary | ICD-10-CM

## 2022-01-17 DIAGNOSIS — G4733 Obstructive sleep apnea (adult) (pediatric): Secondary | ICD-10-CM | POA: Diagnosis not present

## 2022-01-17 DIAGNOSIS — Z8349 Family history of other endocrine, nutritional and metabolic diseases: Secondary | ICD-10-CM

## 2022-01-17 MED ORDER — HYDROCHLOROTHIAZIDE 25 MG PO TABS: 25.0000 mg | ORAL_TABLET | Freq: Every day | ORAL | 3 refills | Status: DC

## 2022-01-17 NOTE — Progress Notes (Signed)
MyChart Video visit  Subjective: Angela Johnson PCP: Janora Norlander, DO [id number] Angela Johnson is a 36 y.o. female. Patient provides verbal consent for consult held via video.  Due to COVID-19 pandemic this visit was conducted virtually. This visit type was conducted due to national recommendations for restrictions regarding the COVID-19 Pandemic (e.g. social distancing, sheltering in place) in an effort to limit this patient's exposure and mitigate transmission in our community. All issues noted in this document were discussed and addressed.  A physical exam was not performed with this format.   Location of patient: outside Location of provider: [facility] Others present for call: none  1. Headache/ BP Has had several debilitating migraines in last couples of weeks that caused photophobia, phonophobia, nausea.  Motrin did not help.  Had to cover head and wait it out. Still has not been able to get her CPAP machine due to insurance issues. BPs were elevated around time of her headaches.  She notes her blood pressures typically run 130s over 90s and sometimes 140s over high 90s.  No chest pain or shortness of breath reported but does report fatigue.  Has history of B12 deficiency and wonders if perhaps that might be causing  ROS: Per HPI  Allergies  Allergen Reactions   Ciprofloxacin Hcl Itching   Latex    Zithromax [Azithromycin]     dizziness   Past Medical History:  Diagnosis Date   Alcohol abuse    Anxiety    Arthritis    Back pain    Depression    Drug abuse (HCC)    Edema, lower extremity    GERD (gastroesophageal reflux disease)    Gonorrhea 03/31/2005   Treated   Herniated disc    Hypertension    Hypothyroidism    IBS (irritable bowel syndrome)    Irritable bowel syndrome (IBS)    Other hyperlipidemia    Palpitations    Pre-diabetes    SOB (shortness of breath)    Vitamin D deficiency     Current Outpatient Medications:    buPROPion (WELLBUTRIN XL) 300 MG 24 hr  tablet, Take 1 tablet (300 mg total) by mouth daily., Disp: 90 tablet, Rfl: 3   busPIRone (BUSPAR) 15 MG tablet, Take 1 tablet (15 mg total) by mouth 2 (two) times daily. Put on file, Disp: 180 tablet, Rfl: 0   cetirizine (ZYRTEC) 10 MG tablet, Take 10 mg by mouth daily. allergies , Disp: , Rfl:    citalopram (CELEXA) 40 MG tablet, TAKE ONE TABLET ONCE DAILY, Disp: 90 tablet, Rfl: 3   fluticasone (FLONASE) 50 MCG/ACT nasal spray, Place 2 sprays into both nostrils daily. (Patient taking differently: Place 2 sprays into both nostrils as needed.), Disp: 16 g, Rfl: 6   hydrOXYzine (ATARAX) 25 MG tablet, Take 0.5-1 tablets (12.5-25 mg total) by mouth every 8 (eight) hours as needed for anxiety (panic attack)., Disp: 30 tablet, Rfl: 0   Insulin Pen Needle 32G X 6 MM MISC, UAD with saxenda, Disp: 100 each, Rfl: 3   levothyroxine (SYNTHROID) 100 MCG tablet, Take 1 tablet (100 mcg total) by mouth daily., Disp: 90 tablet, Rfl: 2   Liraglutide -Weight Management (SAXENDA) 18 MG/3ML SOPN, Inject 0.6 mg into the skin daily for 7 days, THEN 1.2 mg daily for 7 days, THEN 1.8 mg daily for 7 days, THEN 2.4 mg daily for 7 days, THEN 3 mg daily. (Patient not taking: Reported on 11/20/2021), Disp: 15 mL, Rfl: 12   norgestrel-ethinyl estradiol (LO/OVRAL,CRYSELLE) 0.3-30 MG-MCG  tablet, Take 1 tablet by mouth daily., Disp: , Rfl:    pantoprazole (PROTONIX) 40 MG tablet, Take 1 tablet (40 mg total) by mouth daily., Disp: 30 tablet, Rfl: 3  Assessment/ Plan: 36 y.o. female   Essential hypertension - Plan: hydrochlorothiazide (HYDRODIURIL) 25 MG tablet  OSA (obstructive sleep apnea)  Intractable migraine without aura and without status migrainosus  Morbid obesity (HCC)  Leg pain, bilateral  Family history of vitamin B12 deficiency - Plan: Vitamin B12  Hashimoto's thyroiditis - Plan: TSH, T4, free  Blood pressure is not controlled.  Start hydrochlorothiazide.  Obstructive sleep apnea is not currently treated but  she is with a new company now so hopefully she will be able to get a CPAP machine.  I think this would improve both blood pressure, fatigability and headaches.  For now we are going to hold off on initiation of any headache medication to see if perhaps just controlling the blood pressure and improving sleep will help.  We discussed that many of the medications that are used for abortive therapy increase cardiovascular risk.  May need to consider something like Emgality or Ajovy or perhaps Nurtec for treatment if ongoing despite controlled blood pressure and sleep apnea  For her b/Angela her leg pain, we discussed utilization of seated exercise.  Weight is impacting her mobility.  Check B12 level, thyroid levels given fatigability  Start time: 9:15a End time: 9:31am  Total time spent on patient care (including video visit/ documentation): 16 minutes  Angela Johnson Hulen Skains, DO [facility] [phone]

## 2022-01-23 ENCOUNTER — Encounter: Payer: Self-pay | Admitting: Family Medicine

## 2022-01-23 ENCOUNTER — Encounter: Payer: BC Managed Care – PPO | Admitting: Family Medicine

## 2022-01-23 NOTE — Progress Notes (Signed)
Erroneous - pt feeling better, with OTC med taken this morning. Will call back if needed.  Claretta Fraise, MD

## 2022-01-30 NOTE — Telephone Encounter (Signed)
Angela Johnson,  This pt had a virtual visit with Dr. Lajuana Ripple on 10/20 for HA, uncontrolled BP. She has now tested positive for Covid on 10/29 with a home test.  Would you be alright sending in a cough medication or does pt need to be seen again.

## 2022-02-10 ENCOUNTER — Telehealth: Payer: Self-pay | Admitting: *Deleted

## 2022-02-10 ENCOUNTER — Encounter: Payer: Self-pay | Admitting: Neurology

## 2022-02-10 ENCOUNTER — Other Ambulatory Visit: Payer: Self-pay | Admitting: *Deleted

## 2022-02-10 ENCOUNTER — Encounter: Payer: Self-pay | Admitting: Family Medicine

## 2022-02-10 DIAGNOSIS — E063 Autoimmune thyroiditis: Secondary | ICD-10-CM

## 2022-02-10 MED ORDER — LEVOTHYROXINE SODIUM 100 MCG PO TABS: 100.0000 ug | ORAL_TABLET | Freq: Every day | ORAL | 1 refills | Status: DC

## 2022-02-10 NOTE — Telephone Encounter (Signed)
Pt needs an initial auto-pap appointment scheduled approximately 80 days from today's date. Please give her a call and schedule, thanks!

## 2022-02-10 NOTE — Telephone Encounter (Signed)
Autopap referral faxed to [facility]. Received a receipt of confirmation.  

## 2022-02-11 NOTE — Telephone Encounter (Signed)
Received message from billing dept. Pt will call back at the end of the month to pay on balance before scheduling another appt.

## 2022-03-03 ENCOUNTER — Encounter (INDEPENDENT_AMBULATORY_CARE_PROVIDER_SITE_OTHER): Payer: Self-pay | Admitting: Family Medicine

## 2022-03-04 ENCOUNTER — Encounter (INDEPENDENT_AMBULATORY_CARE_PROVIDER_SITE_OTHER): Payer: BC Managed Care – PPO | Admitting: Family Medicine

## 2022-03-22 ENCOUNTER — Other Ambulatory Visit: Payer: Self-pay | Admitting: Nurse Practitioner

## 2022-03-22 DIAGNOSIS — F32A Depression, unspecified: Secondary | ICD-10-CM

## 2022-03-22 DIAGNOSIS — F410 Panic disorder [episodic paroxysmal anxiety] without agoraphobia: Secondary | ICD-10-CM

## 2022-03-22 DIAGNOSIS — F411 Generalized anxiety disorder: Secondary | ICD-10-CM

## 2022-03-22 MED ORDER — BUSPIRONE HCL 15 MG PO TABS: 15.0000 mg | ORAL_TABLET | Freq: Two times a day (BID) | ORAL | 0 refills | Status: DC

## 2022-03-22 MED ORDER — CITALOPRAM HYDROBROMIDE 40 MG PO TABS: 40.0000 mg | ORAL_TABLET | Freq: Every day | ORAL | 0 refills | Status: DC

## 2022-03-22 NOTE — Progress Notes (Signed)
I received phone call from on call service that patient was out of medication and was very anxious.On call services stated that medication was sent by PCP to a different pharmacy and patient was unable to pick up the medication.  I called patient to clarify since patient had not been seen for anxiety or depression in a few months.there are no notes in the computer showing medication refills in the last month or two.  Patient states she had not been seen and just needed her medication since she was out for 2 days. Per Patient she was unable to pick up medication refills from her old pharmacy due to insurance issues. I refilled patients medication for one month. Patient knows to call the office to schedule follow up appointment.

## 2022-04-14 ENCOUNTER — Other Ambulatory Visit: Payer: Self-pay | Admitting: Nurse Practitioner

## 2022-04-14 DIAGNOSIS — F411 Generalized anxiety disorder: Secondary | ICD-10-CM

## 2022-04-30 ENCOUNTER — Encounter: Payer: Self-pay | Admitting: Family Medicine

## 2022-05-16 DIAGNOSIS — Z124 Encounter for screening for malignant neoplasm of cervix: Secondary | ICD-10-CM | POA: Diagnosis not present

## 2022-05-16 DIAGNOSIS — Z01419 Encounter for gynecological examination (general) (routine) without abnormal findings: Secondary | ICD-10-CM | POA: Diagnosis not present

## 2022-05-16 DIAGNOSIS — Z6841 Body Mass Index (BMI) 40.0 and over, adult: Secondary | ICD-10-CM | POA: Diagnosis not present

## 2022-05-21 DIAGNOSIS — E8881 Metabolic syndrome: Secondary | ICD-10-CM | POA: Diagnosis not present

## 2022-05-21 DIAGNOSIS — F411 Generalized anxiety disorder: Secondary | ICD-10-CM | POA: Diagnosis not present

## 2022-05-21 DIAGNOSIS — M5416 Radiculopathy, lumbar region: Secondary | ICD-10-CM | POA: Diagnosis not present

## 2022-05-23 ENCOUNTER — Other Ambulatory Visit (HOSPITAL_COMMUNITY): Payer: Self-pay | Admitting: General Surgery

## 2022-05-27 ENCOUNTER — Encounter (HOSPITAL_COMMUNITY)
Admission: RE | Admit: 2022-05-27 | Discharge: 2022-05-27 | Disposition: A | Discharge status: Home / Self Care | Payer: Self-pay | Source: Ambulatory Visit | Attending: General Surgery | Admitting: General Surgery

## 2022-05-27 DIAGNOSIS — Z01818 Encounter for other preprocedural examination: Secondary | ICD-10-CM

## 2022-05-29 DIAGNOSIS — F5089 Other specified eating disorder: Secondary | ICD-10-CM | POA: Diagnosis not present

## 2022-06-02 ENCOUNTER — Encounter: Payer: BC Managed Care – PPO | Attending: General Surgery | Admitting: Dietician

## 2022-06-02 ENCOUNTER — Encounter: Payer: Self-pay | Admitting: Dietician

## 2022-06-02 VITALS — Ht 69.0 in | Wt >= 6400 oz

## 2022-06-02 DIAGNOSIS — F5089 Other specified eating disorder: Secondary | ICD-10-CM | POA: Diagnosis not present

## 2022-06-02 DIAGNOSIS — Z713 Dietary counseling and surveillance: Secondary | ICD-10-CM | POA: Insufficient documentation

## 2022-06-02 DIAGNOSIS — E669 Obesity, unspecified: Secondary | ICD-10-CM | POA: Diagnosis not present

## 2022-06-02 DIAGNOSIS — Z6841 Body Mass Index (BMI) 40.0 and over, adult: Secondary | ICD-10-CM | POA: Diagnosis not present

## 2022-06-02 NOTE — Progress Notes (Signed)
Nutrition Assessment for Bariatric Surgery: Pre-Surgery Behavioral and Nutrition Intervention Program   Medical Nutrition Therapy  Appt Start Time: 3:17    End Time: 4:30  Patient was seen on 06/02/2022 for Pre-Operative Nutrition Assessment. Purpose of todays visit  enhance perioperative outcomes along with a healthy weight maintenance   Referral stated Supervised Weight Loss (SWL) visits needed: 0  Not cleared at this time:  Pt to follow up for minimum of one more visit to assist pt with progressing through stages of change/further nutrition education. RD advised pt that this follow up visit is not mandated through insurance. Pt verbalized agreement.   Planned surgery: RYGB Pt expectation of surgery: to be 89; will take anything at this point to feel better.   NUTRITION ASSESSMENT   Anthropometrics  Start weight at [facility]: 407.0 lbs (date: 06/02/2022)  Height: 69 in BMI: 60.10 kg/m2     Clinical   Pharmacotherapy: History of weight loss medication used: n/a  Medical hx: hypercholesterolemia, obesity, hyperthyroidism, HTN, GERD Medications: levothyroxine, lo ovral, Celexa, bupropion, buspirone, allergy med (Claritin), hydroxyzine Labs: no recent labs in EMR; pt states she had labs done today at [facility]. Notable signs/symptoms: none noted Any previous deficiencies? 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  Pt states she likes to walk and exercise, stating it is painful right now with excess weight. Pt states she is not a sweet eater, stating she views fruit as sweets.  Pt states she may have a smoothie once in a while. Pt states she gets about 120 ounces of water (cirkul) per day. Pt states  she had IBS growing up, stating she has out grown it some.  Pt states salads give her stomach problems. Pt agreeable to logging to try to pinpoint what is giving her stomach pain and diarrhea.  Current Physical Activity Recommendations state 150 minutes per week of moderate to vigorous movement including Cardio and 1-2 days of resistance activities as well as flexibility/balance activities:  Pts current physical activity: 0, with 0% recommendation reached   Sleep Hygiene: duration and quality: pretty good; sleep study done with mild sleep apnea, stating she did not have to go on a CPAP machine; 6 hours a night  Current Patient Perceived Stress Level as stated by pt on a scale of 1-10:  8       Stress Management Techniques: pray, breathe, eat when stressed sometimes.  According to the Dietary Guidelines for Americans Recommendation: equivalent 1.5-2 cups fruits per day, equivalent 2-3 cups vegetables per day and at least half all grains whole  Fruit servings per day (on average): 0-1, meeting 20% recommendation  Non-starchy vegetable servings per day (on average): 2, meeting 66-100% recommendation  Whole Grains per day (on average): 2  Number of meals missed/skipped per week out of 21: 7  24-Hr Dietary Recall First Meal: skip or eggs or peanut butter toast or oatmeal with hone and chocolate or smoothie or protein shake (chocolate or vanilla) or egg salad with cottage cheese and mustard Snack:  Second Meal: skip when remote at home; pizza (favorite food ever); or salad and baked potato or chicken salad chick Snack:  Third Meal: cook at home; spaghetti or other pasta or tacos or grilled chicken with veggies or meatloaf  with two sides (one starch one not) Snack: rarely ice cream or crackers or toasted chips Beverages: water with flavorings, coffee, diet/zero soda (cutting back to one or two a day, stating used to have 6-12 a day), sweet tea, milk  Alcoholic beverages per week: 0   Estimated  Energy Needs Calories: 1600   NUTRITION DIAGNOSIS  Overweight/obesity ([place]-3.3) related to past poor dietary habits and physical inactivity as evidenced by patient w/ planned RYGB surgery following dietary guidelines for continued weight loss.    NUTRITION INTERVENTION  Nutrition counseling (C-1) and education (E-2) to facilitate bariatric surgery goals.  Educated pt on micronutrient deficiencies post-surgery and behavioral/dietary strategies to start in order to mitigate that risk   Behavioral and Dietary Interventions Pre-Op Goals Reviewed with the Patient Nutrition: Healthy Eating Behaviors Switch to non-caloric, non-carbonated and non-caffeinated beverages such as  water, unsweetened tea, Crystal Light and zero calorie beverages (aim for 64 oz. per day) Cut out grazing between meals or at night  Find a protein shake you like Eat every 3-5 hours        Eliminate distractions while eating (TV, computer, reading, driving, texting) Take 20-30 minutes to eat a meal; practice not drinking with meals. Decrease high sugar foods/decrease high fat/fried foods Eliminate alcoholic beverages Increase/track protein intake (eggs, fish, chicken, yogurt) before surgery Eat non starchy vegetables 2 times a day 7 days a week Eat complex carbohydrates such as whole grains and fruits   Behavioral Modification: Physical Activity Increase my usual daily activity (use stairs, park farther, etc.) Engage in __________walk_____________  activity  ___5-10____ minutes ____7__ times per week  Other:    _________________________________________________________________     Problem Solving I will think about my usual eating patterns and how to tweak them How can my friends and family support me Barriers to starting my changes Learn and understand appetite verses hunger   Healthy Coping Allow for ___________ activities per week to help me manage stress Reframe negative thoughts I will keep a picture of  someone or something that is my inspiration & look at it daily   Monitoring  Weigh myself once a week  Measure my progress by monitoring how my clothes fit Keep a food record of what I eat and drink for the next ________ (time period) Take pictures of what I eat and drink for the next ________ (time period) Use an app to count steps/day for the next_______ (time period) Measure my progress such as increased energy and more restful sleep Monitor your acid reflux and bowel habits, are they getting better?   *goals in bold above  Handouts Provided Include  Bariatric Surgery handouts (Nutrition Visits, Pre Surgery Behavioral Change Goals, Protein Shakes Brands to Choose From, Vitamins & Mineral Supplementation)  Learning Style & Readiness for Change Teaching method utilized: Visual, Auditory, and hands on  Demonstrated degree of understanding via: Teach Back  Readiness Level: preparation Barriers to learning/adherence to lifestyle change: nothing identified  RD's Notes for Next Visit Patient progress toward chosen goals.   MONITORING & EVALUATION Dietary intake, weekly physical activity, body weight, and preoperative behavioral change goals   Next Steps  Patient is to follow up at [facility] in two weeks for further evaluation and progress toward chosen goals.

## 2022-06-11 ENCOUNTER — Encounter (HOSPITAL_COMMUNITY)
Admission: RE | Admit: 2022-06-11 | Discharge: 2022-06-11 | Disposition: A | Discharge status: Home / Self Care | Payer: BC Managed Care – PPO | Source: Ambulatory Visit | Attending: General Surgery | Admitting: General Surgery

## 2022-06-11 ENCOUNTER — Other Ambulatory Visit: Payer: Self-pay

## 2022-06-11 ENCOUNTER — Ambulatory Visit (HOSPITAL_COMMUNITY)
Admission: RE | Admit: 2022-06-11 | Discharge: 2022-06-11 | Disposition: A | Discharge status: Home / Self Care | Payer: BC Managed Care – PPO | Source: Ambulatory Visit | Attending: General Surgery | Admitting: General Surgery

## 2022-06-11 DIAGNOSIS — K219 Gastro-esophageal reflux disease without esophagitis: Secondary | ICD-10-CM | POA: Diagnosis not present

## 2022-06-11 DIAGNOSIS — Z01818 Encounter for other preprocedural examination: Secondary | ICD-10-CM | POA: Insufficient documentation

## 2022-06-11 DIAGNOSIS — K449 Diaphragmatic hernia without obstruction or gangrene: Secondary | ICD-10-CM | POA: Diagnosis not present

## 2022-06-16 ENCOUNTER — Ambulatory Visit: Payer: Self-pay | Admitting: Dietician

## 2022-06-24 ENCOUNTER — Encounter: Payer: Self-pay | Admitting: Dietician

## 2022-06-24 ENCOUNTER — Encounter: Payer: BC Managed Care – PPO | Admitting: Dietician

## 2022-06-24 VITALS — Ht 69.0 in | Wt >= 6400 oz

## 2022-06-24 DIAGNOSIS — E669 Obesity, unspecified: Secondary | ICD-10-CM

## 2022-06-24 DIAGNOSIS — Z6841 Body Mass Index (BMI) 40.0 and over, adult: Secondary | ICD-10-CM | POA: Diagnosis not present

## 2022-06-24 DIAGNOSIS — Z713 Dietary counseling and surveillance: Secondary | ICD-10-CM | POA: Diagnosis not present

## 2022-06-24 NOTE — Progress Notes (Signed)
Supervised Weight Loss Visit Bariatric Nutrition Education Appt Start Time: 4:45    End Time: 5:25  Planned surgery: RYGB Pt expectation of surgery: to be 89; will take anything at this point to feel better.  Referral stated Supervised Weight Loss (SWL) visits needed: 0  Pt completed visits.   Pt has cleared nutrition requirements.   NUTRITION ASSESSMENT  Anthropometrics  Start weight at [facility]: 407.0 lbs (date: 06/02/2022)  Height: 69 in Weight today: 407.2 lbs BMI: 60.13 kg/m2     Clinical  History of weight loss medication used: n/a Chief Operating Officer cover) Medical hx: hypercholesterolemia, obesity, hyperthyroidism, HTN, GERD Medications: levothyroxine, lo ovral, Celexa, bupropion, buspirone, allergy med (Claritin), hydroxyzine, Vit D Labs: A1c 5.8; Vit D 11.9; iron saturation 13; TSH 6.470; chol 232; triglycerides 299; HDL 37; LDL 141 Notable signs/symptoms: none noted Any previous deficiencies? No  Lifestyle & Dietary Hx  Pt states she is doing well with not drinking with her meals, stating she is still struggling with getting rid of distractions. Pt states she is doing well with slowing down when she eats and being more mindful. Pt states she tried some protein shakes to see which ones she likes. Pt states she is meal prepping, stating it is a big step for her. Pt states she ordered some sectioned plates for meal prepping. Pt states she has cut back on sugar in her tea, stating she is trying minute maid zero sugar fruit punch. Pt states she has been prescribed Vit D.  Estimated daily fluid intake: 100 oz Supplements: n/a Current average weekly physical activity: walk 5-10 minutes a day several days a week (goal is daily)  24-Hr Dietary Recall First Meal: eggs and sausage or peanut butter toast or oatmeal with honey and chocolate  Snack: Protein shake Second Meal: pizza (favorite food ever); or salad and baked potato or chicken salad chick or burger at home with  fries; take out, something quick Snack:  Third Meal: cook at home; spaghetti or other pasta or tacos or grilled chicken with veggies or meatloaf with two sides (one starch one not) or two eggs or chicken and rice, zucchini  Snack: rarely ice cream or crackers or toasted chips Beverages: water with flavorings, coffee, diet/zero soda (cutting back to one or two a day, stating used to have 6-12 a day), sweet tea, milk  Estimated Energy Needs Calories: 1600  NUTRITION DIAGNOSIS  Overweight/obesity ([place]-3.3) related to past poor dietary habits and physical inactivity as evidenced by patient w/ planned RYGB surgery following dietary guidelines for continued weight loss.   NUTRITION INTERVENTION  Nutrition counseling (C-1) and education (E-2) to facilitate bariatric surgery goals.  Encouraged patient to honor their body's internal hunger and fullness cues.  Throughout the day, check in mentally and rate hunger. Stop eating when satisfied not full regardless of how much food is left on the plate.  Get more if still hungry 20-30 minutes later.  The key is to honor satisfaction so throughout the meal, rate fullness factor and stop when comfortably satisfied not physically full. The key is to honor hunger and fullness without any feelings of guilt or shame.  Pay attention to what the internal cues are, rather than any external factors. This will enhance the confidence you have in listening to your own body and following those internal cues enabling you to increase how often you eat when you are hungry not out of appetite and stop when you are satisfied not full.  Encouraged pt to continue to  eat balanced meals inclusive of non starchy vegetables 2 times a day 7 days a week Encouraged pt to choose lean protein sources: limiting beef, pork, sausage, hotdogs, and lunch meat Encourage pt to choose healthy fats such as plant based limiting animal fats Encouraged pt to continue to drink a minium 64 fluid ounces  with half being plain water to satisfy proper hydration    Pre-Op Goals Progress & New Goals Continue: practice not drinking with meals; slow down at meal time; chew well; get rid of distractions Continue: increase physical activity, aim for 150 minutes of moderate exercise Continue: avoid skipping meals; eat every 3-5 hours  Handouts Provided Include  Bariatric MyPlate  Learning Style & Readiness for Change Teaching method utilized: Visual & Auditory  Demonstrated degree of understanding via: Teach Back  Readiness Level: Preparation Barriers to learning/adherence to lifestyle change: nothing identified  RD's Notes for next Visit    MONITORING & EVALUATION Dietary intake, weekly physical activity, body weight, and pre-op goals in 1 month.   Next Steps  Pt has completed visits. No further supervised visits required/recommended. Patient is to return to [facility] for pre-op class >2 week prior to scheduled surgery.

## 2022-07-07 ENCOUNTER — Ambulatory Visit: Payer: BC Managed Care – PPO

## 2022-07-08 ENCOUNTER — Other Ambulatory Visit: Payer: Self-pay | Admitting: Family Medicine

## 2022-07-08 DIAGNOSIS — F410 Panic disorder [episodic paroxysmal anxiety] without agoraphobia: Secondary | ICD-10-CM

## 2022-07-16 DIAGNOSIS — I10 Essential (primary) hypertension: Secondary | ICD-10-CM | POA: Diagnosis not present

## 2022-07-16 DIAGNOSIS — G4733 Obstructive sleep apnea (adult) (pediatric): Secondary | ICD-10-CM | POA: Diagnosis not present

## 2022-07-16 DIAGNOSIS — E8881 Metabolic syndrome: Secondary | ICD-10-CM | POA: Diagnosis not present

## 2022-07-17 NOTE — Progress Notes (Signed)
Sent message, via epic in basket, requesting orders in epic from surgeon.  

## 2022-07-21 ENCOUNTER — Encounter: Payer: BC Managed Care – PPO | Attending: General Surgery | Admitting: Skilled Nursing Facility1

## 2022-07-21 VITALS — Wt 394.5 lb

## 2022-07-21 DIAGNOSIS — E669 Obesity, unspecified: Secondary | ICD-10-CM | POA: Diagnosis not present

## 2022-07-22 ENCOUNTER — Encounter: Payer: Self-pay | Admitting: Skilled Nursing Facility1

## 2022-07-22 NOTE — Progress Notes (Signed)
Pre-Operative Nutrition Class:    Patient was seen on 07/21/2022 for Pre-Operative Bariatric Surgery Education [facility]    Surgery date: 07/28/2022 Surgery type: RYGB Start weight at [facility]: 407 Weight today: 394.5  The following the learning objectives were met by the patient during this course: Identify Pre-Op Dietary Goals and will begin 2 weeks pre-operatively Identify appropriate sources of fluids and proteins  State protein recommendations and appropriate sources pre and post-operatively Identify Post-Operative Dietary Goals and will follow for 2 weeks post-operatively Identify appropriate multivitamin and calcium sources Describe the need for physical activity post-operatively and will follow MD recommendations State when to call healthcare provider regarding medication questions or post-operative complications When having a diagnosis of diabetes understanding hypoglycemia symptoms and the inclusion of 1 complex carbohydrate per meal  Handouts given during class include: Pre-Op Bariatric Surgery Diet Handout Protein Shake Handout Post-Op Bariatric Surgery Nutrition Handout BELT Program Information Flyer Support Group Information Flyer [facility] Bariatric Supplements Price List  Follow-Up Plan: Patient will follow-up at [facility] 2 weeks post operatively for diet advancement per MD.

## 2022-07-23 ENCOUNTER — Other Ambulatory Visit: Payer: Self-pay | Admitting: Family Medicine

## 2022-07-23 DIAGNOSIS — F410 Panic disorder [episodic paroxysmal anxiety] without agoraphobia: Secondary | ICD-10-CM

## 2022-07-23 NOTE — Progress Notes (Signed)
Surgery orders requested via Epic inbox. °

## 2022-07-23 NOTE — Patient Instructions (Addendum)
SURGICAL WAITING ROOM VISITATION  Patients having surgery or a procedure may have no more than 2 support people in the waiting area - these visitors may rotate.    Children under the age of 43 must have an adult with them who is not the patient.  Due to an increase in RSV and influenza rates and associated hospitalizations, children ages 30 and under may not visit patients in [facility] hospitals.  If the patient needs to stay at the hospital during part of their recovery, the visitor guidelines for inpatient rooms apply. Pre-op nurse will coordinate an appropriate time for 1 support person to accompany patient in pre-op.  This support person may not rotate.    Please refer to the [facility] website for the visitor guidelines for Inpatients (after your surgery is over and you are in a regular room).       Your procedure is scheduled on: 07/28/22   Report to [facility] Main Entrance    Report to admitting at 6:15 AM   Call this number if you have problems the morning of surgery [phone]   Do not eat food :After 6:00 PM .   After Midnight you may have the following liquids until 5:30 AM DAY OF SURGERY  Water Non-Citrus Juices (without pulp, NO RED-Apple, White grape, White cranberry) Black Coffee (NO MILK/CREAM OR CREAMERS, sugar ok)  Clear Tea (NO MILK/CREAM OR CREAMERS, sugar ok) regular and decaf                             Plain Jell-O (NO RED)                                           Fruit ices (not with fruit pulp, NO RED)                                     Popsicles (NO RED)                                                               Sports drinks like Gatorade (NO RED)                   The day of surgery:  Drink ONE (1) Pre-Surgery G2 at 5:30  AM the morning of surgery. Drink in one sitting. Do not sip.  This drink was given to you during your hospital  pre-op appointment visit. Nothing else to drink after completing the Pre-Surgery G2.           If you have questions, please contact your surgeon's office.   FOLLOW  ANY ADDITIONAL PRE OP INSTRUCTIONS YOU RECEIVED FROM YOUR SURGEON'S OFFICE!!!     Oral Hygiene is also important to reduce your risk of infection.                                    Remember - BRUSH YOUR TEETH THE MORNING OF SURGERY WITH YOUR REGULAR TOOTHPASTE  DENTURES WILL BE REMOVED PRIOR TO SURGERY PLEASE DO NOT APPLY "Poly grip" OR ADHESIVES!!!   Do NOT smoke after Midnight   Take these medicines the morning of surgery with A SIP OF WATER:   Wellbutrin  Buspirone  Zyrtec  Citaprolam  Synthroid  Protonix  Hydroxyzine  Okay to use inhalers  Bring CPAP mask and tubing day of surgery.                              You may not have any metal on your body including hair pins, jewelry, and body piercing             Do not wear make-up, lotions, powders, perfumes/cologne, or deodorant  Do not wear nail polish including gel and S&S, artificial/acrylic nails, or any other type of covering on natural nails including finger and toenails. If you have artificial nails, gel coating, etc. that needs to be removed by a nail salon please have this removed prior to surgery or surgery may need to be canceled/ delayed if the surgeon/ anesthesia feels like they are unable to be safely monitored.   Do not shave  48 hours prior to surgery.            Do not bring valuables to the hospital. [facility] IS NOT RESPONSIBLE   FOR VALUABLES.   Contacts, glasses, dentures or bridgework may not be worn into surgery.   Bring small overnight bag day of surgery.   DO NOT BRING YOUR HOME MEDICATIONS TO THE HOSPITAL. PHARMACY WILL DISPENSE MEDICATIONS LISTED ON YOUR MEDICATION LIST TO YOU DURING YOUR ADMISSION IN THE HOSPITAL!   Special Instructions: Bring a copy of your healthcare power of attorney and living will documents the day of surgery if you haven't scanned them before.              Please read over the following fact  sheets you were given: IF YOU HAVE QUESTIONS ABOUT YOUR PRE-OP INSTRUCTIONS PLEASE CALL [phone]   If you received a COVID test during your pre-op visit  it is requested that you wear a mask when out in public, stay away from anyone that may not be feeling well and notify your surgeon if you develop symptoms. If you test positive for Covid or have been in contact with anyone that has tested positive in the last 10 days please notify you surgeon.    [place] - Preparing for Surgery Before surgery, you can play an important role.  Because skin is not sterile, your skin needs to be as free of germs as possible.  You can reduce the number of germs on your skin by washing with CHG (chlorahexidine gluconate) soap before surgery.  CHG is an antiseptic cleaner which kills germs and bonds with the skin to continue killing germs even after washing. Please DO NOT use if you have an allergy to CHG or antibacterial soaps.  If your skin becomes reddened/irritated stop using the CHG and inform your nurse when you arrive at Short Stay. Do not shave (including legs and underarms) for at least 48 hours prior to the first CHG shower.  You may shave your face/neck.  Please follow these instructions carefully:  1.  Shower with CHG Soap the night before surgery and the  morning of surgery.  2.  If you choose to wash your hair, wash your hair first as usual with your normal  shampoo.  3.  After you shampoo, rinse your hair and body thoroughly to remove the shampoo.                             4.  Use CHG as you would any other liquid soap.  You can apply chg directly to the skin and wash.  Gently with a scrungie or clean washcloth.  5.  Apply the CHG Soap to your body ONLY FROM THE NECK DOWN.   Do   not use on face/ open                           Wound or open sores. Avoid contact with eyes, ears mouth and   genitals (private parts).                       Wash face,  Genitals (private parts) with your normal  soap.             6.  Wash thoroughly, paying special attention to the area where your    surgery  will be performed.  7.  Thoroughly rinse your body with warm water from the neck down.  8.  DO NOT shower/wash with your normal soap after using and rinsing off the CHG Soap.                9.  Pat yourself dry with a clean towel.            10.  Wear clean pajamas.            11.  Place clean sheets on your bed the night of your first shower and do not  sleep with pets. Day of Surgery : Do not apply any lotions/deodorants the morning of surgery.  Please wear clean clothes to the hospital/surgery center.  FAILURE TO FOLLOW THESE INSTRUCTIONS MAY RESULT IN THE CANCELLATION OF YOUR SURGERY  PATIENT SIGNATURE_________________________________  NURSE SIGNATURE__________________________________  ________________________________________________________________________

## 2022-07-24 ENCOUNTER — Encounter (HOSPITAL_COMMUNITY): Payer: Self-pay

## 2022-07-24 ENCOUNTER — Other Ambulatory Visit: Payer: Self-pay

## 2022-07-24 ENCOUNTER — Ambulatory Visit: Payer: Self-pay | Admitting: General Surgery

## 2022-07-24 ENCOUNTER — Encounter (HOSPITAL_COMMUNITY)
Admission: RE | Admit: 2022-07-24 | Discharge: 2022-07-24 | Disposition: A | Discharge status: Home / Self Care | Payer: BC Managed Care – PPO | Source: Ambulatory Visit | Attending: General Surgery | Admitting: General Surgery

## 2022-07-24 VITALS — BP 127/78 | HR 74 | Temp 98.3°F | Resp 18 | Ht 69.0 in | Wt 392.0 lb

## 2022-07-24 DIAGNOSIS — Z01812 Encounter for preprocedural laboratory examination: Secondary | ICD-10-CM | POA: Insufficient documentation

## 2022-07-24 DIAGNOSIS — K219 Gastro-esophageal reflux disease without esophagitis: Secondary | ICD-10-CM | POA: Diagnosis not present

## 2022-07-24 DIAGNOSIS — Z87891 Personal history of nicotine dependence: Secondary | ICD-10-CM | POA: Insufficient documentation

## 2022-07-24 DIAGNOSIS — I10 Essential (primary) hypertension: Secondary | ICD-10-CM | POA: Diagnosis not present

## 2022-07-24 DIAGNOSIS — E039 Hypothyroidism, unspecified: Secondary | ICD-10-CM | POA: Insufficient documentation

## 2022-07-24 DIAGNOSIS — R7303 Prediabetes: Secondary | ICD-10-CM | POA: Insufficient documentation

## 2022-07-24 DIAGNOSIS — Z01818 Encounter for other preprocedural examination: Secondary | ICD-10-CM

## 2022-07-24 LAB — HEMOGLOBIN A1C
Hgb A1c MFr Bld: 5.1 % (ref 4.8–5.6)
Mean Plasma Glucose: 99.67 mg/dL

## 2022-07-24 LAB — CBC
HCT: 42.1 % (ref 36.0–46.0)
Hemoglobin: 13.7 g/dL (ref 12.0–15.0)
MCH: 29.5 pg (ref 26.0–34.0)
MCHC: 32.5 g/dL (ref 30.0–36.0)
MCV: 90.5 fL (ref 80.0–100.0)
Platelets: 213 10*3/uL (ref 150–400)
RBC: 4.65 MIL/uL (ref 3.87–5.11)
RDW: 13.6 % (ref 11.5–15.5)
WBC: 7.5 10*3/uL (ref 4.0–10.5)
nRBC: 0 % (ref 0.0–0.2)

## 2022-07-24 LAB — BASIC METABOLIC PANEL
Anion gap: 12 (ref 5–15)
BUN: 17 mg/dL (ref 6–20)
CO2: 24 mmol/L (ref 22–32)
Calcium: 9.4 mg/dL (ref 8.9–10.3)
Chloride: 103 mmol/L (ref 98–111)
Creatinine, Ser: 0.89 mg/dL (ref 0.44–1.00)
GFR, Estimated: >60 mL/min (ref >60)
Glucose, Bld: 118 mg/dL — ABNORMAL HIGH (ref 70–99)
Potassium: 3.8 mmol/L (ref 3.5–5.1)
Sodium: 139 mmol/L (ref 135–145)

## 2022-07-24 LAB — GLUCOSE, CAPILLARY: Glucose-Capillary: 138 mg/dL — ABNORMAL HIGH (ref 70–99)

## 2022-07-24 NOTE — Pre-Procedure Instructions (Addendum)
Anesthesia note:  Bowel prep reminder:    PCP - Doylene Canard Cardiologist -none Other-   Chest x-ray - 06/11/22 EKG - 06/11/22 Stress Test - no ECHO - no Cardiac Cath - no CABG-no Pacemaker/ICD device last checked:NA  Sleep Study - yes mild CPAP - no  Pt is pre diabetic-yes CBG at PAT visit-138 Fasting Blood Sugar at home- Checks Blood Sugar _____  Blood Thinner:no Blood Thinner Instructions: Aspirin Instructions: Last Dose:  Anesthesia review: Yes  reason:  Patient denies shortness of breath, fever, cough and chest pain at PAT appointment. Pt gets SOB on 1 flight of stairs. Her BMI is 57.2. She reports that she doesn't take any BP medications. Her weight is 392 lbs  Patient verbalized understanding of instructions that were given to them at the PAT appointment. Patient was also instructed that they will need to review over the PAT instructions again at home before surgery. Pt called to verify her medications. I told her to stop Vit D I week prior. She said she didn't know to stop birth control 12 weeks prior. She called the surgeons office and they referred to anesthesia. It is ok that she stopped it 2 weeks prior.

## 2022-07-25 ENCOUNTER — Encounter (HOSPITAL_COMMUNITY): Payer: Self-pay | Admitting: General Surgery

## 2022-07-25 NOTE — Anesthesia Preprocedure Evaluation (Signed)
Anesthesia Evaluation  Patient identified by MRN, date of birth, ID band Patient awake    Reviewed: Allergy & Precautions, NPO status , Patient's Chart, lab work & pertinent test results  Airway Mallampati: IV  TM Distance: >3 FB Neck ROM: Full    Dental  (+) Dental Advisory Given   Pulmonary sleep apnea , former smoker   breath sounds clear to auscultation       Cardiovascular hypertension, Pt. on medications  Rhythm:Regular Rate:Normal     Neuro/Psych  Neuromuscular disease    GI/Hepatic Neg liver ROS,GERD  ,,  Endo/Other  Hypothyroidism  Morbid obesity  Renal/GU negative Renal ROS     Musculoskeletal  (+) Arthritis ,    Abdominal   Peds  Hematology negative hematology ROS (+)   Anesthesia Other Findings   Reproductive/Obstetrics                             Anesthesia Physical Anesthesia Plan  ASA: 3  Anesthesia Plan: General   Post-op Pain Management: Tylenol PO (pre-op)* and Ketamine IV*   Induction: Intravenous  PONV Risk Score and Plan: 4 or greater and Dexamethasone, Ondansetron, Midazolam, Treatment may vary due to age or medical condition and Scopolamine patch - Pre-op  Airway Management Planned: Oral ETT  Additional Equipment:   Intra-op Plan:   Post-operative Plan: Extubation in OR  Informed Consent: I have reviewed the patients History and Physical, chart, labs and discussed the procedure including the risks, benefits and alternatives for the proposed anesthesia with the patient or authorized representative who has indicated his/her understanding and acceptance.     Dental advisory given  Plan Discussed with: CRNA  Anesthesia Plan Comments:         Anesthesia Quick Evaluation

## 2022-07-25 NOTE — Progress Notes (Signed)
Case: [id number] Date/Time: 07/28/22 0815   Procedures:      LAPAROSCOPIC ROUX-EN-Y GASTRIC BYPASS WITH UPPER ENDOSCOPY     HERNIA REPAIR HIATAL   Anesthesia type: General   Pre-op diagnosis: MORBID OBESITY   Location: [facility] ROOM 01 / [facility]   Surgeons: Gaynelle Adu, MD       DISCUSSION: Angela Johnson is a 37 yo female with morbid obesity (BMI 57) who presents for surgery listed above with Dr. Andrey Campanile. Other PMH includes former smoker (quit 2006), pre-diabetes, OSA (on CPAP), GERD, hypothyroidism, palpitations, remote hx of drug use (crack cocaine and marijuana). No prior anesthesia complications although only other anesthesia encounter was for an epidural.  VS: BP 127/78   Pulse 74   Temp 36.8 C (Oral)   Resp 18   Ht 5\' 9"  (1.753 m)   Wt (!) 177.8 kg   LMP 07/09/2022   SpO2 98%   BMI 57.89 kg/m   PROVIDERS: Raliegh Ip, DO   LABS: Labs reviewed: Acceptable for surgery. (all labs ordered are listed, but only abnormal results are displayed)  Labs Reviewed  BASIC METABOLIC PANEL - Abnormal; Notable for the following components:      Result Value   Glucose, Bld 118 (*)    All other components within normal limits  GLUCOSE, CAPILLARY - Abnormal; Notable for the following components:   Glucose-Capillary 138 (*)    All other components within normal limits  HEMOGLOBIN A1C  CBC     IMAGES:  CXR 06/11/22:  IMPRESSION: No active cardiopulmonary disease.  UGI series:  IMPRESSION: 1. Normal anatomy of the esophagus, stomach and duodenum. 2. Tiny hiatal hernia. 3. Mild gastroesophageal reflux.   EKG 06/11/22:  NSR   CV: n/a  Past Medical History:  Diagnosis Date   Alcohol abuse    history   Anxiety    Arthritis    Back pain    Depression    Drug abuse (HCC)    history   GERD (gastroesophageal reflux disease)    Gonorrhea 03/31/2005   Treated   Hypothyroidism    IBS (irritable bowel syndrome)    Other hyperlipidemia    Palpitations     Pre-diabetes    SOB (shortness of breath)    due to weight   Vitamin D deficiency     Past Surgical History:  Procedure Laterality Date   oral     wisdom teeth    MEDICATIONS:  buPROPion (WELLBUTRIN XL) 300 MG 24 hr tablet   busPIRone (BUSPAR) 15 MG tablet   cetirizine (ZYRTEC) 10 MG tablet   Cholecalciferol (VITAMIN D3 PO)   citalopram (CELEXA) 40 MG tablet   fluticasone (FLONASE) 50 MCG/ACT nasal spray   hydrochlorothiazide (HYDRODIURIL) 25 MG tablet   hydrOXYzine (ATARAX) 25 MG tablet   levothyroxine (SYNTHROID) 100 MCG tablet   norgestrel-ethinyl estradiol (LO/OVRAL,CRYSELLE) 0.3-30 MG-MCG tablet   pantoprazole (PROTONIX) 40 MG tablet   No current facility-administered medications for this encounter.    Marcille Blanco [facility] Short Stay/Anesthesiology [facility] Phone [phone] 07/25/2022 8:52 AM

## 2022-07-28 ENCOUNTER — Other Ambulatory Visit: Payer: Self-pay

## 2022-07-28 ENCOUNTER — Inpatient Hospital Stay (HOSPITAL_COMMUNITY): Payer: BC Managed Care – PPO | Admitting: Anesthesiology

## 2022-07-28 ENCOUNTER — Inpatient Hospital Stay (HOSPITAL_COMMUNITY)
Admission: RE | Admit: 2022-07-28 | Discharge: 2022-07-29 | DRG: 621 | Disposition: A | Discharge status: Home / Self Care | Payer: BC Managed Care – PPO | Attending: General Surgery | Admitting: General Surgery

## 2022-07-28 ENCOUNTER — Encounter (HOSPITAL_COMMUNITY): Payer: Self-pay | Admitting: General Surgery

## 2022-07-28 ENCOUNTER — Encounter (HOSPITAL_COMMUNITY)
Admission: RE | Disposition: A | Discharge status: Home / Self Care | Payer: Self-pay | Source: Home / Self Care | Attending: General Surgery

## 2022-07-28 ENCOUNTER — Inpatient Hospital Stay (HOSPITAL_COMMUNITY): Payer: BC Managed Care – PPO | Admitting: Medical

## 2022-07-28 DIAGNOSIS — M5416 Radiculopathy, lumbar region: Secondary | ICD-10-CM | POA: Diagnosis not present

## 2022-07-28 DIAGNOSIS — Z881 Allergy status to other antibiotic agents status: Secondary | ICD-10-CM | POA: Diagnosis not present

## 2022-07-28 DIAGNOSIS — Z01818 Encounter for other preprocedural examination: Secondary | ICD-10-CM

## 2022-07-28 DIAGNOSIS — G4733 Obstructive sleep apnea (adult) (pediatric): Secondary | ICD-10-CM | POA: Diagnosis not present

## 2022-07-28 DIAGNOSIS — F411 Generalized anxiety disorder: Secondary | ICD-10-CM | POA: Diagnosis not present

## 2022-07-28 DIAGNOSIS — R7303 Prediabetes: Secondary | ICD-10-CM

## 2022-07-28 DIAGNOSIS — E781 Pure hyperglyceridemia: Secondary | ICD-10-CM | POA: Diagnosis present

## 2022-07-28 DIAGNOSIS — Z6841 Body Mass Index (BMI) 40.0 and over, adult: Secondary | ICD-10-CM | POA: Diagnosis not present

## 2022-07-28 DIAGNOSIS — Z91040 Latex allergy status: Secondary | ICD-10-CM | POA: Diagnosis not present

## 2022-07-28 DIAGNOSIS — E7800 Pure hypercholesterolemia, unspecified: Secondary | ICD-10-CM | POA: Diagnosis present

## 2022-07-28 DIAGNOSIS — E8881 Metabolic syndrome: Secondary | ICD-10-CM | POA: Diagnosis not present

## 2022-07-28 DIAGNOSIS — K625 Hemorrhage of anus and rectum: Secondary | ICD-10-CM

## 2022-07-28 DIAGNOSIS — Z79899 Other long term (current) drug therapy: Secondary | ICD-10-CM | POA: Diagnosis not present

## 2022-07-28 DIAGNOSIS — G4730 Sleep apnea, unspecified: Secondary | ICD-10-CM | POA: Diagnosis not present

## 2022-07-28 DIAGNOSIS — E063 Autoimmune thyroiditis: Secondary | ICD-10-CM | POA: Diagnosis present

## 2022-07-28 DIAGNOSIS — E559 Vitamin D deficiency, unspecified: Secondary | ICD-10-CM | POA: Diagnosis not present

## 2022-07-28 DIAGNOSIS — Z79890 Hormone replacement therapy (postmenopausal): Secondary | ICD-10-CM | POA: Diagnosis not present

## 2022-07-28 DIAGNOSIS — F419 Anxiety disorder, unspecified: Secondary | ICD-10-CM | POA: Diagnosis present

## 2022-07-28 DIAGNOSIS — I10 Essential (primary) hypertension: Principal | ICD-10-CM | POA: Diagnosis present

## 2022-07-28 DIAGNOSIS — Z9884 Bariatric surgery status: Secondary | ICD-10-CM

## 2022-07-28 HISTORY — PX: GASTRIC ROUX-EN-Y: SHX5262

## 2022-07-28 LAB — GLUCOSE, CAPILLARY
Glucose-Capillary: 155 mg/dL — ABNORMAL HIGH (ref 70–99)
Glucose-Capillary: 164 mg/dL — ABNORMAL HIGH (ref 70–99)
Glucose-Capillary: 190 mg/dL — ABNORMAL HIGH (ref 70–99)
Glucose-Capillary: 199 mg/dL — ABNORMAL HIGH (ref 70–99)

## 2022-07-28 LAB — CBC
HCT: 39.4 % (ref 36.0–46.0)
Hemoglobin: 12.9 g/dL (ref 12.0–15.0)
MCH: 29.5 pg (ref 26.0–34.0)
MCHC: 32.7 g/dL (ref 30.0–36.0)
MCV: 90.2 fL (ref 80.0–100.0)
Platelets: 196 10*3/uL (ref 150–400)
RBC: 4.37 MIL/uL (ref 3.87–5.11)
RDW: 13.3 % (ref 11.5–15.5)
WBC: 10.7 10*3/uL — ABNORMAL HIGH (ref 4.0–10.5)
nRBC: 0 % (ref 0.0–0.2)

## 2022-07-28 LAB — HEMOGLOBIN AND HEMATOCRIT, BLOOD
HCT: 36.9 % (ref 36.0–46.0)
Hemoglobin: 12.4 g/dL (ref 12.0–15.0)

## 2022-07-28 LAB — CREATININE, SERUM
Creatinine, Ser: 0.85 mg/dL (ref 0.44–1.00)
GFR, Estimated: >60 mL/min (ref >60)

## 2022-07-28 LAB — HCG, SERUM, QUALITATIVE: Preg, Serum: NEGATIVE

## 2022-07-28 SURGERY — LAPAROSCOPIC ROUX-EN-Y GASTRIC BYPASS WITH UPPER ENDOSCOPY
Anesthesia: General

## 2022-07-28 MED ORDER — CHLORHEXIDINE GLUCONATE 0.12 % MT SOLN: 15.0000 mL | Freq: Once | OROMUCOSAL | Status: DC

## 2022-07-28 MED ORDER — BUPIVACAINE LIPOSOME 1.3 % IJ SUSP
INTRAMUSCULAR | Status: AC
  Filled 2022-07-28: qty 20

## 2022-07-28 MED ORDER — LEVOTHYROXINE SODIUM 100 MCG PO TABS
100.0000 ug | ORAL_TABLET | Freq: Every day | ORAL | Status: DC
  Administered 2022-07-29: 100 ug via ORAL
  Filled 2022-07-28: qty 1

## 2022-07-28 MED ORDER — LACTATED RINGERS IV SOLN: INTRAVENOUS | Status: DC

## 2022-07-28 MED ORDER — KETAMINE HCL 10 MG/ML IJ SOLN
INTRAMUSCULAR | Status: DC | PRN
  Administered 2022-07-28: 20 mg via INTRAVENOUS
  Administered 2022-07-28: 30 mg via INTRAVENOUS

## 2022-07-28 MED ORDER — FENTANYL CITRATE (PF) 100 MCG/2ML IJ SOLN
INTRAMUSCULAR | Status: AC
  Filled 2022-07-28: qty 2

## 2022-07-28 MED ORDER — PROPOFOL 10 MG/ML IV BOLUS
INTRAVENOUS | Status: DC | PRN
  Administered 2022-07-28: 300 mg via INTRAVENOUS

## 2022-07-28 MED ORDER — PANTOPRAZOLE SODIUM 40 MG IV SOLR
40.0000 mg | Freq: Every day | INTRAVENOUS | Status: DC
  Administered 2022-07-28: 40 mg via INTRAVENOUS
  Filled 2022-07-28: qty 10

## 2022-07-28 MED ORDER — BUPIVACAINE LIPOSOME 1.3 % IJ SUSP: 20.0000 mL | Freq: Once | INTRAMUSCULAR | Status: DC

## 2022-07-28 MED ORDER — HEPARIN SODIUM (PORCINE) 5000 UNIT/ML IJ SOLN
5000.0000 [IU] | INTRAMUSCULAR | Status: AC
  Administered 2022-07-28: 5000 [IU] via SUBCUTANEOUS
  Filled 2022-07-28: qty 1

## 2022-07-28 MED ORDER — ONDANSETRON HCL 4 MG/2ML IJ SOLN
INTRAMUSCULAR | Status: DC | PRN
  Administered 2022-07-28 (×2): 4 mg via INTRAVENOUS

## 2022-07-28 MED ORDER — APREPITANT 40 MG PO CAPS
40.0000 mg | ORAL_CAPSULE | ORAL | Status: AC
  Administered 2022-07-28: 40 mg via ORAL
  Filled 2022-07-28: qty 1

## 2022-07-28 MED ORDER — DEXAMETHASONE SODIUM PHOSPHATE 4 MG/ML IJ SOLN: 4.0000 mg | INTRAMUSCULAR | Status: DC

## 2022-07-28 MED ORDER — EPHEDRINE SULFATE-NACL 50-0.9 MG/10ML-% IV SOSY
PREFILLED_SYRINGE | INTRAVENOUS | Status: DC | PRN
  Administered 2022-07-28: 5 mg via INTRAVENOUS
  Administered 2022-07-28: 10 mg via INTRAVENOUS

## 2022-07-28 MED ORDER — KCL IN DEXTROSE-NACL 20-5-0.45 MEQ/L-%-% IV SOLN
INTRAVENOUS | Status: DC
  Filled 2022-07-28 (×3): qty 1000

## 2022-07-28 MED ORDER — ACETAMINOPHEN 160 MG/5ML PO SOLN
1000.0000 mg | Freq: Three times a day (TID) | ORAL | Status: DC
  Filled 2022-07-28: qty 40.6

## 2022-07-28 MED ORDER — FENTANYL CITRATE (PF) 100 MCG/2ML IJ SOLN
INTRAMUSCULAR | Status: DC | PRN
  Administered 2022-07-28: 100 ug via INTRAVENOUS
  Administered 2022-07-28: 50 ug via INTRAVENOUS

## 2022-07-28 MED ORDER — MIDAZOLAM HCL 5 MG/5ML IJ SOLN
INTRAMUSCULAR | Status: DC | PRN
  Administered 2022-07-28: 2 mg via INTRAVENOUS

## 2022-07-28 MED ORDER — ORAL CARE MOUTH RINSE: 15.0000 mL | Freq: Once | OROMUCOSAL | Status: DC

## 2022-07-28 MED ORDER — AMISULPRIDE (ANTIEMETIC) 5 MG/2ML IV SOLN: 10.0000 mg | Freq: Once | INTRAVENOUS | Status: DC | PRN

## 2022-07-28 MED ORDER — SCOPOLAMINE 1 MG/3DAYS TD PT72
1.0000 | MEDICATED_PATCH | TRANSDERMAL | Status: DC
  Administered 2022-07-28: 1.5 mg via TRANSDERMAL
  Filled 2022-07-28: qty 1

## 2022-07-28 MED ORDER — FENTANYL CITRATE PF 50 MCG/ML IJ SOSY
25.0000 ug | PREFILLED_SYRINGE | INTRAMUSCULAR | Status: DC | PRN
  Administered 2022-07-28 (×2): 50 ug via INTRAVENOUS

## 2022-07-28 MED ORDER — FENTANYL CITRATE PF 50 MCG/ML IJ SOSY
PREFILLED_SYRINGE | INTRAMUSCULAR | Status: AC
  Filled 2022-07-28: qty 1

## 2022-07-28 MED ORDER — DEXAMETHASONE SODIUM PHOSPHATE 10 MG/ML IJ SOLN
INTRAMUSCULAR | Status: DC | PRN
  Administered 2022-07-28: 10 mg via INTRAVENOUS

## 2022-07-28 MED ORDER — ENSURE MAX PROTEIN PO LIQD
2.0000 [oz_av] | ORAL | Status: DC
  Administered 2022-07-29 (×4): 2 [oz_av] via ORAL

## 2022-07-28 MED ORDER — SODIUM CHLORIDE 0.9 % IV SOLN: 12.5000 mg | Freq: Four times a day (QID) | INTRAVENOUS | Status: DC | PRN

## 2022-07-28 MED ORDER — CITALOPRAM HYDROBROMIDE 20 MG PO TABS
40.0000 mg | ORAL_TABLET | Freq: Every day | ORAL | Status: DC
  Administered 2022-07-29: 40 mg via ORAL
  Filled 2022-07-28: qty 2

## 2022-07-28 MED ORDER — HEPARIN SODIUM (PORCINE) 5000 UNIT/ML IJ SOLN
5000.0000 [IU] | Freq: Three times a day (TID) | INTRAMUSCULAR | Status: DC
  Administered 2022-07-28 – 2022-07-29 (×2): 5000 [IU] via SUBCUTANEOUS
  Filled 2022-07-28 (×2): qty 1

## 2022-07-28 MED ORDER — BUPIVACAINE HCL (PF) 0.25 % IJ SOLN
INTRAMUSCULAR | Status: DC | PRN
  Administered 2022-07-28: 30 mL

## 2022-07-28 MED ORDER — LIDOCAINE 2% (20 MG/ML) 5 ML SYRINGE
INTRAMUSCULAR | Status: DC | PRN
  Administered 2022-07-28: 80 mg via INTRAVENOUS

## 2022-07-28 MED ORDER — BUPIVACAINE HCL 0.25 % IJ SOLN
INTRAMUSCULAR | Status: AC
  Filled 2022-07-28: qty 1

## 2022-07-28 MED ORDER — SODIUM CHLORIDE 0.9 % IV SOLN
2.0000 g | INTRAVENOUS | Status: AC
  Administered 2022-07-28: 2 g via INTRAVENOUS
  Filled 2022-07-28: qty 2

## 2022-07-28 MED ORDER — CHLORHEXIDINE GLUCONATE 4 % EX SOLN: Freq: Once | CUTANEOUS | Status: DC

## 2022-07-28 MED ORDER — SIMETHICONE 80 MG PO CHEW
80.0000 mg | CHEWABLE_TABLET | Freq: Four times a day (QID) | ORAL | Status: DC | PRN
  Administered 2022-07-28 (×2): 80 mg via ORAL
  Filled 2022-07-28 (×2): qty 1

## 2022-07-28 MED ORDER — "VISTASEAL 4 ML SINGLE DOSE KIT "
4.0000 mL | PACK | Freq: Once | CUTANEOUS | Status: AC
  Administered 2022-07-28: 4 mL via TOPICAL
  Filled 2022-07-28: qty 4

## 2022-07-28 MED ORDER — ACETAMINOPHEN 500 MG PO TABS
1000.0000 mg | ORAL_TABLET | Freq: Three times a day (TID) | ORAL | Status: DC
  Administered 2022-07-28 – 2022-07-29 (×3): 1000 mg via ORAL
  Filled 2022-07-28 (×3): qty 2

## 2022-07-28 MED ORDER — FIBRIN SEALANT 2 ML SINGLE DOSE KIT
2.0000 mL | PACK | Freq: Once | CUTANEOUS | Status: AC
  Administered 2022-07-28: 2 mL via TOPICAL
  Filled 2022-07-28: qty 2

## 2022-07-28 MED ORDER — OXYCODONE HCL 5 MG/5ML PO SOLN
5.0000 mg | Freq: Four times a day (QID) | ORAL | Status: DC | PRN
  Administered 2022-07-28 – 2022-07-29 (×5): 5 mg via ORAL
  Filled 2022-07-28 (×5): qty 5

## 2022-07-28 MED ORDER — INSULIN ASPART 100 UNIT/ML IJ SOLN
0.0000 [IU] | INTRAMUSCULAR | Status: DC
  Administered 2022-07-28 (×2): 4 [IU] via SUBCUTANEOUS
  Administered 2022-07-29 (×2): 3 [IU] via SUBCUTANEOUS
  Administered 2022-07-29: 4 [IU] via SUBCUTANEOUS

## 2022-07-28 MED ORDER — MORPHINE SULFATE (PF) 2 MG/ML IV SOLN: 1.0000 mg | INTRAVENOUS | Status: DC | PRN

## 2022-07-28 MED ORDER — SUCCINYLCHOLINE CHLORIDE 200 MG/10ML IV SOSY
PREFILLED_SYRINGE | INTRAVENOUS | Status: DC | PRN
  Administered 2022-07-28: 140 mg via INTRAVENOUS

## 2022-07-28 MED ORDER — LACTATED RINGERS IR SOLN
Status: DC | PRN
  Administered 2022-07-28: 1000 mL

## 2022-07-28 MED ORDER — BUSPIRONE HCL 5 MG PO TABS
15.0000 mg | ORAL_TABLET | Freq: Two times a day (BID) | ORAL | Status: DC
  Administered 2022-07-29: 15 mg via ORAL
  Filled 2022-07-28: qty 1

## 2022-07-28 MED ORDER — ONDANSETRON HCL 4 MG/2ML IJ SOLN: 4.0000 mg | Freq: Four times a day (QID) | INTRAMUSCULAR | Status: DC | PRN

## 2022-07-28 MED ORDER — ROCURONIUM BROMIDE 10 MG/ML (PF) SYRINGE
PREFILLED_SYRINGE | INTRAVENOUS | Status: DC | PRN
  Administered 2022-07-28: 20 mg via INTRAVENOUS
  Administered 2022-07-28: 80 mg via INTRAVENOUS
  Administered 2022-07-28: 30 mg via INTRAVENOUS
  Administered 2022-07-28: 20 mg via INTRAVENOUS

## 2022-07-28 MED ORDER — ACETAMINOPHEN 500 MG PO TABS
1000.0000 mg | ORAL_TABLET | ORAL | Status: AC
  Administered 2022-07-28: 1000 mg via ORAL
  Filled 2022-07-28: qty 2

## 2022-07-28 MED ORDER — SUGAMMADEX SODIUM 200 MG/2ML IV SOLN
INTRAVENOUS | Status: DC | PRN
  Administered 2022-07-28: 350 mg via INTRAVENOUS

## 2022-07-28 MED ORDER — BUPIVACAINE LIPOSOME 1.3 % IJ SUSP
INTRAMUSCULAR | Status: DC | PRN
  Administered 2022-07-28: 20 mL

## 2022-07-28 SURGICAL SUPPLY — 103 items
ANTIFOG SOL W/FOAM PAD STRL (MISCELLANEOUS) ×1
APL LAPSCP 35 DL APL RGD (MISCELLANEOUS) ×2
APL PRP STRL LF DISP 70% ISPRP (MISCELLANEOUS) ×2
APL SWBSTK 6 STRL LF DISP (MISCELLANEOUS) ×1
APPLICATOR COTTON TIP 6 STRL (MISCELLANEOUS) ×2 IMPLANT
APPLICATOR COTTON TIP 6IN STRL (MISCELLANEOUS) ×1
APPLICATOR VISTASEAL 35 (MISCELLANEOUS) ×4 IMPLANT
APPLIER CLIP ROT 13.4 12 LRG (CLIP) ×1
APR CLP LRG 13.4X12 ROT 20 MLT (CLIP) ×1
BAG COUNTER SPONGE SURGICOUNT (BAG) IMPLANT
BAG SPNG CNTER NS LX DISP (BAG)
BLADE EXTENDED COATED 6.5IN (ELECTRODE) IMPLANT
BLADE HEX COATED 2.75 (ELECTRODE) ×2 IMPLANT
BLADE SURG SZ11 CARB STEEL (BLADE) ×2 IMPLANT
CABLE HIGH FREQUENCY MONO STRZ (ELECTRODE) IMPLANT
CHLORAPREP W/TINT 26 (MISCELLANEOUS) ×4 IMPLANT
CLIP APPLIE ROT 13.4 12 LRG (CLIP) IMPLANT
CLIP SUT LAPRA TY ABSORB (SUTURE) ×4 IMPLANT
COVER MAYO STAND STRL (DRAPES) IMPLANT
CUTTER FLEX LINEAR 45M (STAPLE) IMPLANT
DEVICE SUT QUICK LOAD TK 5 (SUTURE) IMPLANT
DEVICE SUT TI-KNOT TK 5X26 (SUTURE) IMPLANT
DEVICE SUTURE ENDOST 10MM (ENDOMECHANICALS) ×2 IMPLANT
DRAIN PENROSE 0.25X18 (DRAIN) ×2 IMPLANT
DRAIN PENROSE 0.5X18 (DRAIN) ×2 IMPLANT
DRAPE LAPAROSCOPIC ABDOMINAL (DRAPES) ×2 IMPLANT
DRAPE UTILITY XL STRL (DRAPES) ×2 IMPLANT
DRAPE WARM FLUID 44X44 (DRAPES) IMPLANT
DRSG TEGADERM 2-3/8X2-3/4 SM (GAUZE/BANDAGES/DRESSINGS) ×12 IMPLANT
ELECT REM PT RETURN 15FT ADLT (MISCELLANEOUS) ×2 IMPLANT
GAUZE 4X4 16PLY ~~LOC~~+RFID DBL (SPONGE) ×2 IMPLANT
GAUZE SPONGE 2X2 8PLY STRL LF (GAUZE/BANDAGES/DRESSINGS) ×2 IMPLANT
GAUZE SPONGE 4X4 12PLY STRL (GAUZE/BANDAGES/DRESSINGS) ×2 IMPLANT
GLOVE BIO SURGEON STRL SZ7.5 (GLOVE) ×2 IMPLANT
GLOVE INDICATOR 8.0 STRL GRN (GLOVE) ×2 IMPLANT
GOWN STRL REUS W/ TWL XL LVL3 (GOWN DISPOSABLE) ×8 IMPLANT
GOWN STRL REUS W/TWL XL LVL3 (GOWN DISPOSABLE) ×4
HANDLE SUCTION POOLE (INSTRUMENTS) IMPLANT
IRRIG SUCT STRYKERFLOW 2 WTIP (MISCELLANEOUS) ×1
IRRIGATION SUCT STRKRFLW 2 WTP (MISCELLANEOUS) ×2 IMPLANT
KIT BASIN OR (CUSTOM PROCEDURE TRAY) ×2 IMPLANT
KIT GASTRIC LAVAGE 34FR ADT (SET/KITS/TRAYS/PACK) ×2 IMPLANT
KIT TURNOVER KIT A (KITS) IMPLANT
MARKER SKIN DUAL TIP RULER LAB (MISCELLANEOUS) ×2 IMPLANT
MAT PREVALON FULL STRYKER (MISCELLANEOUS) ×2 IMPLANT
NDL SPNL 22GX3.5 QUINCKE BK (NEEDLE) ×2 IMPLANT
NEEDLE SPNL 22GX3.5 QUINCKE BK (NEEDLE) ×1 IMPLANT
NS IRRIG 1000ML POUR BTL (IV SOLUTION) ×2 IMPLANT
PACK CARDIOVASCULAR III (CUSTOM PROCEDURE TRAY) ×2 IMPLANT
PACK GENERAL/GYN (CUSTOM PROCEDURE TRAY) ×2 IMPLANT
RELOAD 45 VASCULAR/THIN (ENDOMECHANICALS) IMPLANT
RELOAD ENDO STITCH 2.0 (ENDOMECHANICALS) ×9
RELOAD STAPLE 45 2.5 WHT GRN (ENDOMECHANICALS) IMPLANT
RELOAD STAPLE 45 3.5 BLU ETS (ENDOMECHANICALS) IMPLANT
RELOAD STAPLE 60 2.6 WHT THN (STAPLE) ×4 IMPLANT
RELOAD STAPLE 60 3.6 BLU REG (STAPLE) ×4 IMPLANT
RELOAD STAPLE 60 3.8 GOLD REG (STAPLE) ×2 IMPLANT
RELOAD STAPLE TA45 3.5 REG BLU (ENDOMECHANICALS) IMPLANT
RELOAD STAPLER BLUE 60MM (STAPLE) ×4 IMPLANT
RELOAD STAPLER GOLD 60MM (STAPLE) ×1 IMPLANT
RELOAD STAPLER WHITE 60MM (STAPLE) ×2 IMPLANT
RELOAD SUT SNGL STCH ABSRB 2-0 (ENDOMECHANICALS) ×10 IMPLANT
RELOAD SUT SNGL STCH BLK 2-0 (ENDOMECHANICALS) ×8 IMPLANT
SCISSORS LAP 5X45 EPIX DISP (ENDOMECHANICALS) ×2 IMPLANT
SET TUBE SMOKE EVAC HIGH FLOW (TUBING) ×2 IMPLANT
SHEARS HARMONIC ACE PLUS 45CM (MISCELLANEOUS) ×2 IMPLANT
SLEEVE ADV FIXATION 12X100MM (TROCAR) ×4 IMPLANT
SLEEVE ADV FIXATION 5X100MM (TROCAR) IMPLANT
SOLUTION ANTFG W/FOAM PAD STRL (MISCELLANEOUS) ×2 IMPLANT
STAPLER ECHELON BIOABSB 60 FLE (MISCELLANEOUS) IMPLANT
STAPLER ECHELON LONG 60 440 (INSTRUMENTS) ×2 IMPLANT
STAPLER RELOAD BLUE 60MM (STAPLE) ×4
STAPLER RELOAD GOLD 60MM (STAPLE) ×1
STAPLER RELOAD WHITE 60MM (STAPLE) ×2
STAPLER VISISTAT 35W (STAPLE) ×2 IMPLANT
STRIP CLOSURE SKIN 1/2X4 (GAUZE/BANDAGES/DRESSINGS) ×2 IMPLANT
SUCTION POOLE HANDLE (INSTRUMENTS)
SURGILUBE 2OZ TUBE FLIPTOP (MISCELLANEOUS) ×2 IMPLANT
SUT MNCRL AB 4-0 PS2 18 (SUTURE) ×2 IMPLANT
SUT RELOAD ENDO STITCH 2 48X1 (ENDOMECHANICALS) ×5
SUT RELOAD ENDO STITCH 2.0 (ENDOMECHANICALS) ×4
SUT SILK 2 0 (SUTURE)
SUT SILK 2 0 SH CR/8 (SUTURE) IMPLANT
SUT SILK 2-0 18XBRD TIE 12 (SUTURE) IMPLANT
SUT SILK 3 0 (SUTURE)
SUT SILK 3 0 SH CR/8 (SUTURE) IMPLANT
SUT SILK 3-0 18XBRD TIE 12 (SUTURE) IMPLANT
SUT SURGIDAC NAB ES-9 0 48 120 (SUTURE) IMPLANT
SUT VIC AB 2-0 SH 27 (SUTURE) ×1
SUT VIC AB 2-0 SH 27X BRD (SUTURE) ×2 IMPLANT
SUT VICRYL 2 0 18  UND BR (SUTURE)
SUT VICRYL 2 0 18 UND BR (SUTURE) IMPLANT
SUTURE RELOAD END STTCH 2 48X1 (ENDOMECHANICALS) ×5 IMPLANT
SUTURE RELOAD ENDO STITCH 2.0 (ENDOMECHANICALS) ×4 IMPLANT
SYR 20ML LL LF (SYRINGE) ×4 IMPLANT
TOWEL OR 17X26 10 PK STRL BLUE (TOWEL DISPOSABLE) ×4 IMPLANT
TOWEL OR NON WOVEN STRL DISP B (DISPOSABLE) ×2 IMPLANT
TRAY FOLEY MTR SLVR 16FR STAT (SET/KITS/TRAYS/PACK) IMPLANT
TROCAR ADV FIXATION 12X100MM (TROCAR) ×2 IMPLANT
TROCAR ADV FIXATION 5X100MM (TROCAR) ×2 IMPLANT
TROCAR XCEL NON-BLD 5MMX100MML (ENDOMECHANICALS) ×2 IMPLANT
TUBING CONNECTING 10 (TUBING) ×4 IMPLANT
YANKAUER SUCT BULB TIP NO VENT (SUCTIONS) IMPLANT

## 2022-07-28 NOTE — Op Note (Signed)
Angela Johnson [id number] [birth year] 07/28/2022  Preoperative diagnosis: roux en Y gastric bypass in progress  Postoperative diagnosis: Same   Procedure: Upper endoscopy   Surgeon: Susy Frizzle B. Daphine Deutscher  M.D., FACS   Anesthesia: Gen.   Indications for procedure: This patient was undergoing a gastric bypass by Dr. Andrey Campanile.  There was a question about a hiatal hernia. [name]    Description of procedure: The endoscopy was placed in the mouth and into the oropharynx and under endoscopic vision it was advanced to the esophagogastric junction.  The pouch was insufflated and was about 6 cm long and GE junction didn't reveal a significant hiatal hernia.   No bleeding or leaks were detected.  The scope was withdrawn without difficulty.     [name] Daphine Deutscher, MD, FACS General, Bariatric, & Minimally Invasive Surgery [facility], [place]

## 2022-07-28 NOTE — Op Note (Signed)
Angela Johnson [id number] 14-Dec-1985. 07/28/2022  Preoperative diagnosis:  Severe obesity bmi 57  Primary hypertension  OSA (obstructive sleep apnea)  Metabolic syndrome  Generalized anxiety disorder with panic attacks  Lumbar radiculopathy  Hashimoto's thyroiditis  Vitamin D deficiency  Prediabetes  Hypercholesterolemia with hypertriglyceridemia   Postoperative  diagnosis:  1. same  Surgical procedure: Laparoscopic Roux-en-Y gastric bypass (ante-colic, ante-gastric); upper endoscopy  Surgeon: Atilano Ina, M.D. FACS  Asst.: Luretha Murphy MD FACS  Anesthesia: General plus exparel/marcaine mix  Complications: None   EBL: Minimal   Drains: None   Disposition: PACU in good condition   Indications for procedure: 37 y.o. yo female with morbid obesity who has been unsuccessful at sustained weight loss. The patient's comorbidities are listed above. We discussed the risk and benefits of surgery including but not limited to anesthesia risk, bleeding, infection, blood clot formation, anastomotic leak, anastomotic stricture, ulcer formation, death, respiratory complications, intestinal blockage, internal hernia, gallstone formation, vitamin and nutritional deficiencies, injury to surrounding structures, failure to lose weight and mood changes.   Description of procedure: Patient is brought to the operating room and general anesthesia induced. The patient had received preoperative broad-spectrum IV antibiotics and subcutaneous heparin. The abdomen was widely sterilely prepped with Chloraprep and draped. Patient timeout was performed and correct patient and procedure confirmed. Access was obtained with a 5 mm Optiview trocar in the left upper quadrant and pneumoperitoneum established without difficulty. Under direct vision 12 mm trocars were placed laterally in the right upper quadrant, right upper quadrant midclavicular line, and to the left and above the umbilicus for the camera  port. A 5 mm trocar was placed laterally in the left upper quadrant.  Exparel/marcaine mix was infiltrated in bilateral lateral abdominal walls as a TAP block for postop pain relief.  The omentum was brought into the upper abdomen and the transverse mesocolon elevated and the ligament of Treitz clearly identified. A 50 cm biliopancreatic limb was then carefully measured from the ligament of Treitz. The small intestine was divided at this point with a single firing of the white load linear stapler. There was some bleeding from the mesentery division which was managed with clips. A Penrose drain was sutured to the end of the Roux-en-Y limb for later identification. A 100 cm Roux-en-Y limb was then carefully measured. At this point a side-to-side anastomosis was created between the Roux limb and the end of the biliopancreatic limb. This was accomplished with a single firing of the 60 mm white load linear stapler. The common enterotomy was closed with a running 2-0 Vicryl begun at either end of the enterotomy and tied centrally. Vistaseal tissue sealant was placed over the anastomosis. The mesenteric defect was then closed with running 2-0 silk. The omentum was then divided with the harmonic scalpel up towards the transverse colon to allow mobility of the Roux limb toward the gastric pouch. The patient was then placed in steep reversed Trendelenburg. Through a 5 mm subxiphoid site the [facility] retractor was placed and the left lobe of the liver elevated with excellent exposure of the upper stomach and hiatus. The angle of Hiss was then mobilized with the harmonic scalpel. A 5 cm gastric pouch was then carefully measured along the lesser curve of the stomach. Dissection was carried along the lesser curve at this point with the Harmonic scalpel working carefully back toward the lesser sac at right angles to the lesser curve. The free lesser sac was then entered. After being sure all tubes were  removed from the stomach an  initial firing of the gold load 60 mm linear stapler was fired at right angles across the lesser curve for about 4 cm. The gastric pouch was further mobilized posteriorly and then the pouch was completed with 4 further firings of the 60 mm blue load linear stapler up through the previously dissected angle of His. It was ensured that the pouch was completely mobilized away from the gastric remnant. This created a nice tubular 5 cm gastric pouch. The Roux limb was then brought up in an antecolic fashion with the candycane facing to the patient's left without undue tension. The gastrojejunostomy was created with an initial posterior row of 2-0 Vicryl between the Roux limb and the staple line of the gastric pouch. Enterotomies were then made in the gastric pouch and the Roux limb with the ligasure and at approximately 2-2-1/2 cm anastomosis was created with a single firing of the 60mm blue load linear stapler. The staple line was inspected and was intact without bleeding. The common enterotomy was then closed with running 2-0 Vicryl begun at either end and tied centrally. The Ewall tube was then easily passed through the anastomosis and an outer anterior layer of running 2-0 Vicryl was placed. The Ewald tube was removed. With the outlet of the gastrojejunostomy clamped and under saline irrigation the assistant performed upper endoscopy and with the gastric pouch tensely distended with air-there was no evidence of leak on this test. The pouch was desufflated. The Vonita Moss defect was closed with running 2-0 silk. The abdomen was inspected for any evidence of bleeding or bowel injury and everything looked fine. The Nathanson retractor was removed under direct vision after coating the anastomosis with Vistaseal tissue sealant. All CO2 was evacuated and trochars removed. Skin incisions were closed with 4-0 monocryl in a subcuticular fashion followed bysteri-strips and bandages. Sponge needle and instrument counts were  correct. The patient was taken to the PACU in good condition.    [name]. Andrey Campanile, MD, FACS General, Bariatric, & Minimally Invasive Surgery [facility], [place]

## 2022-07-28 NOTE — TOC CM/SW Note (Signed)
  Transition of Care (TOC) Screening Note   Patient Details  Name: Angela Johnson Date of Birth: 26-Jun-1985   Transition of Care [facility]) CM/SW Contact:    Amada Jupiter, LCSW Phone Number: 07/28/2022, 1:27 PM    Transition of Care Department [facility]) has reviewed patient and no TOC needs have been identified at this time. We will continue to monitor patient advancement through interdisciplinary progression rounds. If new patient transition needs arise, please place a TOC consult.

## 2022-07-28 NOTE — Anesthesia Procedure Notes (Signed)
Procedure Name: Intubation Date/Time: 07/28/2022 8:43 AM  Performed by: Theodosia Quay, CRNAPre-anesthesia Checklist: Patient identified, Emergency Drugs available, Suction available, Patient being monitored and Timeout performed Patient Re-evaluated:Patient Re-evaluated prior to induction Oxygen Delivery Method: Circle system utilized Preoxygenation: Pre-oxygenation with 100% oxygen Induction Type: IV induction Ventilation: Mask ventilation without difficulty Laryngoscope Size: Mac and 3 Grade View: Grade I Tube type: Oral Tube size: 7.0 mm Number of attempts: 1 Airway Equipment and Method: Stylet Placement Confirmation: ETT inserted through vocal cords under direct vision, positive ETCO2, CO2 detector and breath sounds checked- equal and bilateral Secured at: 21 cm Tube secured with: Tape Dental Injury: Teeth and Oropharynx as per pre-operative assessment  Comments: ATOI

## 2022-07-28 NOTE — Progress Notes (Signed)
PHARMACY CONSULT FOR:  Risk Assessment for Post-Discharge VTE Following Bariatric Surgery  Post-Discharge VTE Risk Assessment: This patient's probability of 30-day post-discharge VTE is increased due to the factors marked:  Sleeve gastrectomy   Liver disorder (transplant, cirrhosis, or nonalcoholic steatohepatitis)   Hx of VTE   Hemorrhage requiring transfusion   GI perforation, leak, or obstruction   ====================================================    Female    Age >/=60 years  X  BMI >/=50 kg/m2    CHF    Dyspnea at Rest    Paraplegia   X Non-gastric-band surgery    Operation Time >/=3 hr    Return to OR     Length of Stay >/= 3 d   Hypercoagulable condition   Significant venous stasis      Predicted probability of 30-day post-discharge VTE: 0.27%  Other patient-specific factors to consider: none  Recommendation for Discharge: No pharmacologic prophylaxis post-discharge  Angela Johnson is a 37 y.o. female who underwent laparoscopic Roux-en-Y gastric bypass 07/28/2022   Allergies  Allergen Reactions   Ciprofloxacin Hcl Itching   Latex Itching    Power   Zithromax [Azithromycin]     dizziness    Patient Measurements: Height: 5\' 9"  (175.3 cm) Weight: (!) 175.8 kg (387 lb 9.6 oz) IBW/kg (Calculated) : 66.2 Body mass index is 57.24 kg/m.  No results for input(s): "WBC", "HGB", "HCT", "PLT", "APTT", "CREATININE", "LABCREA", "CREAT24HRUR", "MG", "PHOS", "ALBUMIN", "PROT", "AST", "ALT", "ALKPHOS", "BILITOT", "BILIDIR", "IBILI" in the last 72 hours. Estimated Creatinine Clearance: 151.7 mL/min (by C-G formula based on SCr of 0.89 mg/dL).    Past Medical History:  Diagnosis Date   Alcohol abuse    history   Anxiety    Arthritis    Back pain    Depression    Drug abuse (HCC)    history   GERD (gastroesophageal reflux disease)    Gonorrhea 03/31/2005   Treated   Hypothyroidism    IBS (irritable bowel syndrome)    Other hyperlipidemia    Palpitations     Pre-diabetes    SOB (shortness of breath)    due to weight   Vitamin D deficiency      Medications Prior to Admission  Medication Sig Dispense Refill Last Dose   busPIRone (BUSPAR) 15 MG tablet Take 1 tablet (15 mg total) by mouth 2 (two) times daily. Put on file 180 tablet 0 07/27/2022   cetirizine (ZYRTEC) 10 MG tablet Take 10 mg by mouth daily. allergies    07/27/2022   Cholecalciferol (VITAMIN D3 PO) Take 1 tablet by mouth daily.   07/20/2022   citalopram (CELEXA) 40 MG tablet Take 1 tablet (40 mg total) by mouth daily. (NEEDS TO BE SEEN BEFORE NEXT REFILL) 30 tablet 0 07/27/2022   fluticasone (FLONASE) 50 MCG/ACT nasal spray Place 2 sprays into both nostrils daily. (Patient taking differently: Place 2 sprays into both nostrils daily as needed for allergies.) 16 g 6 Past Month   levothyroxine (SYNTHROID) 100 MCG tablet Take 1 tablet (100 mcg total) by mouth daily. 90 tablet 1 07/27/2022 at 2100   norgestrel-ethinyl estradiol (LO/OVRAL,CRYSELLE) 0.3-30 MG-MCG tablet Take 1 tablet by mouth daily.   07/20/2022   buPROPion (WELLBUTRIN XL) 300 MG 24 hr tablet Take 1 tablet (300 mg total) by mouth daily. (Patient not taking: Reported on 07/24/2022) 90 tablet 3 Completed Course   hydrochlorothiazide (HYDRODIURIL) 25 MG tablet Take 1 tablet (25 mg total) by mouth daily. (Patient not taking: Reported on 07/24/2022) 90 tablet 3  Not Taking   hydrOXYzine (ATARAX) 25 MG tablet Take 0.5-1 tablets (12.5-25 mg total) by mouth every 8 (eight) hours as needed for anxiety (panic attack). 30 tablet 0 More than a month   pantoprazole (PROTONIX) 40 MG tablet Take 1 tablet (40 mg total) by mouth daily. (Patient not taking: Reported on 07/24/2022) 30 tablet 3 Not Taking     Herby Abraham, Pharm.D Use secure chat for questions 07/28/2022 11:05 AM

## 2022-07-28 NOTE — Progress Notes (Signed)
Patient ambulated 360 ft in hallway with assistance

## 2022-07-28 NOTE — Interval H&P Note (Signed)
History and Physical Interval Note:  07/28/2022 8:13 AM  Angela Johnson  has presented today for surgery, with the diagnosis of MORBID OBESITY.  The various methods of treatment have been discussed with the patient and family. After consideration of risks, benefits and other options for treatment, the patient has consented to  Procedure(s): LAPAROSCOPIC ROUX-EN-Y GASTRIC BYPASS WITH UPPER ENDOSCOPY (N/A) HERNIA REPAIR HIATAL (N/A) as a surgical intervention.  The patient's history has been reviewed, patient examined, no change in status, stable for surgery.  I have reviewed the patient's chart and labs.  Questions were answered to the patient's satisfaction.     Gaynelle Adu

## 2022-07-28 NOTE — Transfer of Care (Signed)
Immediate Anesthesia Transfer of Care Note  Patient: Angela Johnson  Procedure(s) Performed: LAPAROSCOPIC ROUX-EN-Y GASTRIC BYPASS WITH UPPER ENDOSCOPY HERNIA REPAIR HIATAL  Patient Location: PACU  Anesthesia Type:General  Level of Consciousness: drowsy  Airway & Oxygen Therapy: Patient Spontanous Breathing and Patient connected to face mask oxygen  Post-op Assessment: Report given to RN, Post -op Vital signs reviewed and stable, and Patient moving all extremities X 4  Post vital signs: Reviewed and stable  Last Vitals:  Vitals Value Taken Time  BP 177/93   Temp    Pulse 99 07/28/22 1058  Resp 12   SpO2 100 % 07/28/22 1058  Vitals shown include unvalidated device data.  Last Pain:  Vitals:   07/28/22 0631  TempSrc:   PainSc: 0-No pain         Complications: No notable events documented.

## 2022-07-28 NOTE — H&P (Signed)
PROVIDER: Wilmoth Rasnic Sherril Cong, MD  MRN: [id number] DOB: 11/04/1985 DATE OF ENCOUNTER: 07/16/2022 Subjective  Chief Complaint: follow up visit ( Preop for surgery date 07/28/22)   History of Present Illness: Angela Johnson is a 37 y.o. female who is seen today for follow-up regarding her severe obesity and related comorbidities..  Comorbidities include mild obstructive sleep apnea, dyslipidemia, low back pain with left hip sciatica, Hashimoto's thyroiditis, hypertension  SHe has completed the bariatric surgery evaluation process. She has received nutritional and psychological clearance.  She denies any medical changes since I saw her in February. She has an appointment with her PCP in May to discuss some of her labs that came back in March that we checked. She completed her vitamin D 50,000 units once a week and is taking over-the-counter daily multivitamin along with vitamin D.  She denies any chest pain or chest pressure shortness of breath, abdominal pain, diarrhea or constipation. She denies any dysuria. She has started her liver shrinking diet. She is accompanied by her mother.  Review of Systems: A complete review of systems was obtained from the patient. I have reviewed this information and discussed as appropriate with the patient. See HPI as well for other ROS.  ROS  Medical History: Past Medical History: Diagnosis Date Anxiety Sleep apnea Thyroid disease  Patient Active Problem List Diagnosis Hashimoto's thyroiditis Generalized anxiety disorder with panic attacks Lumbar radiculopathy OSA (obstructive sleep apnea) Primary hypertension Metabolic syndrome  History reviewed. No pertinent surgical history.  Allergies Allergen Reactions Azithromycin Unknown dizziness Ciprofloxacin Itching and Other (See Comments) Latex Other (See Comments)  Current Outpatient Medications on File Prior to Visit Medication Sig Dispense Refill buPROPion (WELLBUTRIN XL) 300 MG XL tablet  Take 1 tablet by mouth once daily busPIRone (BUSPAR) 15 MG tablet Take 1 tablet by mouth 2 (two) times daily cetirizine (ZYRTEC) 10 MG tablet Take by mouth citalopram (CELEXA) 40 MG tablet Take 1 tablet by mouth once daily fluticasone propionate (FLONASE) 50 mcg/actuation nasal spray INSTILL 2 SPRAYS INTO BOTH NOSTRILS DAILY. hydrOXYzine (ATARAX) 25 MG tablet TAKE ONE-HALF TO 1 TABLET EVERY 8 HOURS AS NEEDED FOR ANXIETY norgestrel-ethinyl estradioL (CRYSELLE, 28,) 0.3-30 mg-mcg tablet Take 1 tablet by mouth once daily SYNTHROID 100 mcg tablet Take 1 tablet by mouth once daily ergocalciferol, vitamin D2, 1,250 mcg (50,000 unit) capsule Take 1 capsule (50,000 Units total) by mouth once a week for 30 days 4 capsule 0 hydroCHLOROthiazide (HYDRODIURIL) 25 MG tablet Take 1 tablet by mouth once daily (Patient not taking: Reported on 07/16/2022)  No current facility-administered medications on file prior to visit.  History reviewed. No pertinent family history.  Social History  Tobacco Use Smoking Status Never Smokeless Tobacco Never   Social History  Socioeconomic History Marital status: Married Tobacco Use Smoking status: Never Smokeless tobacco: Never Vaping Use Vaping status: Never Used Substance and Sexual Activity Alcohol use: Never Drug use: Never  Social Determinants of Health  Received from [employer]  Objective:  Vitals: 07/16/22 0946 BP: 130/78 Pulse: 91 Temp: 36.8 C (98.2 F) SpO2: 97% Weight: (!) 182.3 kg (401 lb 12.8 oz) Height: 177.8 cm (5\' 10" ) PainSc: 0-No pain  Body mass index is 57.65 kg/m.  Gen: alert, NAD, non-toxic appearing severe obesity Pupils: equal, no scleral icterus Pulm: Lungs clear to auscultation, symmetric chest rise CV: regular rate and rhythm Abd: soft, nontender, nondistended. No cellulitis. No incisional hernia Ext: no edema, Skin: no rash, no jaundice  Labs, Imaging and Diagnostic Testing:  Upper GI  June 11, 2022 showed normal anatomy of the esophagus, stomach and duodenum; tiny hiatal hernia, mild GERD  Chest x-ray June 11, 2022 within normal limits  Home sleep test August 2023 showed mild obstructive sleep apnea with a total AHI of 12.4/h and O2 nadir of 86%  Labs from March 4 showed a normal comprehensive metabolic panel except for mildly elevated blood glucose level, vitamin D deficiency of 11.9; hemoglobin A1c level of 5.8; TSH 6.47  Lipid panel showed cholesterol of 232, triglycerides 299, HDL 37, LDL 141  Assessment and Plan: Diagnoses and all orders for this visit:  Severe obesity (CMS/HHS-HCC)  Primary hypertension  OSA (obstructive sleep apnea)  Metabolic syndrome  Generalized anxiety disorder with panic attacks  Lumbar radiculopathy  Hashimoto's thyroiditis  Vitamin D deficiency  Prediabetes  Hypercholesterolemia with hypertriglyceridemia    We reviewed her evaluation including her imaging, and labs. We discussed the findings of her high cholesterol, prediabetes, vitamin D deficiency. She has chronic thyroid issues and has an appointment to discuss her thyroid medication with her PCP. She has her preoperative education class next week but has started her preoperative meal plan. We discussed the importance of the preoperative meal plan to help shrink the liver in order to see the stomach and perform the surgery. We rediscussed the typical hospitalization. We discussed the typical recovery. We discussed the typical quirks that we see after surgery. I offered to rediscussed the steps of the surgery along with risk and benefits but she declined. She read over the surgical consent form and signed.  This patient encounter took 30 minutes today to perform the following: take history, perform exam, review outside records, interpret imaging, counsel the patient on their diagnosis and document encounter, findings & plan in the EHR  No follow-ups on file.  [name]. Andrey Campanile MD  FACS General, Minimally Invasive, & Bariatric Surgery Electronically signed by Gara Kroner, MD at 07/16/2022 10:12 AM EDT

## 2022-07-28 NOTE — Anesthesia Postprocedure Evaluation (Signed)
Anesthesia Post Note  Patient: Angela Johnson  Procedure(s) Performed: LAPAROSCOPIC ROUX-EN-Y GASTRIC BYPASS WITH UPPER ENDOSCOPY     Patient location during evaluation: PACU Anesthesia Type: General Level of consciousness: awake and alert Pain management: pain level controlled Vital Signs Assessment: post-procedure vital signs reviewed and stable Respiratory status: spontaneous breathing, nonlabored ventilation, respiratory function stable and patient connected to nasal cannula oxygen Cardiovascular status: blood pressure returned to baseline and stable Postop Assessment: no apparent nausea or vomiting Anesthetic complications: no  No notable events documented.  Last Vitals:  Vitals:   07/28/22 1429 07/28/22 1521  BP: (!) 146/87 138/80  Pulse: 86 79  Resp: 18 18  Temp: 36.4 C 36.7 C  SpO2: 95% 99%    Last Pain:  Vitals:   07/28/22 1521  TempSrc: Oral  PainSc:                  Kennieth Rad

## 2022-07-28 NOTE — Progress Notes (Signed)
Discussed QI "Goals for Discharge" document with patient including ambulation in halls, Incentive Spirometry use every hour, and oral care.  Also discussed pain and nausea control.  Enabled or verified head of bed 30 degree alarm activated.  BSTOP education provided including BSTOP information guide, "Guide for Pain Management after your Bariatric Procedure".  Diet progression education provided including "Bariatric Surgery Post-Op Food Plan Phase 1: Liquids".  Questions answered.  Will continue to partner with bedside RN and follow up with patient per protocol.    Thank you,  Arena Lindahl Micaiah Litle, RN, MSN Bariatric Nurse Coordinator [phone] (office)  

## 2022-07-29 ENCOUNTER — Other Ambulatory Visit (HOSPITAL_COMMUNITY): Payer: Self-pay

## 2022-07-29 ENCOUNTER — Encounter (HOSPITAL_COMMUNITY): Payer: Self-pay | Admitting: General Surgery

## 2022-07-29 LAB — CBC WITH DIFFERENTIAL/PLATELET
Abs Immature Granulocytes: 0.08 10*3/uL — ABNORMAL HIGH (ref 0.00–0.07)
Basophils Absolute: 0 10*3/uL (ref 0.0–0.1)
Basophils Relative: 0 %
Eosinophils Absolute: 0 10*3/uL (ref 0.0–0.5)
Eosinophils Relative: 0 %
HCT: 37.2 % (ref 36.0–46.0)
Hemoglobin: 12.1 g/dL (ref 12.0–15.0)
Immature Granulocytes: 1 %
Lymphocytes Relative: 12 %
Lymphs Abs: 1.4 10*3/uL (ref 0.7–4.0)
MCH: 29.7 pg (ref 26.0–34.0)
MCHC: 32.5 g/dL (ref 30.0–36.0)
MCV: 91.4 fL (ref 80.0–100.0)
Monocytes Absolute: 0.7 10*3/uL (ref 0.1–1.0)
Monocytes Relative: 6 %
Neutro Abs: 9.5 10*3/uL — ABNORMAL HIGH (ref 1.7–7.7)
Neutrophils Relative %: 81 %
Platelets: 217 10*3/uL (ref 150–400)
RBC: 4.07 MIL/uL (ref 3.87–5.11)
RDW: 13.3 % (ref 11.5–15.5)
WBC: 11.7 10*3/uL — ABNORMAL HIGH (ref 4.0–10.5)
nRBC: 0 % (ref 0.0–0.2)

## 2022-07-29 LAB — COMPREHENSIVE METABOLIC PANEL
ALT: 68 U/L — ABNORMAL HIGH (ref 0–44)
AST: 54 U/L — ABNORMAL HIGH (ref 15–41)
Albumin: 3.9 g/dL (ref 3.5–5.0)
Alkaline Phosphatase: 62 U/L (ref 38–126)
Anion gap: 9 (ref 5–15)
BUN: 8 mg/dL (ref 6–20)
CO2: 24 mmol/L (ref 22–32)
Calcium: 8.8 mg/dL — ABNORMAL LOW (ref 8.9–10.3)
Chloride: 103 mmol/L (ref 98–111)
Creatinine, Ser: 0.85 mg/dL (ref 0.44–1.00)
GFR, Estimated: >60 mL/min (ref >60)
Glucose, Bld: 141 mg/dL — ABNORMAL HIGH (ref 70–99)
Potassium: 4 mmol/L (ref 3.5–5.1)
Sodium: 136 mmol/L (ref 135–145)
Total Bilirubin: 0.7 mg/dL (ref 0.3–1.2)
Total Protein: 7.4 g/dL (ref 6.5–8.1)

## 2022-07-29 LAB — GLUCOSE, CAPILLARY
Glucose-Capillary: 150 mg/dL — ABNORMAL HIGH (ref 70–99)
Glucose-Capillary: 145 mg/dL — ABNORMAL HIGH (ref 70–99)
Glucose-Capillary: 113 mg/dL — ABNORMAL HIGH (ref 70–99)

## 2022-07-29 MED ORDER — PANTOPRAZOLE SODIUM 40 MG PO TBEC
40.0000 mg | DELAYED_RELEASE_TABLET | Freq: Every day | ORAL | 3 refills | Status: DC
Start: 2022-07-29 — End: 2022-11-28
  Filled 2022-07-29: qty 30, 30d supply, fill #0

## 2022-07-29 MED ORDER — TRAMADOL HCL 50 MG PO TABS
50.0000 mg | ORAL_TABLET | Freq: Four times a day (QID) | ORAL | 0 refills | Status: DC | PRN
  Filled 2022-07-29: qty 10, 3d supply, fill #0

## 2022-07-29 MED ORDER — ONDANSETRON 4 MG PO TBDP
4.0000 mg | ORAL_TABLET | Freq: Four times a day (QID) | ORAL | 0 refills | Status: AC | PRN
Start: 2022-07-29 — End: ?
  Filled 2022-07-29: qty 18, 5d supply, fill #0

## 2022-07-29 MED ORDER — ACETAMINOPHEN 500 MG PO TABS
1000.0000 mg | ORAL_TABLET | Freq: Three times a day (TID) | ORAL | 0 refills | Status: AC
Start: 2022-07-29 — End: 2022-08-03

## 2022-07-29 NOTE — Progress Notes (Signed)
Patient alert and oriented, pain is controlled. Patient is tolerating fluids, advanced to protein shake today, patient is tolerating well. Reviewed Gastric sleeve discharge instructions with patient and patient is able to articulate understanding. Provided information on BELT program, Support Group and [facility] outpatient pharmacy. Communicated general update of patient status to surgeon. All questions answered. 24hr fluid recall is 440 mL per hydration protocol, bariatric nurse coordinator to make follow-up phone call within one week.

## 2022-07-29 NOTE — Discharge Summary (Signed)
Physician Discharge Summary  Angela Johnson BJY:[id number] DOB: [birth year] DOA: 07/28/2022  PCP: Raliegh Ip, DO  Admit date: 07/28/2022 Discharge date: 07/29/2022  Recommendations for Outpatient Follow-up:     Follow-up Information     Gaynelle Adu, MD Follow up.   Specialty: General Surgery Contact information: [address] [place] [address] [phone]         Gaynelle Adu, MD Follow up.   Specialty: General Surgery Contact information: [address] [place] [address] [place] [address] [phone]                Discharge Diagnoses:  Principal Problem:   S/P gastric bypass Severe obesity bmi 57  Primary hypertension  OSA (obstructive sleep apnea)  Metabolic syndrome  Generalized anxiety disorder with panic attacks  Lumbar radiculopathy  Hashimoto's thyroiditis  Vitamin D deficiency  Prediabetes  Hypercholesterolemia with hypertriglyceridemia   Surgical Procedure: Laparoscopic Roux-en-Y gastric bypass, upper endoscopy  Discharge Condition: Good Disposition: Home  Diet recommendation: Postoperative gastric bypass diet  Filed Weights   07/28/22 0629 07/28/22 0631  Weight: (!) 175.8 kg (!) 175.8 kg     Hospital Course:  The patient was admitted for a planned laparoscopic Roux-en-Y gastric bypass. Please see operative note. Preoperatively the patient was given 5000 units of subcutaneous heparin for DVT prophylaxis. ERAS protocol was used. Postoperative prophylactic heparin dosing was started on the evening of postoperative day 0.  The patient was started on ice chips and water on the evening of POD 0 which they tolerated. On postoperative day 1 The patient's diet was advanced to protein shakes which they also tolerated. On POD 1, The patient was ambulating without difficulty. Their vital signs are stable without fever or tachycardia. Their hemoglobin had remained stable. The patient had received discharge  instructions and counseling. They were deemed stable for discharge.  BP 102/67 (BP Location: Left Arm)   Pulse 72   Temp (!) 97.4 F (36.3 C) (Oral)   Resp 18   Ht 5\' 9"  (1.753 m)   Wt (!) 175.8 kg   LMP 07/09/2022   SpO2 98%   BMI 57.24 kg/m   Gen: alert, NAD, non-toxic appearing Pupils: equal, no scleral icterus Pulm: Lungs clear to auscultation, symmetric chest rise CV: regular rate and rhythm Abd: soft, min tender, nondistended. No cellulitis. No incisional hernia;some bruising Ext: no edema, no calf tenderness Skin: no rash, no jaundice  Discharge Instructions  Discharge Instructions     Ambulate hourly while awake   Complete by: As directed    Call MD for:  difficulty breathing, headache or visual disturbances   Complete by: As directed    Call MD for:  persistant dizziness or light-headedness   Complete by: As directed    Call MD for:  persistant nausea and vomiting   Complete by: As directed    Call MD for:  redness, tenderness, or signs of infection (pain, swelling, redness, odor or green/yellow discharge around incision site)   Complete by: As directed    Call MD for:  severe uncontrolled pain   Complete by: As directed    Call MD for:  temperature >101 F   Complete by: As directed    Diet bariatric full liquid   Complete by: As directed    Discharge instructions   Complete by: As directed    See bariatric discharge instructions   Incentive spirometry   Complete by: As directed    Perform hourly while awake  Allergies as of 07/29/2022       Reactions   Ciprofloxacin Hcl Itching   Latex Itching   Power   Zithromax [azithromycin]    dizziness        Medication List     STOP taking these medications    buPROPion 300 MG 24 hr tablet Commonly known as: WELLBUTRIN XL   hydrochlorothiazide 25 MG tablet Commonly known as: HYDRODIURIL       TAKE these medications    acetaminophen 500 MG tablet Commonly known as: TYLENOL Take 2  tablets (1,000 mg total) by mouth every 8 (eight) hours for 5 days.   busPIRone 15 MG tablet Commonly known as: BUSPAR Take 1 tablet (15 mg total) by mouth 2 (two) times daily. Put on file   cetirizine 10 MG tablet Commonly known as: ZYRTEC Take 10 mg by mouth daily. allergies   citalopram 40 MG tablet Commonly known as: CELEXA Take 1 tablet (40 mg total) by mouth daily. (NEEDS TO BE SEEN BEFORE NEXT REFILL)   fluticasone 50 MCG/ACT nasal spray Commonly known as: FLONASE Place 2 sprays into both nostrils daily. What changed:  when to take this reasons to take this   hydrOXYzine 25 MG tablet Commonly known as: ATARAX Take 0.5-1 tablets (12.5-25 mg total) by mouth every 8 (eight) hours as needed for anxiety (panic attack).   levothyroxine 100 MCG tablet Commonly known as: SYNTHROID Take 1 tablet (100 mcg total) by mouth daily.   norgestrel-ethinyl estradiol 0.3-30 MG-MCG tablet Commonly known as: LO/OVRAL Take 1 tablet by mouth daily.   ondansetron 4 MG disintegrating tablet Commonly known as: ZOFRAN-ODT Take 1 tablet (4 mg total) by mouth every 6 (six) hours as needed for nausea or vomiting.   pantoprazole 40 MG tablet Commonly known as: PROTONIX Take 1 tablet (40 mg total) by mouth daily.   traMADol 50 MG tablet Commonly known as: ULTRAM Take 1 tablet (50 mg total) by mouth every 6 (six) hours as needed (pain).   VITAMIN D3 PO Take 1 tablet by mouth daily.        Follow-up Information     Gaynelle Adu, MD Follow up.   Specialty: General Surgery Contact information: [address] [place] [address] [phone]         Gaynelle Adu, MD Follow up.   Specialty: General Surgery Contact information: [address] [place] [address] [phone]                  The results of significant diagnostics from this hospitalization (including imaging, microbiology, ancillary and laboratory) are listed below for  reference.    Significant Diagnostic Studies: No results found.  Labs: Basic Metabolic Panel: Recent Labs  Lab 07/24/22 0833 07/28/22 1243 07/29/22 0514  NA 139  --  136  K 3.8  --  4.0  CL 103  --  103  CO2 24  --  24  GLUCOSE 118*  --  141*  BUN 17  --  8  CREATININE 0.89 0.85 0.85  CALCIUM 9.4  --  8.8*   Liver Function Tests: Recent Labs  Lab 07/29/22 0514  AST 54*  ALT 68*  ALKPHOS 62  BILITOT 0.7  PROT 7.4  ALBUMIN 3.9    CBC: Recent Labs  Lab 07/24/22 0833 07/28/22 1243 07/28/22 2122 07/29/22 0514  WBC 7.5 10.7*  --  11.7*  NEUTROABS  --   --   --  9.5*  HGB  13.7 12.9 12.4 12.1  HCT 42.1 39.4 36.9 37.2  MCV 90.5 90.2  --  91.4  PLT 213 196  --  217    CBG: Recent Labs  Lab 07/28/22 2025 07/28/22 2355 07/29/22 0420 07/29/22 0724 07/29/22 1120  GLUCAP 164* 155* 150* 145* 113*    Principal Problem:   S/P gastric bypass   Time coordinating discharge: 20 min  Signed:  Atilano Ina, MD [facility] [phone] 07/29/2022, 11:51 AM

## 2022-07-29 NOTE — Discharge Instructions (Signed)

## 2022-07-29 NOTE — Progress Notes (Signed)
Discharge instructions given to patient and all questions were answered.  

## 2022-07-30 ENCOUNTER — Telehealth: Payer: Self-pay | Admitting: *Deleted

## 2022-07-30 NOTE — Transitions of Care (Post Inpatient/ED Visit) (Signed)
07/30/2022  Name: Angela Johnson MRN: [id number] DOB: July 19, 1985  Today's TOC FU Call Status: Today's TOC FU Call Status:: Successful TOC FU Call Competed TOC FU Call Complete Date: 07/30/22  Transition Care Management Follow-up Telephone Call Date of Discharge: 07/29/22 Discharge Facility: [facility]) Type of Discharge: Inpatient Admission Primary Inpatient Discharge Diagnosis:: gastric bypass How have you been since you were released from the hospital?: Better Any questions or concerns?: No  Items Reviewed: Did you receive and understand the discharge instructions provided?: Yes Medications obtained,verified, and reconciled?: Yes (Medications Reviewed) Any new allergies since your discharge?: No Dietary orders reviewed?: Yes Type of Diet Ordered:: Diet bariatric full liquid Do you have support at home?: Yes People in Home: spouse Name of Support/Comfort Primary Source: [place]  Medications Reviewed Today: Medications Reviewed Today     Reviewed by Luella Cook, RN (Case Manager) on 07/30/22 at 1549  Med List Status: <None>   Medication Order Taking? Sig Documenting Provider Last Dose Status Informant  acetaminophen (TYLENOL) 500 MG tablet [id number] Yes Take 2 tablets (1,000 mg total) by mouth every 8 (eight) hours for 5 days. Gaynelle Adu, MD Taking Active   busPIRone (BUSPAR) 15 MG tablet [id number] No Take 1 tablet (15 mg total) by mouth 2 (two) times daily. Put on file  Patient not taking: Reported on 07/30/2022   Daryll Drown, NP Not Taking Active Self  cetirizine (ZYRTEC) 10 MG tablet [id number] Yes Take 10 mg by mouth daily. allergies  [provider] Taking Active Self  Cholecalciferol (VITAMIN D3 PO) [id number] Yes Take 1 tablet by mouth daily. [provider] Taking Active Self           Med Note Wilkie Aye, JEANNETTA   Thu Jul 24, 2022 11:06 AM) On hold for procedure  citalopram (CELEXA) 40 MG tablet [id number]  Take 1 tablet (40 mg total)  by mouth daily. (NEEDS TO BE SEEN BEFORE NEXT REFILL) Delynn Flavin M, DO  Active Self  fluticasone (FLONASE) 50 MCG/ACT nasal spray [id number] Yes Place 2 sprays into both nostrils daily.  Patient taking differently: Place 2 sprays into both nostrils daily as needed for allergies.   Ernestina Penna, MD Taking Active Self  hydrOXYzine (ATARAX) 25 MG tablet [id number] Yes Take 0.5-1 tablets (12.5-25 mg total) by mouth every 8 (eight) hours as needed for anxiety (panic attack). Delynn Flavin M, DO Taking Active Self  levothyroxine (SYNTHROID) 100 MCG tablet [id number] Yes Take 1 tablet (100 mcg total) by mouth daily. Delynn Flavin M, DO Taking Active Self  norgestrel-ethinyl estradiol (LO/OVRAL,CRYSELLE) 0.3-30 MG-MCG tablet [id number] Yes Take 1 tablet by mouth daily. [provider] Taking Active Self           Med Note Wilkie Aye, JEANNETTA   Thu Jul 24, 2022 11:03 AM) On hold for procedure  ondansetron (ZOFRAN-ODT) 4 MG disintegrating tablet [id number] Yes Take 1 tablet (4 mg total) by mouth every 6 (six) hours as needed for nausea or vomiting. Gaynelle Adu, MD Taking Active   pantoprazole (PROTONIX) 40 MG tablet [id number] Yes Take 1 tablet (40 mg total) by mouth daily. Gaynelle Adu, MD Taking Active   traMADol Janean Sark) 50 MG tablet [id number]  Take 1 tablet (50 mg total) by mouth every 6 (six) hours as needed (pain). Gaynelle Adu, MD  Active             Home Care and Equipment/Supplies: Were Home Health Services Ordered?: NA Any new equipment or medical supplies ordered?: NA  Functional Questionnaire: Do you need assistance with bathing/showering or dressing?: No Do you need assistance with meal preparation?: No Do you need assistance with eating?: No Do you have difficulty maintaining continence: No Do you need assistance with getting out of bed/getting out of a chair/moving?: No Do you have difficulty managing or taking your medications?: No  Follow up appointments  reviewed: PCP Follow-up appointment confirmed?: NA Specialist Hospital Follow-up appointment confirmed?: Yes Date of Specialist follow-up appointment?: 08/14/22 Follow-Up Specialty Provider:: [id number] [place], [id number] Bariatric classes Do you need transportation to your follow-up appointment?: No Do you understand care options if your condition(s) worsen?: Yes-patient verbalized understanding  SDOH Interventions Today    Flowsheet Row Most Recent Value  SDOH Interventions   Food Insecurity Interventions Intervention Not Indicated  Housing Interventions Intervention Not Indicated  Transportation Interventions Intervention Not Indicated        TOC Interventions Today    Flowsheet Row Most Recent Value  TOC Interventions   TOC Interventions Discussed/Reviewed TOC Interventions Discussed, TOC Interventions Reviewed      Interventions Today    Flowsheet Row Most Recent Value  General Interventions   General Interventions Discussed/Reviewed General Interventions Discussed, General Interventions Reviewed, Doctor Visits  Doctor Visits Discussed/Reviewed Doctor Visits Discussed, Doctor Visits Reviewed, Specialist  PCP/Specialist Visits Compliance with follow-up visit  Exercise Interventions   Exercise Discussed/Reviewed Exercise Discussed, Exercise Reviewed  [discussed frequent walking to decrease risk of blood clots]  Education Interventions   Education Provided Provided Education  [discussed using incentive spirometry]  Provided Verbal Education On Nutrition  Nutrition Interventions   Nutrition Discussed/Reviewed Nutrition Discussed, Nutrition Reviewed, Fluid intake        [facility] [phone]

## 2022-08-07 ENCOUNTER — Telehealth (HOSPITAL_COMMUNITY): Payer: Self-pay | Admitting: *Deleted

## 2022-08-07 NOTE — Telephone Encounter (Signed)
Post-op follow-up call attempt, will attempt again

## 2022-08-11 ENCOUNTER — Telehealth (HOSPITAL_COMMUNITY): Payer: Self-pay | Admitting: *Deleted

## 2022-08-11 NOTE — Telephone Encounter (Signed)
Attempted again to follow-up with pt. Voicemail left.

## 2022-08-12 ENCOUNTER — Encounter: Payer: Self-pay | Admitting: Dietician

## 2022-08-12 ENCOUNTER — Encounter: Payer: BC Managed Care – PPO | Attending: General Surgery | Admitting: Dietician

## 2022-08-12 VITALS — Ht 69.0 in | Wt 368.8 lb

## 2022-08-12 DIAGNOSIS — E669 Obesity, unspecified: Secondary | ICD-10-CM

## 2022-08-12 NOTE — Progress Notes (Signed)
2 Week Post-Operative Nutrition Class   Patient was seen on 08/12/2022 for Post-Operative Nutrition education at the [facility].    Surgery date: 07/28/2022 Surgery type: RYGB  Anthropometrics  Start weight at [facility]: 407.0 lbs (date: 06/02/2022)  Height: 69 in Weight today: 368.8 lbs. BMI: 54.46 kg/m2     Clinical   Pharmacotherapy: History of weight loss medication used: n/a Medical hx: hypercholesterolemia, obesity, hyperthyroidism, HTN, GERD Medications: levothyroxine, lo ovral, Celexa, bupropion, buspirone, allergy med (Claritin), hydroxyzine Labs: no recent labs in EMR; pt states she had labs done today at [facility]. Notable signs/symptoms: none noted Any previous deficiencies? No Bowel Habits: Every day to every other day no complaints   Body Composition Scale 08/12/2022  Current Body Weight 368.8  Total Body Fat % 50.4  Visceral Fat 19  Fat-Free Mass % 49.5   Total Body Water % 39.2  Muscle-Mass lbs 37.7  BMI 54.3  Body Fat Displacement          Torso  lbs 115.5         Left Leg  lbs 23.1         Right Leg  lbs 23.1         Left Arm  lbs 11.5         Right Arm  lbs 11.5      The following the learning objectives were met by the patient during this course: Identifies Soft Prepped Plan Advancement Guide  Identifies Soft, High Proteins (Phase 1), beginning 2 weeks post-operatively to 3 weeks post-operatively Identifies Additional Soft High Proteins, soft non-starchy vegetables, fruits and starches (Phase 2), beginning 3 weeks post-operatively to 3 months post-operatively Identifies appropriate sources of fluids, proteins, vegetables, fruits and starches Identifies appropriate fat sources and healthy verses unhealthy fat types   States protein, vegetable, fruit and starch recommendations and appropriate sources post-operatively Identifies the need for appropriate texture modifications, mastication, and bite sizes when consuming  solids Identifies appropriate fat consumption and sources Identifies appropriate multivitamin and calcium sources post-operatively Describes the need for physical activity post-operatively and will follow MD recommendations States when to call healthcare provider regarding medication questions or post-operative complications   Handouts given during class include: Soft Prepped Plan Advancement Guide   Follow-Up Plan: Patient will follow-up at [facility] in 10 weeks for 3 month post-op nutrition visit for diet advancement per MD.

## 2022-08-21 ENCOUNTER — Telehealth: Payer: Self-pay | Admitting: Dietician

## 2022-08-21 NOTE — Telephone Encounter (Signed)
RD called pt to verify fluid intake once starting soft, solid proteins 2 week post-bariatric surgery.   Daily Fluid intake: Daily Protein intake: Bowel Habits:   Concerns/issues:    Left Voice Message 

## 2022-08-23 ENCOUNTER — Telehealth: Payer: BC Managed Care – PPO | Admitting: Family

## 2022-08-23 DIAGNOSIS — J029 Acute pharyngitis, unspecified: Secondary | ICD-10-CM

## 2022-08-23 DIAGNOSIS — Z20818 Contact with and (suspected) exposure to other bacterial communicable diseases: Secondary | ICD-10-CM | POA: Diagnosis not present

## 2022-08-23 MED ORDER — AMOXICILLIN 500 MG PO CAPS: 500.0000 mg | ORAL_CAPSULE | Freq: Two times a day (BID) | ORAL | 0 refills | Status: AC

## 2022-08-23 NOTE — Progress Notes (Signed)

## 2022-09-04 ENCOUNTER — Other Ambulatory Visit: Payer: Self-pay | Admitting: *Deleted

## 2022-09-04 ENCOUNTER — Encounter: Payer: Self-pay | Admitting: Family Medicine

## 2022-09-04 DIAGNOSIS — F32A Depression, unspecified: Secondary | ICD-10-CM

## 2022-09-04 DIAGNOSIS — F411 Generalized anxiety disorder: Secondary | ICD-10-CM

## 2022-09-04 NOTE — Telephone Encounter (Signed)
[place] NTBS no RF sent other meds were given 30 days in April

## 2022-09-05 ENCOUNTER — Other Ambulatory Visit: Payer: Self-pay | Admitting: Family Medicine

## 2022-09-05 DIAGNOSIS — F411 Generalized anxiety disorder: Secondary | ICD-10-CM

## 2022-09-05 DIAGNOSIS — F32A Depression, unspecified: Secondary | ICD-10-CM

## 2022-09-14 ENCOUNTER — Other Ambulatory Visit: Payer: Self-pay | Admitting: Family Medicine

## 2022-09-14 DIAGNOSIS — F411 Generalized anxiety disorder: Secondary | ICD-10-CM

## 2022-09-15 MED ORDER — CITALOPRAM HYDROBROMIDE 40 MG PO TABS
40.0000 mg | ORAL_TABLET | Freq: Every day | ORAL | 0 refills | Status: DC
Start: 2022-09-15 — End: 2022-11-28

## 2022-09-15 NOTE — Telephone Encounter (Signed)
Appt scheduled for 11/28/22, please send refill for enough meds until then

## 2022-09-15 NOTE — Addendum Note (Signed)
Addended by: Julious Payer D on: 09/15/2022 04:17 PM   Modules accepted: Orders

## 2022-09-15 NOTE — Telephone Encounter (Signed)
[place] NTBS 30 days given 08/23/22

## 2022-10-27 ENCOUNTER — Ambulatory Visit: Payer: BC Managed Care – PPO | Admitting: Dietician

## 2022-11-06 ENCOUNTER — Encounter: Payer: Self-pay | Attending: General Surgery | Admitting: Dietician

## 2022-11-06 DIAGNOSIS — E669 Obesity, unspecified: Secondary | ICD-10-CM | POA: Insufficient documentation

## 2022-11-12 ENCOUNTER — Ambulatory Visit: Payer: BC Managed Care – PPO | Admitting: Family Medicine

## 2022-11-24 ENCOUNTER — Encounter: Payer: Self-pay | Admitting: Family Medicine

## 2022-11-24 DIAGNOSIS — R7303 Prediabetes: Secondary | ICD-10-CM

## 2022-11-24 DIAGNOSIS — E063 Autoimmune thyroiditis: Secondary | ICD-10-CM

## 2022-11-24 DIAGNOSIS — E781 Pure hyperglyceridemia: Secondary | ICD-10-CM

## 2022-11-24 DIAGNOSIS — I10 Essential (primary) hypertension: Secondary | ICD-10-CM

## 2022-11-24 DIAGNOSIS — E538 Deficiency of other specified B group vitamins: Secondary | ICD-10-CM

## 2022-11-26 ENCOUNTER — Other Ambulatory Visit: Payer: BC Managed Care – PPO

## 2022-11-26 DIAGNOSIS — E063 Autoimmune thyroiditis: Secondary | ICD-10-CM

## 2022-11-26 DIAGNOSIS — R7303 Prediabetes: Secondary | ICD-10-CM

## 2022-11-26 DIAGNOSIS — E781 Pure hyperglyceridemia: Secondary | ICD-10-CM

## 2022-11-26 DIAGNOSIS — E538 Deficiency of other specified B group vitamins: Secondary | ICD-10-CM | POA: Diagnosis not present

## 2022-11-26 DIAGNOSIS — I10 Essential (primary) hypertension: Secondary | ICD-10-CM | POA: Diagnosis not present

## 2022-11-26 LAB — BAYER DCA HB A1C WAIVED: HB A1C (BAYER DCA - WAIVED): 5 % (ref 4.8–5.6)

## 2022-11-27 LAB — LIPID PANEL
Chol/HDL Ratio: 6.2 ratio — ABNORMAL HIGH (ref 0.0–4.4)
Cholesterol, Total: 198 mg/dL (ref 100–199)
HDL: 32 mg/dL — ABNORMAL LOW (ref >39)
LDL Chol Calc (NIH): 127 mg/dL — ABNORMAL HIGH (ref 0–99)
Triglycerides: 221 mg/dL — ABNORMAL HIGH (ref 0–149)
VLDL Cholesterol Cal: 39 mg/dL (ref 5–40)

## 2022-11-27 LAB — CMP14+EGFR
ALT: 21 IU/L (ref 0–32)
AST: 16 IU/L (ref 0–40)
Albumin: 4.2 g/dL (ref 3.9–4.9)
Alkaline Phosphatase: 78 IU/L (ref 44–121)
BUN/Creatinine Ratio: 13 (ref 9–23)
BUN: 10 mg/dL (ref 6–20)
Bilirubin Total: 0.4 mg/dL (ref 0.0–1.2)
CO2: 24 mmol/L (ref 20–29)
Calcium: 9.3 mg/dL (ref 8.7–10.2)
Chloride: 105 mmol/L (ref 96–106)
Creatinine, Ser: 0.78 mg/dL (ref 0.57–1.00)
Globulin, Total: 2.5 g/dL (ref 1.5–4.5)
Glucose: 90 mg/dL (ref 70–99)
Potassium: 4.6 mmol/L (ref 3.5–5.2)
Sodium: 141 mmol/L (ref 134–144)
Total Protein: 6.7 g/dL (ref 6.0–8.5)
eGFR: 100 mL/min/{1.73_m2} (ref >59)

## 2022-11-27 LAB — CBC
Hematocrit: 37.5 % (ref 34.0–46.6)
Hemoglobin: 12.5 g/dL (ref 11.1–15.9)
MCH: 30 pg (ref 26.6–33.0)
MCHC: 33.3 g/dL (ref 31.5–35.7)
MCV: 90 fL (ref 79–97)
Platelets: 226 10*3/uL (ref 150–450)
RBC: 4.16 x10E6/uL (ref 3.77–5.28)
RDW: 13.2 % (ref 11.7–15.4)
WBC: 6 10*3/uL (ref 3.4–10.8)

## 2022-11-27 LAB — VITAMIN B12: Vitamin B-12: 183 pg/mL — ABNORMAL LOW (ref 232–1245)

## 2022-11-27 LAB — VITAMIN D 25 HYDROXY (VIT D DEFICIENCY, FRACTURES): Vit D, 25-Hydroxy: 29.1 ng/mL — ABNORMAL LOW (ref 30.0–100.0)

## 2022-11-27 LAB — T4, FREE: Free T4: 0.85 ng/dL (ref 0.82–1.77)

## 2022-11-27 LAB — TSH: TSH: 2.73 u[IU]/mL (ref 0.450–4.500)

## 2022-11-28 ENCOUNTER — Encounter: Payer: Self-pay | Admitting: Family Medicine

## 2022-11-28 ENCOUNTER — Ambulatory Visit (INDEPENDENT_AMBULATORY_CARE_PROVIDER_SITE_OTHER): Payer: BC Managed Care – PPO | Admitting: Family Medicine

## 2022-11-28 VITALS — BP 145/88 | HR 78 | Temp 98.2°F | Resp 20 | Ht 69.0 in | Wt 324.0 lb

## 2022-11-28 DIAGNOSIS — E781 Pure hyperglyceridemia: Secondary | ICD-10-CM

## 2022-11-28 DIAGNOSIS — F411 Generalized anxiety disorder: Secondary | ICD-10-CM

## 2022-11-28 DIAGNOSIS — E538 Deficiency of other specified B group vitamins: Secondary | ICD-10-CM | POA: Diagnosis not present

## 2022-11-28 DIAGNOSIS — Z0001 Encounter for general adult medical examination with abnormal findings: Secondary | ICD-10-CM | POA: Diagnosis not present

## 2022-11-28 DIAGNOSIS — Z9884 Bariatric surgery status: Secondary | ICD-10-CM

## 2022-11-28 DIAGNOSIS — E063 Autoimmune thyroiditis: Secondary | ICD-10-CM | POA: Diagnosis not present

## 2022-11-28 DIAGNOSIS — Z0000 Encounter for general adult medical examination without abnormal findings: Secondary | ICD-10-CM

## 2022-11-28 DIAGNOSIS — F410 Panic disorder [episodic paroxysmal anxiety] without agoraphobia: Secondary | ICD-10-CM

## 2022-11-28 DIAGNOSIS — F32A Depression, unspecified: Secondary | ICD-10-CM

## 2022-11-28 MED ORDER — CITALOPRAM HYDROBROMIDE 40 MG PO TABS: 40.0000 mg | ORAL_TABLET | Freq: Every day | ORAL | 3 refills | Status: DC

## 2022-11-28 MED ORDER — HYDROXYZINE HCL 25 MG PO TABS
12.5000 mg | ORAL_TABLET | Freq: Three times a day (TID) | ORAL | 0 refills | Status: DC | PRN
Start: 2022-11-28 — End: 2023-05-29

## 2022-11-28 MED ORDER — CYANOCOBALAMIN 1000 MCG/ML IJ SOLN
1000.0000 ug | Freq: Once | INTRAMUSCULAR | Status: AC
Start: 2022-11-28 — End: 2022-11-28
  Administered 2022-11-28: 1000 ug via INTRAMUSCULAR

## 2022-11-28 MED ORDER — BUSPIRONE HCL 15 MG PO TABS
15.0000 mg | ORAL_TABLET | Freq: Two times a day (BID) | ORAL | 3 refills | Status: AC
Start: 2022-11-28 — End: ?

## 2022-11-28 NOTE — Progress Notes (Signed)
Angela Johnson is a 37 y.o. female presents to office today for annual physical exam examination.    Concerns today include: 1.  None.  She reports that she has been doing really well.  She underwent gastric bypass surgery since I saw her last.  Did this in April and is already down to 100 pounds.  She has found some difficulty getting in the amount of protein they are recommending but she is keeping up with her water intake.  She is not taking a multivitamin and her B12 was noted be slightly low on her fasting lab draw.  Still had some mild elevations in triglycerides but she is finally out of the prediabetic range.  Surprisingly she has been out of her thyroid medicine for a month and a half and her thyroid levels were technically normal.  She reports good energy.  Reports no constipation, difficulty with weight loss etc.  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 [employer], Marital status: Married, Substance use: None Health Maintenance Due  Topic Date Due   Hepatitis C Screening  Never done   Refills needed today: all  Immunization History  Administered Date(s) Administered   Influenza,inj,Quad PF,6+ Mos 02/22/2018, 03/14/2019   Td 10/21/2021   Tdap 08/28/2011   Past Medical History:  Diagnosis Date   Alcohol abuse    history   Anxiety    Arthritis    Back pain    Depression    Drug abuse (HCC)    history   GERD (gastroesophageal reflux disease)    Gonorrhea 03/31/2005   Treated   Hypothyroidism    IBS (irritable bowel syndrome)    Other hyperlipidemia    Palpitations    Pre-diabetes    SOB (shortness of breath)    due to weight   Vitamin D deficiency    Social History   Socioeconomic History   Marital status: Married    Spouse name: Not on file   Number of children: Not on file   Years of education: Not on file   Highest education level: Not on file  Occupational History   Occupation: Customer service/ Counsellor  Tobacco Use    Smoking status: Former    Current packs/day: 0.00    Average packs/day: 2.0 packs/day for 4.0 years (8.0 ttl pk-yrs)    Types: Cigarettes    Start date: 03/31/2000    Quit date: 03/31/2004    Years since quitting: 18.6   Smokeless tobacco: Never  Vaping Use   Vaping status: Never Used  Substance and Sexual Activity   Alcohol use: No   Drug use: Not Currently    Types: "Crack" cocaine, Marijuana    Comment: quit 17 years ago   Sexual activity: Yes  Other Topics Concern   Not on file  Social History Narrative   Lives with husband and her 2 children   Right handed   Caffeine: 32 oz per day zero sugar soda or tea   Social Determinants of Health   Financial Resource Strain: Not on file  Food Insecurity: No Food Insecurity (07/30/2022)   Hunger Vital Sign    Worried About Running Out of Food in the Last Year: Never true    Ran Out of Food in the Last Year: Never true  Transportation Needs: No Transportation Needs (07/30/2022)   PRAPARE - Administrator, Civil Service (Medical): No    Lack of Transportation (Non-Medical): No  Physical Activity: Not on file  Stress: Not on file  Social Connections: Unknown (08/02/2021)   Received from [facility]   Social Network    Social Network: Not on file  Intimate Partner Violence: Not At Risk (07/28/2022)   Humiliation, Afraid, Rape, and Kick questionnaire    Fear of Current or Ex-Partner: No    Emotionally Abused: No    Physically Abused: No    Sexually Abused: No   Past Surgical History:  Procedure Laterality Date   GASTRIC ROUX-EN-Y N/A 07/28/2022   Procedure: LAPAROSCOPIC ROUX-EN-Y GASTRIC BYPASS WITH UPPER ENDOSCOPY;  Surgeon: Gaynelle Adu, MD;  Location: [facility];  Service: General;  Laterality: N/A;   oral     wisdom teeth   Family History  Problem Relation Age of Onset   Diabetes Mother    Hypertension Mother    Hyperlipidemia Mother    Depression Mother    Anxiety disorder Mother    Anxiety disorder Father     Depression Father    Hyperlipidemia Father    Thyroid disease Father    Sleep apnea Father    Sleep apnea Maternal Aunt    Sleep apnea Paternal Aunt    Colon cancer Paternal Grandmother     Current Outpatient Medications:    cetirizine (ZYRTEC) 10 MG tablet, Take 10 mg by mouth daily. allergies , Disp: , Rfl:    Cholecalciferol (VITAMIN D3 PO), Take 1 tablet by mouth daily., Disp: , Rfl:    fluticasone (FLONASE) 50 MCG/ACT nasal spray, Place 2 sprays into both nostrils daily. (Patient taking differently: Place 2 sprays into both nostrils daily as needed for allergies.), Disp: 16 g, Rfl: 6   levothyroxine (SYNTHROID) 100 MCG tablet, Take 1 tablet (100 mcg total) by mouth daily., Disp: 90 tablet, Rfl: 1   norgestrel-ethinyl estradiol (LO/OVRAL,CRYSELLE) 0.3-30 MG-MCG tablet, Take 1 tablet by mouth daily., Disp: , Rfl:    ondansetron (ZOFRAN-ODT) 4 MG disintegrating tablet, Take 1 tablet (4 mg total) by mouth every 6 (six) hours as needed for nausea or vomiting., Disp: 20 tablet, Rfl: 0   busPIRone (BUSPAR) 15 MG tablet, Take 1 tablet (15 mg total) by mouth 2 (two) times daily., Disp: 180 tablet, Rfl: 3   citalopram (CELEXA) 40 MG tablet, Take 1 tablet (40 mg total) by mouth daily., Disp: 90 tablet, Rfl: 3   hydrOXYzine (ATARAX) 25 MG tablet, Take 0.5-1 tablets (12.5-25 mg total) by mouth every 8 (eight) hours as needed for anxiety (panic attack)., Disp: 30 tablet, Rfl: 0  Allergies  Allergen Reactions   Ciprofloxacin Hcl Itching   Latex Itching    Power   Zithromax [Azithromycin]     dizziness     ROS: Review of Systems Pertinent items noted in HPI and remainder of comprehensive ROS otherwise negative.    Physical exam BP (!) 145/88   Pulse 78   Temp 98.2 F (36.8 C) (Temporal)   Resp 20   Ht 5\' 9"  (1.753 m)   Wt (!) 324 lb (147 kg)   SpO2 96%   BMI 47.85 kg/m  General appearance: alert, cooperative, appears stated age, and no distress Head: Normocephalic, without obvious  abnormality, atraumatic Eyes: negative findings: lids and lashes normal, conjunctivae and sclerae normal, corneas clear, and pupils equal, round, reactive to light and accomodation Ears: normal TM's and external ear canals both ears Nose: Nares normal. Septum midline. Mucosa normal. No drainage or sinus tenderness. Throat: lips, mucosa, and tongue normal; teeth and gums normal Neck: no adenopathy, supple, symmetrical, trachea midline, and  thyroid not enlarged, symmetric, no tenderness/mass/nodules Back: symmetric, no curvature. ROM normal. No CVA tenderness. Lungs: clear to auscultation bilaterally Heart: regular rate and rhythm, S1, S2 normal, no murmur, click, rub or gallop Abdomen: soft, non-tender; bowel sounds normal; no masses,  no organomegaly Extremities: extremities normal, atraumatic, no cyanosis or edema Pulses: 2+ and symmetric Skin: Skin color, texture, turgor normal. No rashes or lesions Lymph nodes: Cervical, supraclavicular, and axillary nodes normal. Neurologic: Grossly normal      11/28/2022    9:32 AM 06/02/2022    3:22 PM 11/20/2021    4:09 PM  Depression screen PHQ 2/9  Decreased Interest 0 0 1  Down, Depressed, Hopeless 0 0 1  PHQ - 2 Score 0 0 2  Altered sleeping 1  3  Tired, decreased energy 1  2  Change in appetite 0  1  Feeling bad or failure about yourself  0  0  Trouble concentrating 0  1  Moving slowly or fidgety/restless 0  0  Suicidal thoughts 0  0  PHQ-9 Score 2  9  Difficult doing work/chores Not difficult at all  Somewhat difficult      11/28/2022    9:32 AM 11/20/2021    4:09 PM 08/02/2021    2:06 PM 04/11/2021   10:52 AM  GAD 7 : Generalized Anxiety Score  Nervous, Anxious, on Edge 2 1 1  0  Control/stop worrying 1 1 0 0  Worry too much - different things 1 1 0 0  Trouble relaxing 1 0 0 0  Restless 1 0 0 0  Easily annoyed or irritable 0 1 0 0  Afraid - awful might happen 0 0 0 0  Total GAD 7 Score 6 4 1  0  Anxiety Difficulty Not difficult at  all Not difficult at all Not difficult at all Not difficult at all     Assessment/ Plan: Angela Johnson here for annual physical exam.   Annual physical exam  Hashimoto's thyroiditis - Plan: TSH, T4, free  Vitamin B12 deficiency - Plan: cyanocobalamin (VITAMIN B12) injection 1,000 mcg, Vitamin B12  Depressive disorder - Plan: busPIRone (BUSPAR) 15 MG tablet  Generalized anxiety disorder with panic attacks - Plan: citalopram (CELEXA) 40 MG tablet, hydrOXYzine (ATARAX) 25 MG tablet  History of gastric bypass - Plan: Vitamin B12, VITAMIN D 25 Hydroxy (Vit-D Deficiency, Fractures)  Hypertriglyceridemia - Plan: Lipid panel  Her thyroid labs are borderline but given that she is gone a month and a half without medication and she feels totally asymptomatic I think it is fine for [place] to just keep an eye on these levels.  We discussed if she starts having any clinical signs or symptoms of low thyroid to please reach out to me.  I placed standing orders to have labs rechecked in the next 3 to 6 months.  I went ahead and gave her a B12 injection encouraged her to take a multivitamin containing B12.  We discussed that given history of gastric bypass she likely has some malabsorption and will need this extra supplement.  Likely sublingual to be the most beneficial  I renewed her BuSpar and encouraged her to use this twice daily for breakthrough anxiety symptoms.  Celexa and Atarax also renewed  Future orders for B12, vitamin D and fasting lipid placed.  I am not planning on putting her on any medications for cholesterol at this time as she has just had gastric bypass and is actively losing weight and modifying lifestyle.  Will see  how things go and determine any need for intervention at a future date.  Counseled on healthy lifestyle choices, including diet (rich in fruits, vegetables and lean meats and low in salt and simple carbohydrates) and exercise (at least 30 minutes of moderate physical activity  daily).  Patient to follow up 3-39m  Marveline Profeta M. Nadine Counts, DO

## 2023-01-30 ENCOUNTER — Telehealth: Payer: BC Managed Care – PPO | Admitting: Family

## 2023-01-30 ENCOUNTER — Encounter: Payer: Self-pay | Admitting: Family

## 2023-01-30 DIAGNOSIS — Z20818 Contact with and (suspected) exposure to other bacterial communicable diseases: Secondary | ICD-10-CM

## 2023-01-30 DIAGNOSIS — J029 Acute pharyngitis, unspecified: Secondary | ICD-10-CM

## 2023-01-30 MED ORDER — AMOXICILLIN 500 MG PO CAPS
500.0000 mg | ORAL_CAPSULE | Freq: Two times a day (BID) | ORAL | 0 refills | Status: AC
Start: 2023-01-30 — End: 2023-02-09

## 2023-01-30 NOTE — Patient Instructions (Signed)

## 2023-01-30 NOTE — Progress Notes (Signed)
Virtual Visit Consent   Angela Johnson, you are scheduled for a virtual visit with a [facility] provider today. Just as with appointments in the office, your consent must be obtained to participate. Your consent will be active for this visit and any virtual visit you may have with one of our providers in the next 365 days. If you have a MyChart account, a copy of this consent can be sent to you electronically.  As this is a virtual visit, video technology does not allow for your provider to perform a traditional examination. This may limit your provider's ability to fully assess your condition. If your provider identifies any concerns that need to be evaluated in person or the need to arrange testing (such as labs, EKG, etc.), we will make arrangements to do so. Although advances in technology are sophisticated, we cannot ensure that it will always work on either your end or our end. If the connection with a video visit is poor, the visit may have to be switched to a telephone visit. With either a video or telephone visit, we are not always able to ensure that we have a secure connection.  By engaging in this virtual visit, you consent to the provision of healthcare and authorize for your insurance to be billed (if applicable) for the services provided during this visit. Depending on your insurance coverage, you may receive a charge related to this service.  I need to obtain your verbal consent now. Are you willing to proceed with your visit today? Angela Johnson has provided verbal consent on 01/30/2023 for a virtual visit (video or telephone). Angela Rodney, FNP  Date: 01/30/2023 10:51 AM  Virtual Visit via Video Note   I, Angela Johnson, connected with  Angela Johnson  ([id number], 1985/05/23) on 01/30/23 at 10:10 AM EDT by a video-enabled telemedicine application and verified that I am speaking with the correct person using two identifiers.  Location: Patient: Virtual Visit Location Patient:  Other: work Provider: Pharmacist, community: Home Office   I discussed the limitations of evaluation and management by telemedicine and the availability of in person appointments. The patient expressed understanding and agreed to proceed.    History of Present Illness: Angela Johnson is a 37 y.o. who identifies as a female who was assigned female at birth, and is being seen today for sore throat.  HPI: Sore Throat  This is a new problem. The current episode started yesterday. The problem has been gradually worsening. The pain is worse on the left side. There has been no fever. The pain is at a severity of 5/10. Associated symptoms include ear pain, swollen glands and trouble swallowing. Pertinent negatives include no congestion, coughing, headaches or shortness of breath. Associated symptoms comments: Chills . She has had exposure to strep. She has tried acetaminophen for the symptoms. The treatment provided mild relief.    Problems:  Patient Active Problem List   Diagnosis Date Noted   S/P gastric bypass 07/28/2022   OSA (obstructive sleep apnea) 11/12/2021   Lumbar radiculopathy 11/12/2021   Primary hypertension 08/02/2021   Hashimoto's thyroiditis 11/17/2019   Metabolic syndrome 11/15/2019   Elevated blood sugar 03/17/2019   Restless legs 09/21/2018   Hypersomnolence 06/16/2017   BMI 60.0-69.9, adult (HCC) 06/15/2017   Generalized anxiety disorder with panic attacks 10/09/2015   Elevated blood pressure reading 10/09/2015    Allergies:  Allergies  Allergen Reactions   Ciprofloxacin Hcl Itching   Latex Itching  Power   Zithromax [Azithromycin]     dizziness   Medications:  Current Outpatient Medications:    amoxicillin (AMOXIL) 500 MG capsule, Take 1 capsule (500 mg total) by mouth 2 (two) times daily for 10 days., Disp: 20 capsule, Rfl: 0   busPIRone (BUSPAR) 15 MG tablet, Take 1 tablet (15 mg total) by mouth 2 (two) times daily., Disp: 180 tablet, Rfl: 3    cetirizine (ZYRTEC) 10 MG tablet, Take 10 mg by mouth daily. allergies , Disp: , Rfl:    Cholecalciferol (VITAMIN D3 PO), Take 1 tablet by mouth daily., Disp: , Rfl:    citalopram (CELEXA) 40 MG tablet, Take 1 tablet (40 mg total) by mouth daily., Disp: 90 tablet, Rfl: 3   fluticasone (FLONASE) 50 MCG/ACT nasal spray, Place 2 sprays into both nostrils daily. (Patient taking differently: Place 2 sprays into both nostrils daily as needed for allergies.), Disp: 16 g, Rfl: 6   hydrOXYzine (ATARAX) 25 MG tablet, Take 0.5-1 tablets (12.5-25 mg total) by mouth every 8 (eight) hours as needed for anxiety (panic attack)., Disp: 30 tablet, Rfl: 0   levothyroxine (SYNTHROID) 100 MCG tablet, Take 1 tablet (100 mcg total) by mouth daily., Disp: 90 tablet, Rfl: 1   norgestrel-ethinyl estradiol (LO/OVRAL,CRYSELLE) 0.3-30 MG-MCG tablet, Take 1 tablet by mouth daily., Disp: , Rfl:    ondansetron (ZOFRAN-ODT) 4 MG disintegrating tablet, Take 1 tablet (4 mg total) by mouth every 6 (six) hours as needed for nausea or vomiting., Disp: 20 tablet, Rfl: 0  Observations/Objective: Patient is well-developed, well-nourished in no acute distress.  Resting comfortably  Head is normocephalic, atraumatic.  No labored breathing.  Speech is clear and coherent with logical content.  Patient is alert and oriented at baseline.  Tonsils mildly erythemas and white patch  Assessment and Plan: 1. Acute pharyngitis, unspecified etiology - amoxicillin (AMOXIL) 500 MG capsule; Take 1 capsule (500 mg total) by mouth 2 (two) times daily for 10 days.  Dispense: 20 capsule; Refill: 0  2. Exposure to strep throat - amoxicillin (AMOXIL) 500 MG capsule; Take 1 capsule (500 mg total) by mouth 2 (two) times daily for 10 days.  Dispense: 20 capsule; Refill: 0  - Take meds as prescribed - Use a cool mist humidifier  -Use saline nose sprays frequently -Force fluids -For any cough or congestion  Use plain Mucinex- regular strength or max  strength is fine -For fever or aces or pains- take tylenol or ibuprofen. -Throat lozenges if help -New toothbrush in 3 days -Follow up if symptoms worsen or do not improve   Follow Up Instructions: I discussed the assessment and treatment plan with the patient. The patient was provided an opportunity to ask questions and all were answered. The patient agreed with the plan and demonstrated an understanding of the instructions.  A copy of instructions were sent to the patient via MyChart unless otherwise noted below.    The patient was advised to call back or seek an in-person evaluation if the symptoms worsen or if the condition fails to improve as anticipated.    Angela Rodney, FNP

## 2023-02-04 ENCOUNTER — Encounter: Payer: Self-pay | Admitting: Family Medicine

## 2023-02-10 ENCOUNTER — Encounter: Payer: Self-pay | Admitting: Nurse Practitioner

## 2023-02-10 ENCOUNTER — Telehealth (INDEPENDENT_AMBULATORY_CARE_PROVIDER_SITE_OTHER): Payer: BC Managed Care – PPO | Admitting: Nurse Practitioner

## 2023-02-10 DIAGNOSIS — J40 Bronchitis, not specified as acute or chronic: Secondary | ICD-10-CM | POA: Diagnosis not present

## 2023-02-10 MED ORDER — BENZONATATE 100 MG PO CAPS: 100.0000 mg | ORAL_CAPSULE | Freq: Three times a day (TID) | ORAL | 0 refills | Status: DC | PRN

## 2023-02-10 MED ORDER — DOXYCYCLINE HYCLATE 100 MG PO TABS: 100.0000 mg | ORAL_TABLET | Freq: Two times a day (BID) | ORAL | 0 refills | Status: DC

## 2023-02-10 MED ORDER — AMOXICILLIN-POT CLAVULANATE 875-125 MG PO TABS: 1.0000 | ORAL_TABLET | Freq: Two times a day (BID) | ORAL | 0 refills | Status: DC

## 2023-02-10 NOTE — Progress Notes (Signed)
Virtual Visit Consent   Angela Johnson, you are scheduled for a virtual visit with Angela Daphine Deutscher, FNP, a [facility] provider, today.     Just as with appointments in the office, your consent must be obtained to participate.  Your consent will be active for this visit and any virtual visit you may have with one of our providers in the next 365 days.     If you have a MyChart account, a copy of this consent can be sent to you electronically.  All virtual visits are billed to your insurance company just like a traditional visit in the office.    As this is a virtual visit, video technology does not allow for your provider to perform a traditional examination.  This may limit your provider's ability to fully assess your condition.  If your provider identifies any concerns that need to be evaluated in person or the need to arrange testing (such as labs, EKG, etc.), we will make arrangements to do so.     Although advances in technology are sophisticated, we cannot ensure that it will always work on either your end or our end.  If the connection with a video visit is poor, the visit may have to be switched to a telephone visit.  With either a video or telephone visit, we are not always able to ensure that we have a secure connection.     I need to obtain your verbal consent now.   Are you willing to proceed with your visit today? YES   Angela Johnson has provided verbal consent on 02/10/2023 for a virtual visit (video or telephone).   Angela Daphine Deutscher, FNP   Date: 02/10/2023 11:06 AM   Virtual Visit via Video Note   I, Angela Johnson, connected with Angela Johnson ([id number], Jul 05, 1985) on 02/10/23 at 11:45 AM EST by a video-enabled telemedicine application and verified that I am speaking with the correct person using two identifiers.  Location: Patient: Virtual Visit Location Patient: Home Provider: Virtual Visit Location Provider: Mobile   I discussed the limitations  of evaluation and management by telemedicine and the availability of in person appointments. The patient expressed understanding and agreed to proceed.    History of Present Illness: Angela Johnson is a 37 y.o. who identifies as a female who was assigned female at birth, and is being seen today for sore throat.  HPI: Sore Throat  This is a new problem. The current episode started in the past 7 days. The problem has been waxing and waning. Neither side of throat is experiencing more pain than the other. The maximum temperature recorded prior to her arrival was 100.4 - 100.9 F. The fever has been present for 1 to 2 days. The pain is at a severity of 0/10. Associated symptoms include congestion and coughing. Pertinent negatives include no shortness of breath or trouble swallowing. She has had no exposure to strep. She has tried acetaminophen (nyquil) for the symptoms. The treatment provided mild relief.    Review of Systems  HENT:  Positive for congestion. Negative for trouble swallowing.   Respiratory:  Positive for cough. Negative for shortness of breath.     Problems:  Patient Active Problem List   Diagnosis Date Noted   S/P gastric bypass 07/28/2022   OSA (obstructive sleep apnea) 11/12/2021   Lumbar radiculopathy 11/12/2021   Primary hypertension 08/02/2021   Hashimoto's thyroiditis 11/17/2019   Metabolic syndrome 11/15/2019   Elevated blood sugar 03/17/2019  Restless legs 09/21/2018   Hypersomnolence 06/16/2017   BMI 60.0-69.9, adult (HCC) 06/15/2017   Generalized anxiety disorder with panic attacks 10/09/2015   Elevated blood pressure reading 10/09/2015    Allergies:  Allergies  Allergen Reactions   Ciprofloxacin Hcl Itching   Latex Itching    Power   Zithromax [Azithromycin]     dizziness   Medications:  Current Outpatient Medications:    busPIRone (BUSPAR) 15 MG tablet, Take 1 tablet (15 mg total) by mouth 2 (two) times daily., Disp: 180 tablet, Rfl: 3   cetirizine  (ZYRTEC) 10 MG tablet, Take 10 mg by mouth daily. allergies , Disp: , Rfl:    Cholecalciferol (VITAMIN D3 PO), Take 1 tablet by mouth daily., Disp: , Rfl:    citalopram (CELEXA) 40 MG tablet, Take 1 tablet (40 mg total) by mouth daily., Disp: 90 tablet, Rfl: 3   fluticasone (FLONASE) 50 MCG/ACT nasal spray, Place 2 sprays into both nostrils daily. (Patient taking differently: Place 2 sprays into both nostrils daily as needed for allergies.), Disp: 16 g, Rfl: 6   hydrOXYzine (ATARAX) 25 MG tablet, Take 0.5-1 tablets (12.5-25 mg total) by mouth every 8 (eight) hours as needed for anxiety (panic attack)., Disp: 30 tablet, Rfl: 0   levothyroxine (SYNTHROID) 100 MCG tablet, Take 1 tablet (100 mcg total) by mouth daily., Disp: 90 tablet, Rfl: 1   norgestrel-ethinyl estradiol (LO/OVRAL,CRYSELLE) 0.3-30 MG-MCG tablet, Take 1 tablet by mouth daily., Disp: , Rfl:    ondansetron (ZOFRAN-ODT) 4 MG disintegrating tablet, Take 1 tablet (4 mg total) by mouth every 6 (six) hours as needed for nausea or vomiting., Disp: 20 tablet, Rfl: 0  Observations/Objective: Patient is well-developed, well-nourished in no acute distress.  Resting comfortably  at home.  Head is normocephalic, atraumatic.  No labored breathing.  Speech is clear and coherent with logical content.  Patient is alert and oriented at baseline.  Raspy voice Deep cough No maxillary pressure  Assessment and Plan:  Angela Johnson in today with chief complaint of Sore Throat   1. Bronchitis 1. Take meds as prescribed 2. Use a cool mist humidifier especially during the winter months and when heat has been humid. 3. Use saline nose sprays frequently 4. Saline irrigations of the nose can be very helpful if done frequently.  * 4X daily for 1 week*  * Use of a nettie pot can be helpful with this. Follow directions with this* 5. Drink plenty of fluids 6. Keep thermostat turn down low 7.For any cough or congestion- tessalon perles 8. For fever  or aces or pains- take tylenol or ibuprofen appropriate for age and weight.  * for fevers greater than 101 orally you may alternate ibuprofen and tylenol every  3 hours.   Meds ordered this encounter  Medications   benzonatate (TESSALON PERLES) 100 MG capsule    Sig: Take 1 capsule (100 mg total) by mouth 3 (three) times daily as needed for cough.    Dispense:  20 capsule    Refill:  0    Order Specific Question:   Supervising Provider    Answer:   Arville Care A [[id number]]   doxycycline (VIBRA-TABS) 100 MG tablet    Sig: Take 1 tablet (100 mg total) by mouth 2 (two) times daily. 1 po bid    Dispense:  20 tablet    Refill:  0    Order Specific Question:   Supervising Provider    Answer:   Arville Care A [id number]  Follow Up Instructions: I discussed the assessment and treatment plan with the patient. The patient was provided an opportunity to ask questions and all were answered. The patient agreed with the plan and demonstrated an understanding of the instructions.  A copy of instructions were sent to the patient via MyChart.  The patient was advised to call back or seek an in-person evaluation if the symptoms worsen or if the condition fails to improve as anticipated.  Time:  I spent 6 minutes with the patient via telehealth technology discussing the above problems/concerns.    Angela Daphine Deutscher, FNP

## 2023-02-10 NOTE — Patient Instructions (Addendum)

## 2023-02-10 NOTE — Telephone Encounter (Signed)
Changed to augmentin

## 2023-02-12 ENCOUNTER — Ambulatory Visit: Payer: BC Managed Care – PPO

## 2023-02-12 ENCOUNTER — Ambulatory Visit: Payer: BC Managed Care – PPO | Admitting: Family Medicine

## 2023-02-12 ENCOUNTER — Ambulatory Visit (INDEPENDENT_AMBULATORY_CARE_PROVIDER_SITE_OTHER): Payer: BC Managed Care – PPO

## 2023-02-12 ENCOUNTER — Encounter: Payer: Self-pay | Admitting: Family Medicine

## 2023-02-12 VITALS — BP 143/85 | HR 72 | Temp 98.3°F | Ht 69.0 in | Wt 308.2 lb

## 2023-02-12 DIAGNOSIS — R0602 Shortness of breath: Secondary | ICD-10-CM | POA: Diagnosis not present

## 2023-02-12 DIAGNOSIS — R918 Other nonspecific abnormal finding of lung field: Secondary | ICD-10-CM | POA: Diagnosis not present

## 2023-02-12 DIAGNOSIS — J189 Pneumonia, unspecified organism: Secondary | ICD-10-CM | POA: Diagnosis not present

## 2023-02-12 DIAGNOSIS — Z9884 Bariatric surgery status: Secondary | ICD-10-CM | POA: Diagnosis not present

## 2023-02-12 DIAGNOSIS — R051 Acute cough: Secondary | ICD-10-CM | POA: Diagnosis not present

## 2023-02-12 DIAGNOSIS — I517 Cardiomegaly: Secondary | ICD-10-CM | POA: Diagnosis not present

## 2023-02-12 DIAGNOSIS — R059 Cough, unspecified: Secondary | ICD-10-CM | POA: Diagnosis not present

## 2023-02-12 MED ORDER — METHYLPREDNISOLONE ACETATE 80 MG/ML IJ SUSP
80.0000 mg | Freq: Once | INTRAMUSCULAR | Status: AC
  Administered 2023-02-12: 80 mg via INTRAMUSCULAR

## 2023-02-12 MED ORDER — OMEPRAZOLE 20 MG PO CPDR: 20.0000 mg | DELAYED_RELEASE_CAPSULE | Freq: Every day | ORAL | 0 refills | Status: DC

## 2023-02-12 MED ORDER — PREDNISONE 20 MG PO TABS: 40.0000 mg | ORAL_TABLET | Freq: Every day | ORAL | 0 refills | Status: AC

## 2023-02-12 MED ORDER — DOXYCYCLINE HYCLATE 100 MG PO TABS: 100.0000 mg | ORAL_TABLET | Freq: Two times a day (BID) | ORAL | 0 refills | Status: AC

## 2023-02-12 MED ORDER — ALBUTEROL SULFATE HFA 108 (90 BASE) MCG/ACT IN AERS: 2.0000 | INHALATION_SPRAY | Freq: Four times a day (QID) | RESPIRATORY_TRACT | 0 refills | Status: AC | PRN

## 2023-02-12 NOTE — Progress Notes (Signed)
Acute Office Visit  Subjective:     Patient ID: Angela Johnson, female    DOB: 07-29-1985, 37 y.o.   MRN: [id number]  Chief Complaint  Patient presents with   Bronchitis    Cough This is a new problem. The current episode started in the past 7 days. The problem has been gradually worsening. The cough is Non-productive. Associated symptoms include a fever (initially, no afebrile), headaches, myalgias (now resolved), shortness of breath (with coughing fits) and wheezing. Pertinent negatives include no chest pain, chills, ear congestion, ear pain, nasal congestion or sore throat. She has tried prescription cough suppressant and OTC cough suppressant (augmentin) for the symptoms. The treatment provided no relief. Her past medical history is significant for bronchitis. There is no history of asthma, COPD, emphysema or pneumonia.   Has had negative home Covid and flu test.  Review of Systems  Constitutional:  Positive for fever (initially, no afebrile). Negative for chills.  HENT:  Negative for ear pain and sore throat.   Respiratory:  Positive for cough, shortness of breath (with coughing fits) and wheezing.   Cardiovascular:  Negative for chest pain.  Musculoskeletal:  Positive for myalgias (now resolved).  Neurological:  Positive for headaches.        Objective:    BP (!) 143/85   Pulse 72   Temp 98.3 F (36.8 C) (Temporal)   Ht 5\' 9"  (1.753 m)   Wt (!) 308 lb 4 oz (139.8 kg)   SpO2 93%   BMI 45.52 kg/m  Wt Readings from Last 3 Encounters:  02/12/23 (!) 308 lb 4 oz (139.8 kg)  11/28/22 (!) 324 lb (147 kg)  08/12/22 (!) 368 lb 12.8 oz (167.3 kg)     Physical Exam Vitals and nursing note reviewed.  Constitutional:      General: She is not in acute distress.    Appearance: She is obese. She is not ill-appearing, toxic-appearing or diaphoretic.  HENT:     Right Ear: Tympanic membrane, ear canal and external ear normal.     Left Ear: Tympanic membrane, ear canal and  external ear normal.     Nose: Congestion present.  Cardiovascular:     Rate and Rhythm: Normal rate and regular rhythm.     Heart sounds: Normal heart sounds. No murmur heard. Pulmonary:     Effort: Pulmonary effort is normal.     Breath sounds: Examination of the right-upper field reveals rhonchi. Examination of the left-upper field reveals rhonchi. Examination of the right-middle field reveals rhonchi. Examination of the left-middle field reveals rhonchi. Examination of the right-lower field reveals rhonchi. Examination of the left-lower field reveals rhonchi. Rhonchi present.  Musculoskeletal:     Cervical back: Neck supple. No rigidity.     Right lower leg: No edema.     Left lower leg: No edema.  Skin:    General: Skin is warm and dry.  Neurological:     General: No focal deficit present.     Mental Status: She is alert and oriented to person, place, and time.    No results found for any visits on 02/12/23.      Assessment & Plan:   Gilberto was seen today for bronchitis.  Diagnoses and all orders for this visit:  Acute cough -     DG Chest 2 View; Future -     predniSONE (DELTASONE) 20 MG tablet; Take 2 tablets (40 mg total) by mouth daily with breakfast for 5 days. Start tomorrow. -  methylPREDNISolone acetate (DEPO-MEDROL) injection 80 mg -     albuterol (VENTOLIN HFA) 108 (90 Base) MCG/ACT inhaler; Inhale 2 puffs into the lungs every 6 (six) hours as needed for wheezing or shortness of breath.  Shortness of breath -     DG Chest 2 View; Future -     predniSONE (DELTASONE) 20 MG tablet; Take 2 tablets (40 mg total) by mouth daily with breakfast for 5 days. Start tomorrow. -     methylPREDNISolone acetate (DEPO-MEDROL) injection 80 mg -     albuterol (VENTOLIN HFA) 108 (90 Base) MCG/ACT inhaler; Inhale 2 puffs into the lungs every 6 (six) hours as needed for wheezing or shortness of breath.  Pneumonia of both lower lobes due to infectious organism -     doxycycline  (VIBRA-TABS) 100 MG tablet; Take 1 tablet (100 mg total) by mouth 2 (two) times daily for 7 days.  Hx of gastric bypass -     omeprazole (PRILOSEC) 20 MG capsule; Take 1 capsule (20 mg total) by mouth daily.   CXR concerning for PNA today. Radiology read agrees. Will switch from augmentin to doxycycline as she is unable to take macrolides due to allergy. Take PPI with doxycyline. Steroid IM today in office with prednisone burst to start tomorrow. Will have her follow up in 3-4 weeks for repeat CXR. Sooner for new or worsening symptoms.   The patient indicates understanding of these issues and agrees with the plan.  Gabriel Earing, FNP

## 2023-02-13 ENCOUNTER — Encounter: Payer: Self-pay | Admitting: Family Medicine

## 2023-03-12 ENCOUNTER — Ambulatory Visit: Payer: BC Managed Care – PPO | Admitting: Family Medicine

## 2023-03-30 ENCOUNTER — Telehealth: Payer: Self-pay | Admitting: Family Medicine

## 2023-03-30 NOTE — Telephone Encounter (Signed)
NTBS.

## 2023-03-30 NOTE — Telephone Encounter (Signed)
Pt scheduled 04/06/23 at 1:30 with Tiffany.

## 2023-04-06 ENCOUNTER — Ambulatory Visit: Payer: BC Managed Care – PPO | Admitting: Family Medicine

## 2023-04-21 DIAGNOSIS — S93402A Sprain of unspecified ligament of left ankle, initial encounter: Secondary | ICD-10-CM | POA: Diagnosis not present

## 2023-04-21 DIAGNOSIS — M25572 Pain in left ankle and joints of left foot: Secondary | ICD-10-CM | POA: Diagnosis not present

## 2023-05-29 ENCOUNTER — Encounter: Payer: Self-pay | Admitting: Family Medicine

## 2023-05-29 ENCOUNTER — Ambulatory Visit: Payer: BC Managed Care – PPO | Admitting: Family Medicine

## 2023-05-29 VITALS — BP 157/108 | HR 64 | Temp 98.4°F | Ht 69.0 in | Wt 303.4 lb

## 2023-05-29 DIAGNOSIS — E538 Deficiency of other specified B group vitamins: Secondary | ICD-10-CM

## 2023-05-29 DIAGNOSIS — Z23 Encounter for immunization: Secondary | ICD-10-CM | POA: Diagnosis not present

## 2023-05-29 DIAGNOSIS — Z9884 Bariatric surgery status: Secondary | ICD-10-CM | POA: Diagnosis not present

## 2023-05-29 DIAGNOSIS — E063 Autoimmune thyroiditis: Secondary | ICD-10-CM | POA: Diagnosis not present

## 2023-05-29 DIAGNOSIS — I10 Essential (primary) hypertension: Secondary | ICD-10-CM

## 2023-05-29 DIAGNOSIS — E781 Pure hyperglyceridemia: Secondary | ICD-10-CM

## 2023-05-29 MED ORDER — AMLODIPINE BESYLATE 5 MG PO TABS: 5.0000 mg | ORAL_TABLET | Freq: Every day | ORAL | 1 refills | Status: DC

## 2023-05-29 NOTE — Patient Instructions (Signed)
Headache supplements: - Magnesium citrate 400mg daily - Riboflavin 400mg daily (B2) - coenzyme Q10 100mg three times daily  Maintain a headache diary.  Bring this to your next appointment Make sure you are getting plenty of water and rest.   

## 2023-05-29 NOTE — Progress Notes (Deleted)
   Acute Office Visit  Subjective:     Patient ID: Angela Johnson, female    DOB: Apr 03, 1985, 38 y.o.   MRN: [id number]  Chief Complaint  Patient presents with   Medical Management of Chronic Issues    HPI Patient is in today for ***  ROS      Objective:    BP (!) 140/94   Pulse 64   Temp 98.4 F (36.9 C)   Ht 5\' 9"  (1.753 m)   Wt (!) 303 lb 6.4 oz (137.6 kg)   LMP 04/28/2023   SpO2 95%   BMI 44.80 kg/m  {Vitals History (Optional):23777}  Physical Exam  No results found for any visits on 05/29/23.      Assessment & Plan:   Problem List Items Addressed This Visit       Endocrine   Hashimoto's thyroiditis - Primary   Other Visit Diagnoses       Hypertriglyceridemia       Relevant Orders   CMP14+EGFR     Hx of gastric bypass       Relevant Orders   Folate   Iron, TIBC and Ferritin Panel   CMP14+EGFR     Encounter for immunization       Relevant Orders   Flu vaccine trivalent PF, 6mos and older(Flulaval,Afluria,Fluarix,Fluzone) (Completed)       No orders of the defined types were placed in this encounter.   No follow-ups on file.  Waynette Buttery, CMA

## 2023-05-29 NOTE — Progress Notes (Signed)
 Subjective: CC: Follow-up status post gastric bypass PCP: Raliegh Ip, DO MWN:UUVOZ [name] is a 38 y.o. female presenting to clinic today for:  1.  Obesity with history of gastric bypass/ HTN Patient had gastric bypass last spring.  She is been doing extremely well reducing her weight gradually each week.  She has been compliant with diet as prescribed.  She has come off of all medications except for birth control, Celexa and BuSpar.  She has noticed some blood pressures that have been elevated.  She reports occasional headaches.  Not able to take NSAIDs secondary to gastric bypass surgery.  2.  Hypothyroidism Previously treated with Synthroid but discontinued medication over 10 months ago.  Thinking she might need to go back on the medication because she does admit to some sluggishness.  Does take biotin but last dose over 30 days ago.  Reports some hair loss   ROS: Per HPI  Allergies  Allergen Reactions   Ciprofloxacin Hcl Itching   Doxycycline Other (See Comments)    Was told not to take after gastric bypass   Latex Itching    Power   Zithromax [Azithromycin]     dizziness   Past Medical History:  Diagnosis Date   Alcohol abuse    history   Anxiety    Arthritis    Back pain    Depression    Drug abuse (HCC)    history   GERD (gastroesophageal reflux disease)    Gonorrhea 03/31/2005   Treated   Hypothyroidism    IBS (irritable bowel syndrome)    Other hyperlipidemia    Palpitations    Pre-diabetes    SOB (shortness of breath)    due to weight   Vitamin D deficiency     Current Outpatient Medications:    albuterol (VENTOLIN HFA) 108 (90 Base) MCG/ACT inhaler, Inhale 2 puffs into the lungs every 6 (six) hours as needed for wheezing or shortness of breath., Disp: 18 g, Rfl: 0   benzonatate (TESSALON PERLES) 100 MG capsule, Take 1 capsule (100 mg total) by mouth 3 (three) times daily as needed for cough., Disp: 20 capsule, Rfl: 0   busPIRone (BUSPAR) 15  MG tablet, Take 1 tablet (15 mg total) by mouth 2 (two) times daily., Disp: 180 tablet, Rfl: 3   CALCIUM PO, Take 500 mg by mouth in the morning, at noon, and at bedtime., Disp: , Rfl:    cetirizine (ZYRTEC) 10 MG tablet, Take 10 mg by mouth daily. allergies , Disp: , Rfl:    Cholecalciferol (VITAMIN D3 PO), Take 1 tablet by mouth daily., Disp: , Rfl:    citalopram (CELEXA) 40 MG tablet, Take 1 tablet (40 mg total) by mouth daily., Disp: 90 tablet, Rfl: 3   fluticasone (FLONASE) 50 MCG/ACT nasal spray, Place 2 sprays into both nostrils daily. (Patient taking differently: Place 2 sprays into both nostrils daily as needed for allergies.), Disp: 16 g, Rfl: 6   hydrOXYzine (ATARAX) 25 MG tablet, Take 0.5-1 tablets (12.5-25 mg total) by mouth every 8 (eight) hours as needed for anxiety (panic attack)., Disp: 30 tablet, Rfl: 0   levothyroxine (SYNTHROID) 100 MCG tablet, Take 1 tablet (100 mcg total) by mouth daily. (Patient not taking: Reported on 02/12/2023), Disp: 90 tablet, Rfl: 1   Multiple Vitamin (MULTI-VITAMIN) tablet, Take 1 tablet by mouth daily., Disp: , Rfl:    norgestrel-ethinyl estradiol (LO/OVRAL,CRYSELLE) 0.3-30 MG-MCG tablet, Take 1 tablet by mouth daily., Disp: , Rfl:  omeprazole (PRILOSEC) 20 MG capsule, Take 1 capsule (20 mg total) by mouth daily., Disp: 30 capsule, Rfl: 0   ondansetron (ZOFRAN-ODT) 4 MG disintegrating tablet, Take 1 tablet (4 mg total) by mouth every 6 (six) hours as needed for nausea or vomiting., Disp: 20 tablet, Rfl: 0 Social History   Socioeconomic History   Marital status: Married    Spouse name: Not on file   Number of children: Not on file   Years of education: Not on file   Highest education level: Not on file  Occupational History   Occupation: Customer service/ Counsellor  Tobacco Use   Smoking status: Former    Current packs/day: 0.00    Average packs/day: 2.0 packs/day for 4.0 years (8.0 ttl pk-yrs)    Types: Cigarettes    Start date:  03/31/2000    Quit date: 03/31/2004    Years since quitting: 19.1   Smokeless tobacco: Never  Vaping Use   Vaping status: Never Used  Substance and Sexual Activity   Alcohol use: No   Drug use: Not Currently    Types: "Crack" cocaine, Marijuana    Comment: quit 17 years ago   Sexual activity: Yes  Other Topics Concern   Not on file  Social History Narrative   Lives with husband and her 2 children   Right handed   Caffeine: 32 oz per day zero sugar soda or tea   Social Drivers of Corporate investment banker Strain: Not on file  Food Insecurity: No Food Insecurity (07/30/2022)   Hunger Vital Sign    Worried About Running Out of Food in the Last Year: Never true    Ran Out of Food in the Last Year: Never true  Transportation Needs: No Transportation Needs (07/30/2022)   PRAPARE - Administrator, Civil Service (Medical): No    Lack of Transportation (Non-Medical): No  Physical Activity: Not on file  Stress: Not on file  Social Connections: Unknown (08/02/2021)   Received from [facility], [facility]   Social Network    Social Network: Not on file  Intimate Partner Violence: Not At Risk (07/28/2022)   Humiliation, Afraid, Rape, and Kick questionnaire    Fear of Current or Ex-Partner: No    Emotionally Abused: No    Physically Abused: No    Sexually Abused: No   Family History  Problem Relation Age of Onset   Diabetes Mother    Hypertension Mother    Hyperlipidemia Mother    Depression Mother    Anxiety disorder Mother    Anxiety disorder Father    Depression Father    Hyperlipidemia Father    Thyroid disease Father    Sleep apnea Father    Sleep apnea Maternal Aunt    Sleep apnea Paternal Aunt    Colon cancer Paternal Grandmother     Objective: Office vital signs reviewed. BP (!) 157/108   Pulse 64   Temp 98.4 F (36.9 C)   Ht 5\' 9"  (1.753 m)   Wt (!) 303 lb 6.4 oz (137.6 kg)   LMP 04/28/2023   SpO2 95%   BMI 44.80 kg/m   Physical Examination:   General: Awake, alert, well-appearing female, No acute distress HEENT: Sclera white.  No exophthalmos Cardio: regular rate and rhythm, S1S2 heard, no murmurs appreciated Pulm: clear to auscultation bilaterally, no wheezes, rhonchi or rales; normal work of breathing on room air    Assessment/ Plan: 38 y.o. female   Hashimoto's thyroiditis -  Plan: T4, free, TSH  Hypertriglyceridemia - Plan: CMP14+EGFR, Lipid panel  Hx of gastric bypass - Plan: Folate, Iron, TIBC and Ferritin Panel, CMP14+EGFR, VITAMIN D 25 Hydroxy (Vit-D Deficiency, Fractures), Vitamin B12  Encounter for immunization - Plan: Flu vaccine trivalent PF, 6mos and older(Flulaval,Afluria,Fluarix,Fluzone)  Vitamin B12 deficiency - Plan: Vitamin B12  Essential hypertension - Plan: amLODipine (NORVASC) 5 MG tablet  Fasting labs collected today.  Will certainly recheck thyroid levels, vitamin levels given bypass and known deficiency.  I would like her to trial on magnesium and other preventative vitamins for headache.  I am going to go ahead and start her on antihypertensive in efforts to reduce blood pressure as this was still elevated and even further so upon recheck.  Will plan to gradually titrate this off as she continues to reach weight loss goals.  Would like to see her back again in the next 3 months for recheck.  Continue to monitor at home with goal of less than 140/90   Raliegh Ip, DO [facility] [phone]

## 2023-06-01 ENCOUNTER — Other Ambulatory Visit: Payer: Self-pay | Admitting: Family Medicine

## 2023-06-01 ENCOUNTER — Encounter: Payer: Self-pay | Admitting: Family Medicine

## 2023-06-01 DIAGNOSIS — E559 Vitamin D deficiency, unspecified: Secondary | ICD-10-CM

## 2023-06-01 DIAGNOSIS — Z9884 Bariatric surgery status: Secondary | ICD-10-CM

## 2023-06-01 DIAGNOSIS — E063 Autoimmune thyroiditis: Secondary | ICD-10-CM

## 2023-06-01 LAB — CMP14+EGFR
ALT: 23 IU/L (ref 0–32)
AST: 25 IU/L (ref 0–40)
Albumin: 4.3 g/dL (ref 3.9–4.9)
Alkaline Phosphatase: 94 IU/L (ref 44–121)
BUN/Creatinine Ratio: 14 (ref 9–23)
BUN: 11 mg/dL (ref 6–20)
Bilirubin Total: 0.4 mg/dL (ref 0.0–1.2)
CO2: 25 mmol/L (ref 20–29)
Calcium: 9.3 mg/dL (ref 8.7–10.2)
Chloride: 104 mmol/L (ref 96–106)
Creatinine, Ser: 0.78 mg/dL (ref 0.57–1.00)
Globulin, Total: 2.5 g/dL (ref 1.5–4.5)
Glucose: 85 mg/dL (ref 70–99)
Potassium: 4.4 mmol/L (ref 3.5–5.2)
Sodium: 141 mmol/L (ref 134–144)
Total Protein: 6.8 g/dL (ref 6.0–8.5)
eGFR: 100 mL/min/{1.73_m2} (ref >59)

## 2023-06-01 LAB — IRON,TIBC AND FERRITIN PANEL
Ferritin: 76 ng/mL (ref 15–150)
Iron Saturation: 21 % (ref 15–55)
Iron: 85 ug/dL (ref 27–159)
Total Iron Binding Capacity: 396 ug/dL (ref 250–450)
UIBC: 311 ug/dL (ref 131–425)

## 2023-06-01 LAB — LIPID PANEL
Chol/HDL Ratio: 5.5 ratio — ABNORMAL HIGH (ref 0.0–4.4)
Cholesterol, Total: 205 mg/dL — ABNORMAL HIGH (ref 100–199)
HDL: 37 mg/dL — ABNORMAL LOW (ref >39)
LDL Chol Calc (NIH): 135 mg/dL — ABNORMAL HIGH (ref 0–99)
Triglycerides: 185 mg/dL — ABNORMAL HIGH (ref 0–149)
VLDL Cholesterol Cal: 33 mg/dL (ref 5–40)

## 2023-06-01 LAB — VITAMIN D 25 HYDROXY (VIT D DEFICIENCY, FRACTURES): Vit D, 25-Hydroxy: 16.8 ng/mL — ABNORMAL LOW (ref 30.0–100.0)

## 2023-06-01 LAB — TSH: TSH: 2.84 u[IU]/mL (ref 0.450–4.500)

## 2023-06-01 LAB — VITAMIN B12: Vitamin B-12: 301 pg/mL (ref 232–1245)

## 2023-06-01 LAB — FOLATE: Folate: 6.2 ng/mL (ref >3.0)

## 2023-06-01 LAB — T4, FREE: Free T4: 0.87 ng/dL (ref 0.82–1.77)

## 2023-06-01 MED ORDER — VITAMIN D (ERGOCALCIFEROL) 1.25 MG (50000 UNIT) PO CAPS: 50000.0000 [IU] | ORAL_CAPSULE | ORAL | 3 refills | Status: DC

## 2023-06-01 MED ORDER — LEVOTHYROXINE SODIUM 25 MCG PO TABS: 25.0000 ug | ORAL_TABLET | Freq: Every day | ORAL | 3 refills | Status: DC

## 2023-06-15 ENCOUNTER — Ambulatory Visit: Payer: BC Managed Care – PPO | Admitting: Family Medicine

## 2023-07-15 ENCOUNTER — Ambulatory Visit: Admitting: Family Medicine

## 2023-08-31 ENCOUNTER — Ambulatory Visit: Admitting: Family Medicine

## 2023-09-07 ENCOUNTER — Encounter: Payer: Self-pay | Admitting: Family Medicine

## 2023-09-07 ENCOUNTER — Ambulatory Visit: Admitting: Family Medicine

## 2023-09-07 NOTE — Progress Notes (Deleted)
 Subjective: CC:HTN, Hypothyroidism PCP: Eliodoro Guerin, DO Angela Johnson is a 38 y.o. female presenting to clinic today for:  1. HTN Started on Norvasc  5mg  last OV. ***  2. Hashimoto's thyroiditis Had subclinical symptoms, started on Synthroid  25mcg last OV. Reports fatigue ***   ROS: Per HPI  Allergies  Allergen Reactions   Ciprofloxacin  Hcl Itching   Doxycycline  Other (See Comments)    Was told not to take after gastric bypass   Latex Itching    Power   Zithromax  [place]anny ]     dizziness   Past Medical History:  Diagnosis Date   Alcohol abuse    history   Anxiety    Arthritis    Back pain    Depression    Drug abuse (HCC)    history   GERD (gastroesophageal reflux disease)    Gonorrhea 03/31/2005   Treated   Hypothyroidism    IBS (irritable bowel syndrome)    Other hyperlipidemia    Palpitations    Pre-diabetes    SOB (shortness of breath)    due to weight   Vitamin D  deficiency     Current Outpatient Medications:    albuterol  (VENTOLIN  HFA) 108 (90 Base) MCG/ACT inhaler, Inhale 2 puffs into the lungs every 6 (six) hours as needed for wheezing or shortness of breath., Disp: 18 g, Rfl: 0   amLODipine  (NORVASC ) 5 MG tablet, Take 1 tablet (5 mg total) by mouth daily. For hypertension, Disp: 90 tablet, Rfl: 1   busPIRone  (BUSPAR ) 15 MG tablet, Take 1 tablet (15 mg total) by mouth 2 (two) times daily., Disp: 180 tablet, Rfl: 3   CALCIUM PO, Take 500 mg by mouth in the morning, at noon, and at bedtime., Disp: , Rfl:    cetirizine (ZYRTEC) 10 MG tablet, Take 10 mg by mouth daily. allergies , Disp: , Rfl:    citalopram  (CELEXA ) 40 MG tablet, Take 1 tablet (40 mg total) by mouth daily., Disp: 90 tablet, Rfl: 3   fluticasone  (FLONASE ) 50 MCG/ACT nasal spray, Place 2 sprays into both nostrils daily. (Patient taking differently: Place 2 sprays into both nostrils daily as needed for allergies.), Disp: 16 g, Rfl: 6   levothyroxine  (SYNTHROID ) 25 MCG  tablet, Take 1 tablet (25 mcg total) by mouth daily., Disp: 90 tablet, Rfl: 3   Multiple Vitamin (MULTI-VITAMIN) tablet, Take 1 tablet by mouth daily., Disp: , Rfl:    norgestrel-ethinyl estradiol (LO/OVRAL,CRYSELLE) 0.3-30 MG-MCG tablet, Take 1 tablet by mouth daily., Disp: , Rfl:    ondansetron  (ZOFRAN -ODT) 4 MG disintegrating tablet, Take 1 tablet (4 mg total) by mouth every 6 (six) hours as needed for nausea or vomiting., Disp: 20 tablet, Rfl: 0   Vitamin D , Ergocalciferol , (DRISDOL ) 1.25 MG (50000 UNIT) CAPS capsule, Take 1 capsule (50,000 Units total) by mouth every 7 (seven) days., Disp: 12 capsule, Rfl: 3 Social History   Socioeconomic History   Marital status: Married    Spouse name: Not on file   Number of children: Not on file   Years of education: Not on file   Highest education level: Not on file  Occupational History   Occupation: Customer service/ Counsellor  Tobacco Use   Smoking status: Former    Current packs/day: 0.00    Average packs/day: 2.0 packs/day for 4.0 years (8.0 ttl pk-yrs)    Types: Cigarettes    Start date: 03/31/2000    Quit date: 03/31/2004    Years since quitting: 19.4   Smokeless  tobacco: Never  Vaping Use   Vaping status: Never Used  Substance and Sexual Activity   Alcohol use: No   Drug use: Not Currently    Types: "Crack" cocaine, Marijuana    Comment: quit 17 years ago   Sexual activity: Yes  Other Topics Concern   Not on file  Social History Narrative   Lives with husband and her 2 children   Right handed   Caffeine: 32 oz per day zero sugar soda or tea   Social Drivers of Corporate investment banker Strain: Not on file  Food Insecurity: No Food Insecurity (07/30/2022)   Hunger Vital Sign    Worried About Running Out of Food in the Last Year: Never true    Ran Out of Food in the Last Year: Never true  Transportation Needs: No Transportation Needs (07/30/2022)   PRAPARE - Administrator, Civil Service (Medical): No     Lack of Transportation (Non-Medical): No  Physical Activity: Not on file  Stress: Not on file  Social Connections: Unknown (08/02/2021)   Received from [facility], [facility]   Social Network    Social Network: Not on file  Intimate Partner Violence: Not At Risk (07/28/2022)   Humiliation, Afraid, Rape, and Kick questionnaire    Fear of Current or Ex-Partner: No    Emotionally Abused: No    Physically Abused: No    Sexually Abused: No   Family History  Problem Relation Age of Onset   Diabetes Mother    Hypertension Mother    Hyperlipidemia Mother    Depression Mother    Anxiety disorder Mother    Anxiety disorder Father    Depression Father    Hyperlipidemia Father    Thyroid  disease Father    Sleep apnea Father    Sleep apnea Maternal Aunt    Sleep apnea Paternal Aunt    Colon cancer Paternal Grandmother     Objective: Office vital signs reviewed. There were no vitals taken for this visit.  Physical Examination:  General: Awake, alert, *** nourished, No acute distress HEENT: Normal    Neck: No masses palpated. No lymphadenopathy    Ears: Tympanic membranes intact, normal light reflex, no erythema, no bulging    Eyes: PERRLA, extraocular membranes intact, sclera ***    Nose: nasal turbinates moist, *** nasal discharge    Throat: moist mucus membranes, no erythema, *** tonsillar exudate.  Airway is patent Cardio: regular rate and rhythm, S1S2 heard, no murmurs appreciated Pulm: clear to auscultation bilaterally, no wheezes, rhonchi or rales; normal work of breathing on room air GI: soft, non-tender, non-distended, bowel sounds present x4, no hepatomegaly, no splenomegaly, no masses GU: external vaginal tissue ***, cervix ***, *** punctate lesions on cervix appreciated, *** discharge from cervical os, *** bleeding, *** cervical motion tenderness, *** abdominal/ adnexal masses Extremities: warm, well perfused, No edema, cyanosis or clubbing; +*** pulses  bilaterally MSK: *** gait and *** station Skin: dry; intact; no rashes or lesions Neuro: *** Strength and light touch sensation grossly intact, *** DTRs ***/4  Assessment/ Plan: 38 y.o. female   Essential hypertension  Hashimoto's thyroiditis  ***   Bryce Cheever Bambi Bonine, DO [facility] [phone]

## 2023-09-14 ENCOUNTER — Other Ambulatory Visit (HOSPITAL_COMMUNITY): Payer: Self-pay

## 2023-09-14 ENCOUNTER — Other Ambulatory Visit: Payer: Self-pay

## 2023-09-14 ENCOUNTER — Encounter: Payer: Self-pay | Admitting: Family Medicine

## 2023-09-14 DIAGNOSIS — F410 Panic disorder [episodic paroxysmal anxiety] without agoraphobia: Secondary | ICD-10-CM

## 2023-09-14 DIAGNOSIS — F32A Depression, unspecified: Secondary | ICD-10-CM

## 2023-09-14 MED ORDER — CITALOPRAM HYDROBROMIDE 40 MG PO TABS
40.0000 mg | ORAL_TABLET | Freq: Every day | ORAL | 3 refills | Status: DC
  Filled 2023-09-14: qty 90, 90d supply, fill #0

## 2023-09-14 MED ORDER — BUSPIRONE HCL 15 MG PO TABS
15.0000 mg | ORAL_TABLET | Freq: Two times a day (BID) | ORAL | 3 refills | Status: DC
  Filled 2023-09-14: qty 180, 90d supply, fill #0

## 2023-09-24 ENCOUNTER — Other Ambulatory Visit (HOSPITAL_COMMUNITY): Payer: Self-pay

## 2023-10-08 MED ORDER — CITALOPRAM HYDROBROMIDE 40 MG PO TABS: 40.0000 mg | ORAL_TABLET | Freq: Every day | ORAL | 3 refills | Status: DC

## 2023-10-08 MED ORDER — BUSPIRONE HCL 15 MG PO TABS: 15.0000 mg | ORAL_TABLET | Freq: Two times a day (BID) | ORAL | 3 refills | Status: DC

## 2023-10-20 DIAGNOSIS — Z01419 Encounter for gynecological examination (general) (routine) without abnormal findings: Secondary | ICD-10-CM | POA: Diagnosis not present

## 2023-10-30 ENCOUNTER — Encounter: Payer: Self-pay | Admitting: Family Medicine

## 2023-10-30 ENCOUNTER — Ambulatory Visit: Admitting: Family Medicine

## 2023-10-30 VITALS — BP 136/84 | HR 67 | Temp 98.0°F | Ht 69.0 in | Wt 309.8 lb

## 2023-10-30 DIAGNOSIS — R21 Rash and other nonspecific skin eruption: Secondary | ICD-10-CM | POA: Diagnosis not present

## 2023-10-30 MED ORDER — TRIAMCINOLONE ACETONIDE 0.1 % EX CREA
1.0000 | TOPICAL_CREAM | Freq: Two times a day (BID) | CUTANEOUS | 0 refills | Status: AC
Start: 2023-10-30 — End: 2023-11-09

## 2023-10-30 NOTE — Progress Notes (Signed)
   Acute Office Visit  Subjective:     Patient ID: Angela Johnson, female    DOB: 08-25-1985, 38 y.o.   MRN: [id number]  Chief Complaint  Patient presents with   Rash    Rash This is a new problem. The current episode started in the past 7 days. The problem is unchanged. The rash is diffuse. The rash is characterized by itchiness. She was exposed to nothing. Pertinent negatives include no congestion, cough, diarrhea, facial edema, fever, rhinorrhea, shortness of breath or sore throat. Past treatments include nothing.  Small red bumps that are itchy. No exudate. Some with small white heads that she has scratched off. Scattered on trunk, extremities.   Review of Systems  Constitutional:  Negative for fever.  HENT:  Negative for congestion, rhinorrhea and sore throat.   Respiratory:  Negative for cough and shortness of breath.   Gastrointestinal:  Negative for diarrhea.  Skin:  Positive for rash.        Objective:    BP 136/84   Pulse 67   Temp 98 F (36.7 C) (Temporal)   Ht 5' 9 (1.753 m)   Wt (!) 309 lb 12.8 oz (140.5 kg)   SpO2 97%   BMI 45.75 kg/m    Physical Exam Vitals and nursing note reviewed.  Constitutional:      General: She is not in acute distress.    Appearance: She is not ill-appearing, toxic-appearing or diaphoretic.  Musculoskeletal:     Right lower leg: No edema.     Left lower leg: No edema.  Skin:    General: Skin is warm and dry.     Findings: Rash present. Rash is papular (scattered light pink papules to trunk, extremities bilateral. No exudate or surrounding erythema.).  Neurological:     General: No focal deficit present.     Mental Status: She is alert and oriented to person, place, and time.  Psychiatric:        Mood and Affect: Mood normal.        Behavior: Behavior normal.     No results found for any visits on 10/30/23.      Assessment & Plan:   Angela Johnson was seen today for rash.  Diagnoses and all orders for this  visit:  Rash ? Papular eczema vs folliculitis? Kenalog BID as below. No not apply to face or genitals. Return to office for new or worsening symptoms, or if symptoms persist.  -     triamcinolone cream (KENALOG) 0.1 %; Apply 1 Application topically 2 (two) times daily for 10 days.  The patient indicates understanding of these issues and agrees with the plan.  Angela CHRISTELLA Search, FNP

## 2023-11-21 ENCOUNTER — Other Ambulatory Visit: Payer: Self-pay | Admitting: Family Medicine

## 2023-11-21 DIAGNOSIS — I10 Essential (primary) hypertension: Secondary | ICD-10-CM

## 2023-12-27 ENCOUNTER — Other Ambulatory Visit: Payer: Self-pay | Admitting: Family Medicine

## 2023-12-27 DIAGNOSIS — I10 Essential (primary) hypertension: Secondary | ICD-10-CM

## 2023-12-28 ENCOUNTER — Encounter: Payer: Self-pay | Admitting: Family Medicine

## 2023-12-28 NOTE — Telephone Encounter (Signed)
Left message to schedule appt and will mail letter

## 2023-12-28 NOTE — Telephone Encounter (Signed)
 [place] pt NTBS 30-d given 11/23/23

## 2023-12-31 ENCOUNTER — Other Ambulatory Visit: Payer: Self-pay | Admitting: Family Medicine

## 2023-12-31 DIAGNOSIS — I10 Essential (primary) hypertension: Secondary | ICD-10-CM

## 2024-01-13 ENCOUNTER — Telehealth: Payer: Self-pay | Admitting: Family Medicine

## 2024-01-13 ENCOUNTER — Encounter: Payer: Self-pay | Admitting: Family Medicine

## 2024-01-13 ENCOUNTER — Ambulatory Visit: Admitting: Family Medicine

## 2024-01-13 VITALS — BP 120/78 | HR 65 | Temp 96.9°F | Ht 69.0 in | Wt 313.0 lb

## 2024-01-13 DIAGNOSIS — E063 Autoimmune thyroiditis: Secondary | ICD-10-CM | POA: Diagnosis not present

## 2024-01-13 DIAGNOSIS — E559 Vitamin D deficiency, unspecified: Secondary | ICD-10-CM

## 2024-01-13 DIAGNOSIS — I10 Essential (primary) hypertension: Secondary | ICD-10-CM | POA: Diagnosis not present

## 2024-01-13 DIAGNOSIS — J029 Acute pharyngitis, unspecified: Secondary | ICD-10-CM | POA: Diagnosis not present

## 2024-01-13 DIAGNOSIS — E781 Pure hyperglyceridemia: Secondary | ICD-10-CM

## 2024-01-13 DIAGNOSIS — E538 Deficiency of other specified B group vitamins: Secondary | ICD-10-CM

## 2024-01-13 LAB — RAPID STREP SCREEN (MED CTR MEBANE ONLY): Strep Gp A Ag, IA W/Reflex: NEGATIVE

## 2024-01-13 LAB — VERITOR FLU A/B WAIVED
Influenza A: NEGATIVE
Influenza B: NEGATIVE

## 2024-01-13 LAB — CULTURE, GROUP A STREP

## 2024-01-13 MED ORDER — AMLODIPINE BESYLATE 5 MG PO TABS: 5.0000 mg | ORAL_TABLET | Freq: Every day | ORAL | 4 refills | Status: DC

## 2024-01-13 NOTE — Progress Notes (Signed)
 Subjective: CC: Follow-up hypertension PCP: Jolinda Angela HERO, DO YEP:Angela Johnson is a 38 y.o. female presenting to clinic today for:  Hypertension She is compliant with Norvasc  5 mg daily.  No chest pain, shortness of breath.  She continues to lose weight after her gastric bypass surgery.  She notes she still has some intermittent headaches and has had a history of mild obstructive sleep apnea but never needed any CPAP machine.  She admits that she does not hydrate adequately and less she is drinking her vibe drinks and then later sips on water.  Sore throat She reports that she has a mild sore throat that started last night.  She reports and postnasal drainage.  No fevers, chills, cough or wheezing reported.  There have been other people's that she traveled with recently that have been ill as well.  Has not self tested for COVID or flu but would like to have that done today   ROS: Per HPI  Allergies  Allergen Reactions   Ciprofloxacin  Hcl Itching   Doxycycline  Other (See Comments)    Was told not to take after gastric bypass   Latex Itching    Power   Zithromax  [Azithromycin ]     dizziness   Past Medical History:  Diagnosis Date   Alcohol abuse    history   Anxiety    Arthritis    Back pain    Depression    Drug abuse (HCC)    history   GERD (gastroesophageal reflux disease)    Gonorrhea 03/31/2005   Treated   Hypothyroidism    IBS (irritable bowel syndrome)    Other hyperlipidemia    Palpitations    Pre-diabetes    SOB (shortness of breath)    due to weight   Vitamin D  deficiency     Current Outpatient Medications:    albuterol  (VENTOLIN  HFA) 108 (90 Base) MCG/ACT inhaler, Inhale 2 puffs into the lungs every 6 (six) hours as needed for wheezing or shortness of breath., Disp: 18 g, Rfl: 0   amLODipine  (NORVASC ) 5 MG tablet, Take 1 tablet (5 mg total) by mouth daily. For hypertension, Disp: 90 tablet, Rfl: 4   busPIRone  (BUSPAR ) 15 MG tablet, Take 1 tablet  (15 mg total) by mouth 2 (two) times daily., Disp: 180 tablet, Rfl: 3   CALCIUM PO, Take 500 mg by mouth in the morning, at noon, and at bedtime., Disp: , Rfl:    cetirizine (ZYRTEC) 10 MG tablet, Take 10 mg by mouth daily. allergies , Disp: , Rfl:    citalopram  (CELEXA ) 40 MG tablet, Take 1 tablet (40 mg total) by mouth daily., Disp: 90 tablet, Rfl: 3   fluticasone  (FLONASE ) 50 MCG/ACT nasal spray, Place 2 sprays into both nostrils daily., Disp: 16 g, Rfl: 6   levothyroxine  (SYNTHROID ) 25 MCG tablet, Take 1 tablet (25 mcg total) by mouth daily., Disp: 90 tablet, Rfl: 3   Multiple Vitamin (MULTI-VITAMIN) tablet, Take 1 tablet by mouth daily., Disp: , Rfl:    norgestrel-ethinyl estradiol (LO/OVRAL,CRYSELLE) 0.3-30 MG-MCG tablet, Take 1 tablet by mouth daily. (Patient not taking: Reported on 10/30/2023), Disp: , Rfl:    ondansetron  (ZOFRAN -ODT) 4 MG disintegrating tablet, Take 1 tablet (4 mg total) by mouth every 6 (six) hours as needed for nausea or vomiting., Disp: 20 tablet, Rfl: 0   Vitamin D , Ergocalciferol , (DRISDOL ) 1.25 MG (50000 UNIT) CAPS capsule, Take 1 capsule (50,000 Units total) by mouth every 7 (seven) days., Disp: 12 capsule, Rfl: 3  Social History   Socioeconomic History   Marital status: Married    Spouse name: Not on file   Number of children: Not on file   Years of education: Not on file   Highest education level: Some college, no degree  Occupational History   Occupation: Chief Strategy Officer  Tobacco Use   Smoking status: Former    Current packs/day: 0.00    Average packs/day: 2.0 packs/day for 4.0 years (8.0 ttl pk-yrs)    Types: Cigarettes    Start date: 03/31/2000    Quit date: 03/31/2004    Years since quitting: 19.8   Smokeless tobacco: Never  Vaping Use   Vaping status: Never Used  Substance and Sexual Activity   Alcohol use: No   Drug use: Not Currently    Types: Crack cocaine, Marijuana    Comment: quit 17 years ago   Sexual activity: Yes   Other Topics Concern   Not on file  Social History Narrative   Lives with husband and her 2 children   Right handed   Caffeine: 32 oz per day zero sugar soda or tea   Social Drivers of Corporate investment banker Strain: Low Risk  (01/12/2024)   Overall Financial Resource Strain (CARDIA)    Difficulty of Paying Living Expenses: Not very hard  Food Insecurity: No Food Insecurity (01/12/2024)   Hunger Vital Sign    Worried About Running Out of Food in the Last Year: Never true    Ran Out of Food in the Last Year: Never true  Transportation Needs: No Transportation Needs (01/12/2024)   PRAPARE - Administrator, Civil Service (Medical): No    Lack of Transportation (Non-Medical): No  Physical Activity: Sufficiently Active (01/12/2024)   Exercise Vital Sign    Days of Exercise per Week: 5 days    Minutes of Exercise per Session: 30 min  Stress: No Stress Concern Present (01/12/2024)   [employer] of Occupational Health - Occupational Stress Questionnaire    Feeling of Stress: Only a little  Social Connections: Socially Integrated (01/12/2024)   Social Connection and Isolation Panel    Frequency of Communication with Friends and Family: More than three times a week    Frequency of Social Gatherings with Friends and Family: More than three times a week    Attends Religious Services: More than 4 times per year    Active Member of [employer] or Organizations: Yes    Attends Engineer, structural: More than 4 times per year    Marital Status: Married  Catering manager Violence: Not At Risk (07/28/2022)   Humiliation, Afraid, Rape, and Kick questionnaire    Fear of Current or Ex-Partner: No    Emotionally Abused: No    Physically Abused: No    Sexually Abused: No   Family History  Problem Relation Age of Onset   Diabetes Mother    Hypertension Mother    Hyperlipidemia Mother    Depression Mother    Anxiety disorder Mother    Anxiety disorder Father     Depression Father    Hyperlipidemia Father    Thyroid  disease Father    Sleep apnea Father    Sleep apnea Maternal Aunt    Sleep apnea Paternal Aunt    Colon cancer Paternal Grandmother     Objective: Office vital signs reviewed. BP 120/78   Pulse 65   Temp (!) 96.9 F (36.1 C)   Ht 5' 9 (1.753 m)  Wt (!) 313 lb (142 kg)   SpO2 97%   BMI 46.22 kg/m   Physical Examination:  General: Awake, alert, well-appearing obese female, No acute distress HEENT: Normal    Neck: No masses palpated. No lymphadenopathy    Ears: Tympanic membranes intact, normal light reflex, no erythema, no bulging    Eyes: PERRLA, extraocular membranes intact, sclera white    Nose: nasal turbinates moist, clear nasal discharge    Throat: moist mucus membranes, mild oropharyngeal erythema, no tonsillar exudate.  Airway is patent Cardio: regular rate and rhythm, S1S2 heard, no murmurs appreciated Pulm: clear to auscultation bilaterally, no wheezes, rhonchi or rales; normal work of breathing on room air Neuro: No focal neurologic deficits  Assessment/ Plan: 38 y.o. female   Sore throat - Plan: Rapid Strep Screen ([facility] ONLY), [facility]itor Flu A/B Waived, Novel Coronavirus, NAA ([facility])  Essential hypertension - Plan: CMP14+EGFR, amLODipine  (NORVASC ) 5 MG tablet  Hashimoto's thyroiditis - Plan: TSH + free T4, CMP14+EGFR  Vitamin D  deficiency - Plan: VITAMIN D  25 Hydroxy (Vit-D Deficiency, Fractures), CMP14+EGFR  Hypertriglyceridemia - Plan: CMP14+EGFR, Lipid panel  Vitamin B12 deficiency - Plan: CMP14+EGFR, CBC, Vitamin B12   Rapid flu and strep negative.  Sent for COVID.  Physical exam notable for mild oropharyngeal erythema so I suspect this is likely viral.  Advised to delay flu vaccine today  Blood pressure under good control.  I wonder if the headaches are related to inadequate hydration with water.  I have placed future orders for fasting labs in anticipation of her physical exam that is  due in about 4 months.   Angela CHRISTELLA Fielding, DO [facility] [phone]

## 2024-01-13 NOTE — Telephone Encounter (Signed)
 Pt wanted an appt for physical in 3 months. Dr KANDICE. Didn't have anything until April. I scheduled appt but pt is worried she will not get her meds filled in 3 months.

## 2024-01-14 LAB — NOVEL CORONAVIRUS, NAA: SARS-CoV-2, NAA: NOT DETECTED

## 2024-01-15 ENCOUNTER — Ambulatory Visit: Payer: Self-pay | Admitting: Family Medicine

## 2024-01-15 DIAGNOSIS — E559 Vitamin D deficiency, unspecified: Secondary | ICD-10-CM

## 2024-01-15 DIAGNOSIS — E063 Autoimmune thyroiditis: Secondary | ICD-10-CM

## 2024-03-08 ENCOUNTER — Encounter (HOSPITAL_COMMUNITY): Payer: Self-pay | Admitting: *Deleted

## 2024-03-14 ENCOUNTER — Encounter: Payer: Self-pay | Admitting: Family Medicine

## 2024-03-30 ENCOUNTER — Telehealth: Admitting: Nurse Practitioner

## 2024-03-30 ENCOUNTER — Encounter: Payer: Self-pay | Admitting: Nurse Practitioner

## 2024-03-30 DIAGNOSIS — J111 Influenza due to unidentified influenza virus with other respiratory manifestations: Secondary | ICD-10-CM | POA: Diagnosis not present

## 2024-03-30 MED ORDER — OSELTAMIVIR PHOSPHATE 75 MG PO CAPS: 75.0000 mg | ORAL_CAPSULE | Freq: Two times a day (BID) | ORAL | 0 refills | Status: DC

## 2024-03-30 NOTE — Patient Instructions (Signed)

## 2024-03-30 NOTE — Progress Notes (Signed)
 "   Virtual Visit Consent   Angela Johnson, you are scheduled for a virtual visit with Angela Gladis, FNP, a [facility] provider, today.     Just as with appointments in the office, your consent must be obtained to participate.  Your consent will be active for this visit and any virtual visit you may have with one of our providers in the next 365 days.     If you have a MyChart account, a copy of this consent can be sent to you electronically.  All virtual visits are billed to your insurance company just like a traditional visit in the office.    As this is a virtual visit, video technology does not allow for your provider to perform a traditional examination.  This may limit your provider's ability to fully assess your condition.  If your provider identifies any concerns that need to be evaluated in person or the need to arrange testing (such as labs, EKG, etc.), we will make arrangements to do so.     Although advances in technology are sophisticated, we cannot ensure that it will always work on either your end or our end.  If the connection with a video visit is poor, the visit may have to be switched to a telephone visit.  With either a video or telephone visit, we are not always able to ensure that we have a secure connection.     I need to obtain your verbal consent now.   Are you willing to proceed with your visit today? YES   Joell L Willets has provided verbal consent on 03/30/2024 for a virtual visit (video or telephone).   Angela Gladis, FNP   Date: 03/30/2024 9:58 AM   Virtual Visit via Video Note   I, Angela Ayomide Zuleta, connected with Shaine Mount Grassel ([id number], Dec 30, 1985) on 03/30/2024 at 10:00 AM EST by a video-enabled telemedicine application and verified that I am speaking with the correct person using two identifiers.  Location: Patient: Virtual Visit Location Patient: Home Provider: Virtual Visit Location Provider: Mobile   I discussed the  limitations of evaluation and management by telemedicine and the availability of in person appointments. The patient expressed understanding and agreed to proceed.    History of Present Illness: Angela Johnson is a 38 y.o. who identifies as a female who was assigned female at birth, and is being seen today for uri .  HPI: URI  This is a new problem. The current episode started yesterday. The problem has been waxing and waning. The maximum temperature recorded prior to her arrival was 100.4 - 100.9 F. Associated symptoms include congestion, coughing, headaches and rhinorrhea. She has tried acetaminophen  for the symptoms. The treatment provided mild relief.    Review of Systems  HENT:  Positive for congestion and rhinorrhea.   Respiratory:  Positive for cough.   Neurological:  Positive for headaches.    Problems:  Patient Active Problem List   Diagnosis Date Noted   S/P gastric bypass 07/28/2022   OSA (obstructive sleep apnea) 11/12/2021   Lumbar radiculopathy 11/12/2021   Primary hypertension 08/02/2021   Hashimoto's thyroiditis 11/17/2019   Metabolic syndrome 11/15/2019   Elevated blood sugar 03/17/2019   Restless legs 09/21/2018   Hypersomnolence 06/16/2017   BMI 60.0-69.9, adult (HCC) 06/15/2017   Generalized anxiety disorder with panic attacks 10/09/2015   Elevated blood pressure reading 10/09/2015    Allergies: Allergies[1] Medications: Current Medications[2]  Observations/Objective: Patient is well-developed, well-nourished in no acute distress.  Resting comfortably  at home.  Head is normocephalic, atraumatic.  No labored breathing.  Speech is clear and coherent with logical content.  Patient is alert and oriented at baseline.  Face flushed Chills Deep cough Scratchy throat  Assessment and Plan:  Tiona L Ransford in today with chief complaint of No chief complaint on file.   1. Influenza (Primary) 1. Take meds as prescribed 2. Use a cool mist humidifier  especially during the winter months and when heat has been humid. 3. Use saline nose sprays frequently 4. Saline irrigations of the nose can be very helpful if done frequently.  * 4X daily for 1 week*  * Use of a nettie pot can be helpful with this. Follow directions with this* 5. Drink plenty of fluids 6. Keep thermostat turn down low 7.For any cough or congestion- mucinex or delsym 8. For fever or aces or pains- take tylenol  or ibuprofen  appropriate for age and weight.  * for fevers greater than 101 orally you may alternate ibuprofen  and tylenol  every  3 hours.    Meds ordered this encounter  Medications   oseltamivir  (TAMIFLU ) 75 MG capsule    Sig: Take 1 capsule (75 mg total) by mouth 2 (two) times daily.    Dispense:  10 capsule    Refill:  0    Supervising Provider:   MARYANNE CHEW A [[id number]]      Follow Up Instructions: I discussed the assessment and treatment plan with the patient. The patient was provided an opportunity to ask questions and all were answered. The patient agreed with the plan and demonstrated an understanding of the instructions.  A copy of instructions were sent to the patient via MyChart.  The patient was advised to call back or seek an in-person evaluation if the symptoms worsen or if the condition fails to improve as anticipated.  Time:  I spent 10 minutes with the patient via telehealth technology discussing the above problems/concerns.    Angela Gladis, FNP     [1]  Allergies Allergen Reactions   Ciprofloxacin  Hcl Itching   Doxycycline  Other (See Comments)    Was told not to take after gastric bypass   Latex Itching    Power   Zithromax  [Azithromycin ]     dizziness  [2]  Current Outpatient Medications:    albuterol  (VENTOLIN  HFA) 108 (90 Base) MCG/ACT inhaler, Inhale 2 puffs into the lungs every 6 (six) hours as needed for wheezing or shortness of breath., Disp: 18 g, Rfl: 0   amLODipine  (NORVASC ) 5 MG tablet, Take 1 tablet (5  mg total) by mouth daily. For hypertension, Disp: 90 tablet, Rfl: 4   busPIRone  (BUSPAR ) 15 MG tablet, Take 1 tablet (15 mg total) by mouth 2 (two) times daily., Disp: 180 tablet, Rfl: 3   CALCIUM PO, Take 500 mg by mouth in the morning, at noon, and at bedtime., Disp: , Rfl:    cetirizine (ZYRTEC) 10 MG tablet, Take 10 mg by mouth daily. allergies , Disp: , Rfl:    citalopram  (CELEXA ) 40 MG tablet, Take 1 tablet (40 mg total) by mouth daily., Disp: 90 tablet, Rfl: 3   fluticasone  (FLONASE ) 50 MCG/ACT nasal spray, Place 2 sprays into both nostrils daily., Disp: 16 g, Rfl: 6   levothyroxine  (SYNTHROID ) 25 MCG tablet, Take 1 tablet (25 mcg total) by mouth daily., Disp: 90 tablet, Rfl: 3   Multiple Vitamin (MULTI-VITAMIN) tablet, Take 1 tablet by mouth daily., Disp: , Rfl:    norgestrel-ethinyl estradiol (  LO/OVRAL,CRYSELLE) 0.3-30 MG-MCG tablet, Take 1 tablet by mouth daily. (Patient not taking: Reported on 10/30/2023), Disp: , Rfl:    ondansetron  (ZOFRAN -ODT) 4 MG disintegrating tablet, Take 1 tablet (4 mg total) by mouth every 6 (six) hours as needed for nausea or vomiting., Disp: 20 tablet, Rfl: 0   Vitamin D , Ergocalciferol , (DRISDOL ) 1.25 MG (50000 UNIT) CAPS capsule, Take 1 capsule (50,000 Units total) by mouth every 7 (seven) days., Disp: 12 capsule, Rfl: 3  "

## 2024-03-31 ENCOUNTER — Other Ambulatory Visit: Payer: Self-pay | Admitting: Family Medicine

## 2024-03-31 DIAGNOSIS — E063 Autoimmune thyroiditis: Secondary | ICD-10-CM

## 2024-04-15 ENCOUNTER — Ambulatory Visit: Payer: Self-pay | Admitting: Family Medicine

## 2024-04-15 ENCOUNTER — Encounter: Payer: Self-pay | Admitting: Family Medicine

## 2024-04-15 VITALS — BP 136/80 | HR 68 | Temp 97.9°F | Ht 70.0 in | Wt 311.5 lb

## 2024-04-15 DIAGNOSIS — Z0000 Encounter for general adult medical examination without abnormal findings: Secondary | ICD-10-CM

## 2024-04-15 DIAGNOSIS — I10 Essential (primary) hypertension: Secondary | ICD-10-CM | POA: Diagnosis not present

## 2024-04-15 DIAGNOSIS — E538 Deficiency of other specified B group vitamins: Secondary | ICD-10-CM | POA: Diagnosis not present

## 2024-04-15 DIAGNOSIS — Z0001 Encounter for general adult medical examination with abnormal findings: Secondary | ICD-10-CM | POA: Diagnosis not present

## 2024-04-15 DIAGNOSIS — F32A Depression, unspecified: Secondary | ICD-10-CM | POA: Diagnosis not present

## 2024-04-15 DIAGNOSIS — Z9884 Bariatric surgery status: Secondary | ICD-10-CM

## 2024-04-15 DIAGNOSIS — E781 Pure hyperglyceridemia: Secondary | ICD-10-CM

## 2024-04-15 DIAGNOSIS — E063 Autoimmune thyroiditis: Secondary | ICD-10-CM

## 2024-04-15 DIAGNOSIS — G9331 Postviral fatigue syndrome: Secondary | ICD-10-CM

## 2024-04-15 DIAGNOSIS — F411 Generalized anxiety disorder: Secondary | ICD-10-CM | POA: Diagnosis not present

## 2024-04-15 DIAGNOSIS — F410 Panic disorder [episodic paroxysmal anxiety] without agoraphobia: Secondary | ICD-10-CM

## 2024-04-15 DIAGNOSIS — Z1159 Encounter for screening for other viral diseases: Secondary | ICD-10-CM

## 2024-04-15 DIAGNOSIS — E559 Vitamin D deficiency, unspecified: Secondary | ICD-10-CM | POA: Diagnosis not present

## 2024-04-15 DIAGNOSIS — Z111 Encounter for screening for respiratory tuberculosis: Secondary | ICD-10-CM

## 2024-04-15 DIAGNOSIS — Z789 Other specified health status: Secondary | ICD-10-CM

## 2024-04-15 LAB — LIPID PANEL

## 2024-04-15 MED ORDER — METHYLPREDNISOLONE ACETATE 80 MG/ML IJ SUSP
40.0000 mg | Freq: Once | INTRAMUSCULAR | Status: AC
  Administered 2024-04-15: 40 mg via INTRAMUSCULAR

## 2024-04-15 MED ORDER — AMLODIPINE BESYLATE 5 MG PO TABS: 5.0000 mg | ORAL_TABLET | Freq: Every day | ORAL | 4 refills | Status: AC

## 2024-04-15 MED ORDER — CITALOPRAM HYDROBROMIDE 40 MG PO TABS: 40.0000 mg | ORAL_TABLET | Freq: Every day | ORAL | 3 refills | Status: AC

## 2024-04-15 MED ORDER — BUSPIRONE HCL 15 MG PO TABS: 15.0000 mg | ORAL_TABLET | Freq: Two times a day (BID) | ORAL | 3 refills | Status: AC

## 2024-04-15 MED ORDER — LEVOTHYROXINE SODIUM 25 MCG PO TABS: 25.0000 ug | ORAL_TABLET | Freq: Every day | ORAL | 3 refills | Status: DC

## 2024-04-15 MED ORDER — VITAMIN D (ERGOCALCIFEROL) 1.25 MG (50000 UNIT) PO CAPS: 50000.0000 [IU] | ORAL_CAPSULE | ORAL | 3 refills | Status: AC

## 2024-04-15 NOTE — Progress Notes (Signed)
 "  Angela Johnson is a 39 y.o. female presents to office today for annual physical exam examination.    Patient reports that she has been doing well overall.  She has had a little bit of residual cough and vocal cord irritation since she had influenza after Christmas.  She denies any other symptoms including rhinorrhea, shortness of breath, wheezing, myalgia etc.  She continues to do well after her gastric bypass.  She actually was finally called for a follow-up visit with the surgeon and will be seeing them soon.  Compliant with all vitamin supplementation.  Did not continue exercise regimen over holidays and gained a little weight but she anticipates she will be starting this back soon.  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  Occupation: Museum/gallery curator, Marital status: Married, Substance use: None Health Maintenance Due  Topic Date Due   Hepatitis C Screening  Never done    Immunization History  Administered Date(s) Administered   Hepatitis B, PED/ADOLESCENT 01/03/1997, 02/03/1997, 06/09/1997   Influenza, Seasonal, Injecte, Preservative Fre 05/29/2023   Influenza,inj,Quad PF,6+ Mos 02/22/2018, 03/14/2019   Td 10/21/2021   Tdap 08/28/2011   Past Medical History:  Diagnosis Date   Alcohol abuse    history   Anxiety    Arthritis    Back pain    Depression    Drug abuse (HCC)    history   GERD (gastroesophageal reflux disease)    Gonorrhea 03/31/2005   Treated   Hypothyroidism    IBS (irritable bowel syndrome)    Other hyperlipidemia    Palpitations    Pre-diabetes    SOB (shortness of breath)    due to weight   Vitamin D  deficiency    Social History   Socioeconomic History   Marital status: Married    Spouse name: Not on file   Number of children: Not on file   Years of education: Not on file   Highest education level: Some college, no degree  Occupational History   Occupation: Nurse, Learning Disability  Tobacco Use   Smoking status: Former    Current packs/day: 0.00    Average packs/day: 2.0 packs/day for 4.0 years (8.0 ttl pk-yrs)    Types: Cigarettes    Start date: 03/31/2000    Quit date: 03/31/2004    Years since quitting: 20.0   Smokeless tobacco: Never  Vaping Use   Vaping status: Never Used  Substance and Sexual Activity   Alcohol use: No   Drug use: Not Currently    Types: Crack cocaine, Marijuana    Comment: quit 17 years ago   Sexual activity: Yes  Other Topics Concern   Not on file  Social History Narrative   Lives with husband and her 2 children   Right handed   Caffeine: 32 oz per day zero sugar soda or tea   Social Drivers of Health   Tobacco Use: Medium Risk (04/15/2024)   Patient History    Smoking Tobacco Use: Former    Smokeless Tobacco Use: Never    Passive Exposure: Not on Actuary Strain: Low Risk (01/12/2024)   Overall Financial Resource Strain (CARDIA)    Difficulty of Paying Living Expenses: Not very hard  Food Insecurity: No Food Insecurity (01/12/2024)   Epic    Worried About Radiation Protection Practitioner of Food in the Last Year: Never true    Ran Out of Food in the Last  Year: Never true  Transportation Needs: No Transportation Needs (01/12/2024)   Epic    Lack of Transportation (Medical): No    Lack of Transportation (Non-Medical): No  Physical Activity: Sufficiently Active (01/12/2024)   Exercise Vital Sign    Days of Exercise per Week: 5 days    Minutes of Exercise per Session: 30 min  Stress: No Stress Concern Present (01/12/2024)   [employer] of Occupational Health - Occupational Stress Questionnaire    Feeling of Stress: Only a little  Social Connections: Socially Integrated (01/12/2024)   Social Connection and Isolation Panel    Frequency of Communication with Friends and Family: More than three times a week    Frequency of Social Gatherings with Friends and Family: More than three times a week    Attends Religious  Services: More than 4 times per year    Active Member of [employer] or Organizations: Yes    Attends Engineer, Structural: More than 4 times per year    Marital Status: Married  Catering Manager Violence: Not At Risk (07/28/2022)   Humiliation, Afraid, Rape, and Kick questionnaire    Fear of Current or Ex-Partner: No    Emotionally Abused: No    Physically Abused: No    Sexually Abused: No  Depression (PHQ2-9): Low Risk (01/13/2024)   Depression (PHQ2-9)    PHQ-2 Score: 0  Alcohol Screen: Not on file  Housing: Low Risk (01/12/2024)   Epic    Unable to Pay for Housing in the Last Year: No    Number of Times Moved in the Last Year: 1    Homeless in the Last Year: No  Utilities: At Risk (07/28/2022)   [facility] Utilities    Threatened with loss of utilities: Yes  Health Literacy: Not on file   Past Surgical History:  Procedure Laterality Date   GASTRIC ROUX-EN-Y N/A 07/28/2022   Procedure: LAPAROSCOPIC ROUX-EN-Y GASTRIC BYPASS WITH UPPER ENDOSCOPY;  Surgeon: Tanda Locus, MD;  Location: [facility];  Service: General;  Laterality: N/A;   oral     wisdom teeth   Family History  Problem Relation Age of Onset   Diabetes Mother    Hypertension Mother    Hyperlipidemia Mother    Depression Mother    Anxiety disorder Mother    Anxiety disorder Father    Depression Father    Hyperlipidemia Father    Thyroid  disease Father    Sleep apnea Father    Sleep apnea Maternal Aunt    Sleep apnea Paternal Aunt    Colon cancer Paternal Grandmother    Current Medications[1]  Allergies[2]   ROS: Review of Systems Pertinent items noted in HPI and remainder of comprehensive ROS otherwise negative.    Physical exam BP 136/80   Pulse 68   Temp 97.9 F (36.6 C)   Ht 5' 10 (1.778 m)   Wt (!) 311 lb 8 oz (141.3 kg)   SpO2 95%   BMI 44.70 kg/m  General appearance: alert, cooperative, appears stated age, no distress, and morbidly obese Head: Normocephalic, without obvious abnormality,  atraumatic Eyes: negative findings: lids and lashes normal and conjunctivae and sclerae normal Ears: normal TM's and external ear canals both ears Nose: Nares normal. Septum midline. Mucosa normal. No drainage or sinus tenderness. Throat: lips, mucosa, and tongue normal; teeth and gums normal Neck: no adenopathy, supple, symmetrical, trachea midline, and thyroid  not enlarged, symmetric, no tenderness/mass/nodules Back: symmetric, no curvature. ROM normal. No CVA tenderness. Lungs: clear to auscultation bilaterally  Heart: regular rate and rhythm, S1, S2 normal, no murmur, click, rub or gallop Abdomen: soft, non-tender; bowel sounds normal; no masses,  no organomegaly Extremities: extremities normal, atraumatic, no cyanosis or edema Pulses: 2+ and symmetric Skin: Skin color, texture, turgor normal. No rashes or lesions Lymph nodes: No supraclavicular or anterior cervical lymph node enlargement Neurologic: Slight hoarseness to her voice but otherwise no focal neurologic deficits     01/13/2024    1:23 PM 10/30/2023   11:20 AM 05/29/2023    9:47 AM  Depression screen PHQ 2/9  Decreased Interest 0 0 0  Down, Depressed, Hopeless 0 0 0  PHQ - 2 Score 0 0 0  Altered sleeping  0 0  Tired, decreased energy  0 0  Change in appetite  0 0  Feeling bad or failure about yourself   0 0  Trouble concentrating  0 0  Moving slowly or fidgety/restless  0 0  Suicidal thoughts  0 0  PHQ-9 Score  0  0   Difficult doing work/chores  Not difficult at all Not difficult at all     Data saved with a previous flowsheet row definition      10/30/2023   11:20 AM 05/29/2023    9:46 AM 02/12/2023    1:20 PM 11/28/2022    9:32 AM  GAD 7 : Generalized Anxiety Score  Nervous, Anxious, on Edge 1 0 1 2  Control/stop worrying 0 0 0 1  Worry too much - different things 0 0 0 1  Trouble relaxing 0 0 0 1  Restless 1 0 0 1  Easily annoyed or irritable 0 0 0 0  Afraid - awful might happen 0 0 0 0  Total GAD 7 Score  2 0 1 6  Anxiety Difficulty Not difficult at all Not difficult at all Not difficult at all Not difficult at all    No results found for this or any previous visit (from the past 2160 hours).   Assessment/ Plan: Angela Johnson here for annual physical exam.   Annual physical exam - Plan: Measles/Mumps/Rubella Immunity, QuantiFERON-TB Gold Plus  Post viral syndrome - Plan: methylPREDNISolone  acetate (DEPO-MEDROL ) injection 40 mg  Essential hypertension - Plan: amLODipine  (NORVASC ) 5 MG tablet, CMP14+EGFR  Hashimoto's thyroiditis - Plan: levothyroxine  (SYNTHROID ) 25 MCG tablet, CMP14+EGFR, TSH + free T4  Vitamin D  deficiency - Plan: Vitamin D , Ergocalciferol , (DRISDOL ) 1.25 MG (50000 UNIT) CAPS capsule, CMP14+EGFR, VITAMIN D  25 Hydroxy (Vit-D Deficiency, Fractures)  Hypertriglyceridemia - Plan: CMP14+EGFR, Lipid panel  Vitamin B12 deficiency - Plan: CBC, CMP14+EGFR, Vitamin B12  Hx of gastric bypass - Plan: Vitamin D , Ergocalciferol , (DRISDOL ) 1.25 MG (50000 UNIT) CAPS capsule  Depressive disorder - Plan: busPIRone  (BUSPAR ) 15 MG tablet  Generalized anxiety disorder with panic attacks - Plan: citalopram  (CELEXA ) 40 MG tablet  History of measles, mumps, rubella (MMR) vaccination unknown - Plan: Measles/Mumps/Rubella Immunity  Screening-pulmonary TB - Plan: QuantiFERON-TB Gold Plus  Encounter for hepatitis C screening test for low risk patient - Plan: Hepatitis C antibody   Titers collected along with her nonfasting labs today.  She was given a Depo-Medrol  shot for postviral symptoms.  Blood pressure is controlled.  No changes.  Continue vitamin D , B12 etc. after gastric bypass.  Keep follow-up with surgeon.  Celexa  renewed.  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  She will get this ready for the patient once we get her titers back  Counseled on healthy lifestyle choices, including diet (rich in fruits,  vegetables and lean meats and low in salt and simple carbohydrates) and  exercise (at least 30 minutes of moderate physical activity daily).  Patient to follow up 1 year  Cristalle Rohm M. Amaya Blakeman, DO         [1]  Current Outpatient Medications:    albuterol  (VENTOLIN  HFA) 108 (90 Base) MCG/ACT inhaler, Inhale 2 puffs into the lungs every 6 (six) hours as needed for wheezing or shortness of breath., Disp: 18 g, Rfl: 0   CALCIUM PO, Take 500 mg by mouth in the morning, at noon, and at bedtime., Disp: , Rfl:    cetirizine (ZYRTEC) 10 MG tablet, Take 10 mg by mouth daily. allergies , Disp: , Rfl:    fluticasone  (FLONASE ) 50 MCG/ACT nasal spray, Place 2 sprays into both nostrils daily., Disp: 16 g, Rfl: 6   Multiple Vitamin (MULTI-VITAMIN) tablet, Take 1 tablet by mouth daily., Disp: , Rfl:    norgestrel-ethinyl estradiol (LO/OVRAL,CRYSELLE) 0.3-30 MG-MCG tablet, Take 1 tablet by mouth daily., Disp: , Rfl:    ondansetron  (ZOFRAN -ODT) 4 MG disintegrating tablet, Take 1 tablet (4 mg total) by mouth every 6 (six) hours as needed for nausea or vomiting., Disp: 20 tablet, Rfl: 0   amLODipine  (NORVASC ) 5 MG tablet, Take 1 tablet (5 mg total) by mouth daily. For hypertension, Disp: 90 tablet, Rfl: 4   busPIRone  (BUSPAR ) 15 MG tablet, Take 1 tablet (15 mg total) by mouth 2 (two) times daily., Disp: 180 tablet, Rfl: 3   citalopram  (CELEXA ) 40 MG tablet, Take 1 tablet (40 mg total) by mouth daily., Disp: 90 tablet, Rfl: 3   levothyroxine  (SYNTHROID ) 25 MCG tablet, Take 1 tablet (25 mcg total) by mouth daily., Disp: 90 tablet, Rfl: 3   Vitamin D , Ergocalciferol , (DRISDOL ) 1.25 MG (50000 UNIT) CAPS capsule, Take 1 capsule (50,000 Units total) by mouth every 7 (seven) days., Disp: 12 capsule, Rfl: 3  Current Facility-Administered Medications:    methylPREDNISolone  acetate (DEPO-MEDROL ) injection 40 mg, 40 mg, Intramuscular, Once,  [2]  Allergies Allergen Reactions   Ciprofloxacin  Hcl Itching   Doxycycline  Other (See Comments)    Was told not to take after gastric bypass    Latex Itching    Power   Zithromax  [Azithromycin ]     dizziness   "

## 2024-04-16 LAB — CMP14+EGFR
ALT: 22 IU/L (ref 0–32)
AST: 19 IU/L (ref 0–40)
Albumin: 4.4 g/dL (ref 3.9–4.9)
Alkaline Phosphatase: 71 IU/L (ref 41–116)
BUN/Creatinine Ratio: 16 (ref 9–23)
BUN: 12 mg/dL (ref 6–20)
Bilirubin Total: 0.3 mg/dL (ref 0.0–1.2)
CO2: 22 mmol/L (ref 20–29)
Calcium: 8.7 mg/dL (ref 8.7–10.2)
Chloride: 103 mmol/L (ref 96–106)
Creatinine, Ser: 0.75 mg/dL (ref 0.57–1.00)
Globulin, Total: 2.6 g/dL (ref 1.5–4.5)
Glucose: 81 mg/dL (ref 70–99)
Potassium: 4.1 mmol/L (ref 3.5–5.2)
Sodium: 140 mmol/L (ref 134–144)
Total Protein: 7 g/dL (ref 6.0–8.5)
eGFR: 104 mL/min/1.73

## 2024-04-16 LAB — LIPID PANEL
Chol/HDL Ratio: 4.8 ratio — ABNORMAL HIGH (ref 0.0–4.4)
Cholesterol, Total: 220 mg/dL — ABNORMAL HIGH (ref 100–199)
HDL: 46 mg/dL
LDL Chol Calc (NIH): 138 mg/dL — ABNORMAL HIGH (ref 0–99)
Triglycerides: 202 mg/dL — ABNORMAL HIGH (ref 0–149)
VLDL Cholesterol Cal: 36 mg/dL (ref 5–40)

## 2024-04-16 LAB — VITAMIN D 25 HYDROXY (VIT D DEFICIENCY, FRACTURES): Vit D, 25-Hydroxy: 18.4 ng/mL — ABNORMAL LOW (ref 30.0–100.0)

## 2024-04-16 LAB — CBC
Hematocrit: 39.5 % (ref 34.0–46.6)
Hemoglobin: 13 g/dL (ref 11.1–15.9)
MCH: 29.7 pg (ref 26.6–33.0)
MCHC: 32.9 g/dL (ref 31.5–35.7)
MCV: 90 fL (ref 79–97)
Platelets: 244 x10E3/uL (ref 150–450)
RBC: 4.38 x10E6/uL (ref 3.77–5.28)
RDW: 13.1 % (ref 11.7–15.4)
WBC: 8.2 x10E3/uL (ref 3.4–10.8)

## 2024-04-16 LAB — VITAMIN B12: Vitamin B-12: 357 pg/mL (ref 232–1245)

## 2024-04-16 LAB — TSH+FREE T4
Free T4: 0.91 ng/dL (ref 0.82–1.77)
TSH: 3.23 u[IU]/mL (ref 0.450–4.500)

## 2024-04-18 MED ORDER — LEVOTHYROXINE SODIUM 50 MCG PO TABS: 50.0000 ug | ORAL_TABLET | Freq: Every day | ORAL | 1 refills | Status: AC

## 2024-04-18 NOTE — Addendum Note (Signed)
 Addended by: JOLINDA NORENE HERO on: 04/18/2024 08:25 AM   Modules accepted: Orders

## 2024-04-19 LAB — QUANTIFERON-TB GOLD PLUS
QuantiFERON Mitogen Value: >10 [IU]/mL
QuantiFERON Nil Value: 0.07 [IU]/mL
QuantiFERON TB1 Ag Value: 0.06 [IU]/mL
QuantiFERON TB2 Ag Value: 0.06 [IU]/mL

## 2024-04-19 LAB — HEPATITIS C ANTIBODY: Hep C Virus Ab: NONREACTIVE

## 2024-04-19 LAB — MEASLES/MUMPS/RUBELLA IMMUNITY
MUMPS ABS, IGG: 96.6 [AU]/ml
RUBEOLA AB, IGG: 93.1 [AU]/ml
Rubella Antibodies, IGG: 1.9 {index}

## 2024-08-12 ENCOUNTER — Encounter: Admitting: Family Medicine
